# Patient Record
Sex: Female | Born: 1980
Health system: Southern US, Community
[De-identification: ages and names within clinical notes are randomized; demographics above are authoritative.]

## PROBLEM LIST (undated history)

## (undated) ENCOUNTER — Inpatient Hospital Stay (HOSPITAL_COMMUNITY): Payer: Self-pay

## (undated) DIAGNOSIS — K219 Gastro-esophageal reflux disease without esophagitis: Secondary | ICD-10-CM

## (undated) DIAGNOSIS — K140 Glossitis: Secondary | ICD-10-CM

## (undated) DIAGNOSIS — L271 Localized skin eruption due to drugs and medicaments taken internally: Secondary | ICD-10-CM

## (undated) DIAGNOSIS — Z9889 Other specified postprocedural states: Secondary | ICD-10-CM

## (undated) DIAGNOSIS — K760 Fatty (change of) liver, not elsewhere classified: Secondary | ICD-10-CM

## (undated) DIAGNOSIS — E039 Hypothyroidism, unspecified: Secondary | ICD-10-CM

## (undated) DIAGNOSIS — K5790 Diverticulosis of intestine, part unspecified, without perforation or abscess without bleeding: Secondary | ICD-10-CM

## (undated) DIAGNOSIS — K635 Polyp of colon: Secondary | ICD-10-CM

## (undated) DIAGNOSIS — R112 Nausea with vomiting, unspecified: Secondary | ICD-10-CM

## (undated) DIAGNOSIS — K222 Esophageal obstruction: Secondary | ICD-10-CM

## (undated) DIAGNOSIS — R590 Localized enlarged lymph nodes: Secondary | ICD-10-CM

## (undated) DIAGNOSIS — K449 Diaphragmatic hernia without obstruction or gangrene: Secondary | ICD-10-CM

## (undated) DIAGNOSIS — R000 Tachycardia, unspecified: Secondary | ICD-10-CM

## (undated) HISTORY — DX: Polyp of colon: K63.5

## (undated) HISTORY — DX: Diaphragmatic hernia without obstruction or gangrene: K44.9

## (undated) HISTORY — PX: TONGUE BIOPSY: SHX1075

## (undated) HISTORY — PX: CHOLECYSTECTOMY: SHX55

## (undated) HISTORY — PX: FOOT SURGERY: SHX648

## (undated) HISTORY — DX: Esophageal obstruction: K22.2

## (undated) HISTORY — PX: TONSILLECTOMY: SUR1361

## (undated) HISTORY — DX: Diverticulosis of intestine, part unspecified, without perforation or abscess without bleeding: K57.90

## (undated) HISTORY — DX: Hypothyroidism, unspecified: E03.9

## (undated) HISTORY — DX: Tachycardia, unspecified: R00.0

## (undated) HISTORY — DX: Localized enlarged lymph nodes: R59.0

---

## 2004-06-01 ENCOUNTER — Ambulatory Visit: Payer: Self-pay | Admitting: Family Medicine

## 2004-06-17 ENCOUNTER — Ambulatory Visit: Payer: Self-pay | Admitting: Cardiology

## 2004-09-30 ENCOUNTER — Ambulatory Visit: Payer: Self-pay | Admitting: Family Medicine

## 2004-10-04 ENCOUNTER — Ambulatory Visit: Payer: Self-pay | Admitting: Endocrinology

## 2004-10-07 ENCOUNTER — Ambulatory Visit (HOSPITAL_COMMUNITY): Admission: RE | Admit: 2004-10-07 | Discharge: 2004-10-07 | Payer: Self-pay | Admitting: Endocrinology

## 2004-10-11 ENCOUNTER — Encounter: Admission: RE | Admit: 2004-10-11 | Discharge: 2004-10-11 | Payer: Self-pay | Admitting: Family Medicine

## 2005-01-04 ENCOUNTER — Ambulatory Visit: Payer: Self-pay | Admitting: Family Medicine

## 2005-04-06 ENCOUNTER — Other Ambulatory Visit: Admission: RE | Admit: 2005-04-06 | Discharge: 2005-04-06 | Payer: Self-pay | Admitting: Gynecology

## 2005-05-09 ENCOUNTER — Encounter (INDEPENDENT_AMBULATORY_CARE_PROVIDER_SITE_OTHER): Payer: Self-pay | Admitting: *Deleted

## 2005-05-09 ENCOUNTER — Emergency Department (HOSPITAL_COMMUNITY): Admission: EM | Admit: 2005-05-09 | Discharge: 2005-05-09 | Payer: Self-pay | Admitting: Emergency Medicine

## 2005-05-16 ENCOUNTER — Ambulatory Visit: Payer: Self-pay | Admitting: Internal Medicine

## 2005-05-18 ENCOUNTER — Ambulatory Visit: Payer: Self-pay | Admitting: Internal Medicine

## 2005-05-23 ENCOUNTER — Ambulatory Visit (HOSPITAL_COMMUNITY): Admission: RE | Admit: 2005-05-23 | Discharge: 2005-05-23 | Payer: Self-pay | Admitting: Internal Medicine

## 2005-05-23 ENCOUNTER — Encounter (INDEPENDENT_AMBULATORY_CARE_PROVIDER_SITE_OTHER): Payer: Self-pay | Admitting: *Deleted

## 2005-05-30 ENCOUNTER — Ambulatory Visit: Payer: Self-pay | Admitting: Cardiology

## 2005-05-30 ENCOUNTER — Encounter (INDEPENDENT_AMBULATORY_CARE_PROVIDER_SITE_OTHER): Payer: Self-pay | Admitting: *Deleted

## 2005-07-27 ENCOUNTER — Ambulatory Visit: Payer: Self-pay | Admitting: Internal Medicine

## 2005-09-01 ENCOUNTER — Ambulatory Visit: Payer: Self-pay | Admitting: Cardiology

## 2005-09-05 ENCOUNTER — Encounter (INDEPENDENT_AMBULATORY_CARE_PROVIDER_SITE_OTHER): Payer: Self-pay | Admitting: *Deleted

## 2005-09-05 ENCOUNTER — Ambulatory Visit (HOSPITAL_COMMUNITY): Admission: RE | Admit: 2005-09-05 | Discharge: 2005-09-05 | Payer: Self-pay | Admitting: General Surgery

## 2006-03-23 ENCOUNTER — Ambulatory Visit: Payer: Self-pay | Admitting: Family Medicine

## 2006-04-26 ENCOUNTER — Ambulatory Visit: Payer: Self-pay | Admitting: Family Medicine

## 2006-05-12 ENCOUNTER — Emergency Department (HOSPITAL_COMMUNITY): Admission: AD | Admit: 2006-05-12 | Discharge: 2006-05-12 | Payer: Self-pay | Admitting: Family Medicine

## 2006-07-11 ENCOUNTER — Ambulatory Visit: Payer: Self-pay | Admitting: Family Medicine

## 2006-07-27 ENCOUNTER — Ambulatory Visit: Payer: Self-pay | Admitting: Endocrinology

## 2006-10-02 ENCOUNTER — Ambulatory Visit: Payer: Self-pay | Admitting: Family Medicine

## 2006-11-02 ENCOUNTER — Ambulatory Visit: Payer: Self-pay | Admitting: Family Medicine

## 2006-11-30 ENCOUNTER — Encounter (INDEPENDENT_AMBULATORY_CARE_PROVIDER_SITE_OTHER): Payer: Self-pay | Admitting: *Deleted

## 2006-11-30 ENCOUNTER — Emergency Department (HOSPITAL_COMMUNITY): Admission: EM | Admit: 2006-11-30 | Discharge: 2006-11-30 | Payer: Self-pay | Admitting: Family Medicine

## 2007-02-28 DIAGNOSIS — E039 Hypothyroidism, unspecified: Secondary | ICD-10-CM

## 2007-03-28 ENCOUNTER — Encounter: Payer: Self-pay | Admitting: *Deleted

## 2007-03-28 DIAGNOSIS — K219 Gastro-esophageal reflux disease without esophagitis: Secondary | ICD-10-CM | POA: Insufficient documentation

## 2007-03-28 DIAGNOSIS — R000 Tachycardia, unspecified: Secondary | ICD-10-CM | POA: Insufficient documentation

## 2007-04-15 ENCOUNTER — Ambulatory Visit: Payer: Self-pay | Admitting: Family Medicine

## 2007-08-25 ENCOUNTER — Emergency Department (HOSPITAL_COMMUNITY): Admission: EM | Admit: 2007-08-25 | Discharge: 2007-08-25 | Payer: Self-pay | Admitting: Emergency Medicine

## 2007-10-31 ENCOUNTER — Emergency Department (HOSPITAL_COMMUNITY): Admission: EM | Admit: 2007-10-31 | Discharge: 2007-10-31 | Payer: Self-pay | Admitting: Emergency Medicine

## 2008-04-13 ENCOUNTER — Encounter (INDEPENDENT_AMBULATORY_CARE_PROVIDER_SITE_OTHER): Payer: Self-pay | Admitting: *Deleted

## 2008-04-13 ENCOUNTER — Encounter: Admission: RE | Admit: 2008-04-13 | Discharge: 2008-04-13 | Payer: Self-pay | Admitting: Family Medicine

## 2009-02-09 ENCOUNTER — Ambulatory Visit: Payer: Self-pay | Admitting: Family Medicine

## 2009-02-12 LAB — CONVERTED CEMR LAB
Alkaline Phosphatase: 69 units/L (ref 39–117)
BUN: 10 mg/dL (ref 6–23)
Basophils Relative: 0.2 % (ref 0.0–3.0)
Bilirubin, Direct: 0.2 mg/dL (ref 0.0–0.3)
CO2: 26 meq/L (ref 19–32)
Calcium: 9.2 mg/dL (ref 8.4–10.5)
Chloride: 109 meq/L (ref 96–112)
Creatinine, Ser: 0.8 mg/dL (ref 0.4–1.2)
Eosinophils Relative: 2.6 % (ref 0.0–5.0)
GFR calc non Af Amer: 91.73 mL/min (ref >60)
Lymphs Abs: 1.6 10*3/uL (ref 0.7–4.0)
MCV: 91 fL (ref 78.0–100.0)
Monocytes Relative: 6.6 % (ref 3.0–12.0)
Neutro Abs: 5.2 10*3/uL (ref 1.4–7.7)
Sodium: 142 meq/L (ref 135–145)
TSH: 3.81 microintl units/mL (ref 0.35–5.50)
Total Bilirubin: 0.5 mg/dL (ref 0.3–1.2)

## 2009-03-19 ENCOUNTER — Telehealth: Payer: Self-pay | Admitting: Family Medicine

## 2009-04-19 ENCOUNTER — Telehealth: Payer: Self-pay | Admitting: Family Medicine

## 2009-05-12 ENCOUNTER — Emergency Department (HOSPITAL_COMMUNITY): Admission: EM | Admit: 2009-05-12 | Discharge: 2009-05-12 | Payer: Self-pay | Admitting: Family Medicine

## 2009-05-18 ENCOUNTER — Telehealth: Payer: Self-pay | Admitting: Family Medicine

## 2009-05-21 ENCOUNTER — Ambulatory Visit: Payer: Self-pay | Admitting: Internal Medicine

## 2009-05-21 DIAGNOSIS — R197 Diarrhea, unspecified: Secondary | ICD-10-CM | POA: Insufficient documentation

## 2009-05-21 DIAGNOSIS — R1084 Generalized abdominal pain: Secondary | ICD-10-CM

## 2009-05-21 DIAGNOSIS — K625 Hemorrhage of anus and rectum: Secondary | ICD-10-CM

## 2009-05-21 DIAGNOSIS — R1013 Epigastric pain: Secondary | ICD-10-CM | POA: Insufficient documentation

## 2009-05-21 LAB — CONVERTED CEMR LAB
IgA: 131 mg/dL (ref 68–378)
Tissue Transglutaminase Ab, IgA: 0.3 units (ref <7)

## 2009-06-08 ENCOUNTER — Ambulatory Visit: Payer: Self-pay | Admitting: Internal Medicine

## 2009-06-08 HISTORY — PX: COLONOSCOPY W/ POLYPECTOMY: SHX1380

## 2009-06-08 HISTORY — PX: UPPER GI ENDOSCOPY: SHX6162

## 2009-06-11 ENCOUNTER — Encounter: Payer: Self-pay | Admitting: Internal Medicine

## 2009-06-23 ENCOUNTER — Emergency Department (HOSPITAL_COMMUNITY): Admission: EM | Admit: 2009-06-23 | Discharge: 2009-06-23 | Payer: Self-pay | Admitting: Family Medicine

## 2009-07-08 ENCOUNTER — Ambulatory Visit: Payer: Self-pay | Admitting: Family Medicine

## 2009-07-08 DIAGNOSIS — R21 Rash and other nonspecific skin eruption: Secondary | ICD-10-CM | POA: Insufficient documentation

## 2009-07-08 DIAGNOSIS — R609 Edema, unspecified: Secondary | ICD-10-CM

## 2009-07-13 ENCOUNTER — Emergency Department (HOSPITAL_COMMUNITY): Admission: EM | Admit: 2009-07-13 | Discharge: 2009-07-13 | Payer: Self-pay | Admitting: Emergency Medicine

## 2009-07-14 LAB — CONVERTED CEMR LAB
Anti Nuclear Antibody(ANA): NEGATIVE
BUN: 8 mg/dL (ref 6–23)
Bilirubin, Direct: 0 mg/dL (ref 0.0–0.3)
CO2: 23 meq/L (ref 19–32)
Chloride: 110 meq/L (ref 96–112)
Eosinophils Absolute: 0.2 10*3/uL (ref 0.0–0.7)
HCT: 37.9 % (ref 36.0–46.0)
Lymphs Abs: 2 10*3/uL (ref 0.7–4.0)
MCV: 88.8 fL (ref 78.0–100.0)
Monocytes Absolute: 0.7 10*3/uL (ref 0.1–1.0)
Neutrophils Relative %: 68.1 % (ref 43.0–77.0)
Potassium: 3.4 meq/L — ABNORMAL LOW (ref 3.5–5.1)
RDW: 13 % (ref 11.5–14.6)
Sed Rate: 15 mm/hr (ref 0–22)
Sodium: 141 meq/L (ref 135–145)
TSH: 2.71 microintl units/mL (ref 0.35–5.50)
Total Bilirubin: 0.6 mg/dL (ref 0.3–1.2)
Total Protein: 7.1 g/dL (ref 6.0–8.3)
ds DNA Ab: <1 (ref <5)

## 2009-07-20 ENCOUNTER — Ambulatory Visit: Payer: Self-pay | Admitting: Family Medicine

## 2009-07-20 DIAGNOSIS — E538 Deficiency of other specified B group vitamins: Secondary | ICD-10-CM | POA: Insufficient documentation

## 2009-07-21 ENCOUNTER — Ambulatory Visit: Payer: Self-pay | Admitting: Internal Medicine

## 2009-07-21 DIAGNOSIS — K5730 Diverticulosis of large intestine without perforation or abscess without bleeding: Secondary | ICD-10-CM | POA: Insufficient documentation

## 2009-07-21 DIAGNOSIS — K222 Esophageal obstruction: Secondary | ICD-10-CM

## 2009-07-21 DIAGNOSIS — K589 Irritable bowel syndrome without diarrhea: Secondary | ICD-10-CM

## 2009-07-30 ENCOUNTER — Emergency Department (HOSPITAL_COMMUNITY): Admission: EM | Admit: 2009-07-30 | Discharge: 2009-07-30 | Payer: Self-pay | Admitting: Emergency Medicine

## 2009-08-16 ENCOUNTER — Emergency Department (HOSPITAL_COMMUNITY): Admission: EM | Admit: 2009-08-16 | Discharge: 2009-08-16 | Payer: Self-pay | Admitting: Family Medicine

## 2009-09-02 DIAGNOSIS — R002 Palpitations: Secondary | ICD-10-CM

## 2009-09-03 ENCOUNTER — Ambulatory Visit: Payer: Self-pay

## 2009-09-03 ENCOUNTER — Ambulatory Visit: Payer: Self-pay | Admitting: Cardiology

## 2009-09-03 DIAGNOSIS — R0602 Shortness of breath: Secondary | ICD-10-CM | POA: Insufficient documentation

## 2009-09-20 ENCOUNTER — Telehealth: Payer: Self-pay | Admitting: Cardiology

## 2009-10-04 ENCOUNTER — Encounter: Payer: Self-pay | Admitting: Cardiology

## 2009-10-04 ENCOUNTER — Ambulatory Visit: Payer: Self-pay | Admitting: Internal Medicine

## 2009-10-04 ENCOUNTER — Ambulatory Visit: Payer: Self-pay

## 2009-10-04 ENCOUNTER — Ambulatory Visit (HOSPITAL_COMMUNITY): Admission: RE | Admit: 2009-10-04 | Discharge: 2009-10-04 | Payer: Self-pay | Admitting: Cardiology

## 2009-10-11 ENCOUNTER — Ambulatory Visit: Payer: Self-pay | Admitting: Cardiology

## 2009-10-11 DIAGNOSIS — R0989 Other specified symptoms and signs involving the circulatory and respiratory systems: Secondary | ICD-10-CM

## 2009-10-11 DIAGNOSIS — R0609 Other forms of dyspnea: Secondary | ICD-10-CM

## 2009-10-18 ENCOUNTER — Telehealth: Payer: Self-pay | Admitting: Cardiology

## 2009-10-19 ENCOUNTER — Encounter (INDEPENDENT_AMBULATORY_CARE_PROVIDER_SITE_OTHER): Payer: Self-pay | Admitting: *Deleted

## 2009-11-05 ENCOUNTER — Encounter (INDEPENDENT_AMBULATORY_CARE_PROVIDER_SITE_OTHER): Payer: Self-pay | Admitting: *Deleted

## 2009-11-30 ENCOUNTER — Ambulatory Visit: Payer: Self-pay | Admitting: Cardiology

## 2009-11-30 ENCOUNTER — Ambulatory Visit (HOSPITAL_COMMUNITY): Admission: RE | Admit: 2009-11-30 | Discharge: 2009-11-30 | Payer: Self-pay | Admitting: Cardiology

## 2009-12-03 ENCOUNTER — Telehealth: Payer: Self-pay | Admitting: Cardiology

## 2009-12-08 ENCOUNTER — Telehealth: Payer: Self-pay | Admitting: Internal Medicine

## 2010-01-24 ENCOUNTER — Ambulatory Visit: Payer: Self-pay | Admitting: Family Medicine

## 2010-01-25 LAB — CONVERTED CEMR LAB
ALT: 50 units/L — ABNORMAL HIGH (ref 0–35)
Albumin: 4 g/dL (ref 3.5–5.2)
Basophils Absolute: 0.1 10*3/uL (ref 0.0–0.1)
CO2: 26 meq/L (ref 19–32)
Calcium: 8.9 mg/dL (ref 8.4–10.5)
Chloride: 105 meq/L (ref 96–112)
Cholesterol: 270 mg/dL — ABNORMAL HIGH (ref 0–200)
Creatinine, Ser: 0.9 mg/dL (ref 0.4–1.2)
Eosinophils Absolute: 0.2 10*3/uL (ref 0.0–0.7)
GFR calc non Af Amer: 81.59 mL/min (ref >60)
Glucose, Bld: 98 mg/dL (ref 70–99)
HCT: 44.1 % (ref 36.0–46.0)
Hemoglobin: 15.4 g/dL — ABNORMAL HIGH (ref 12.0–15.0)
Lymphocytes Relative: 32 % (ref 12.0–46.0)
Lymphs Abs: 2.5 10*3/uL (ref 0.7–4.0)
Neutro Abs: 4.3 10*3/uL (ref 1.4–7.7)
Neutrophils Relative %: 55.7 % (ref 43.0–77.0)
Potassium: 4 meq/L (ref 3.5–5.1)
RBC: 5.02 M/uL (ref 3.87–5.11)
Sodium: 138 meq/L (ref 135–145)
Total Bilirubin: 0.4 mg/dL (ref 0.3–1.2)
Total CHOL/HDL Ratio: 5
Vitamin B-12: 519 pg/mL (ref 211–911)

## 2010-05-10 ENCOUNTER — Telehealth: Payer: Self-pay | Admitting: Family Medicine

## 2010-07-24 ENCOUNTER — Encounter: Payer: Self-pay | Admitting: Cardiology

## 2010-07-24 ENCOUNTER — Encounter: Payer: Self-pay | Admitting: Endocrinology

## 2010-07-24 ENCOUNTER — Encounter: Payer: Self-pay | Admitting: Family Medicine

## 2010-07-31 LAB — CONVERTED CEMR LAB
BUN: 13 mg/dL (ref 6–23)
Chloride: 105 meq/L (ref 96–112)
Creatinine, Ser: 0.82 mg/dL (ref 0.40–1.20)
Potassium: 3.9 meq/L (ref 3.5–5.3)
Sodium: 140 meq/L (ref 135–145)

## 2010-08-02 NOTE — Assessment & Plan Note (Signed)
Summary: leg pain with rash/ccm   Vital Signs:  Patient profile:   30 year old female Temp:     97.8 degrees F oral Pulse rate:   84 / minute BP sitting:   122 / 80  (left arm) Cuff size:   large  Vitals Entered By: Alfred Levins, CMA (July 08, 2009 9:27 AM)  Contraindications/Deferment of Procedures/Staging:    Test/Procedure: Weight Refused    Reason for deferment: patient declined-cannot calculate BMI  CC: rash on lower legs that is swollen and painful   Current Medications (verified): 1)  Levothyroxine Sodium 100 Mcg Tabs (Levothyroxine Sodium) .... Once Daily 2)  Bcp .Marland Kitchen.. 1 By Mouth Once Daily 3)  Fexofenadine Hcl 180 Mg Tabs (Fexofenadine Hcl) .Marland Kitchen.. 1 Once Daily As Needed Allergies 4)  Cardizem Cd 240 Mg Xr24h-Cap (Diltiazem Hcl Coated Beads) .... Once Daily 5)  Metoprolol Succinate 25 Mg Xr24h-Tab (Metoprolol Succinate) .... Once Daily 6)  Omeprazole 40 Mg Cpdr (Omeprazole) .... Once Daily 7)  Temazepam 30 Mg Caps (Temazepam) .Marland Kitchen.. 1 At Bedtime Prn  Allergies (verified): 1)  ! Nexium (Esomeprazole Magnesium)   Impression & Recommendations:  Problem # 1:  EDEMA (ICD-782.3)  Her updated medication list for this problem includes:    Furosemide 40 Mg Tabs (Furosemide) ..... Once daily  Orders: Venipuncture ([address]) TLB-BMP (Basic Metabolic Panel-BMET) (80048-METABOL) TLB-CBC Platelet - w/Differential (85025-CBCD) TLB-Hepatic/Liver Function Pnl (80076-HEPATIC) TLB-TSH (Thyroid Stimulating Hormone) (84443-TSH) TLB-BNP (B-Natriuretic Peptide) (83880-BNPR) TLB-B12, Serum-Total ONLY (60454-U98) T-Antinuclear Antib (ANA) [phone]) T- * Misc. Laboratory test [phone]) Sedimentation Rate, non-automated (86578) TLB-Sedimentation Rate (ESR) (85652-ESR) Dermatology Referral (Derma)  Problem # 2:  TACHYCARDIA (ICD-785.0)  Problem # 3:  HYPOTHYROIDISM (ICD-244.9)  Her updated medication list for this problem includes:    Levothyroxine Sodium 100 Mcg Tabs  (Levothyroxine sodium) ..... Once daily  Problem # 4:  SKIN RASH (ICD-782.1)  Complete Medication List: 1)  Levothyroxine Sodium 100 Mcg Tabs (Levothyroxine sodium) .... Once daily 2)  Bcp  .Marland Kitchen.. 1 by mouth once daily 3)  Fexofenadine Hcl 180 Mg Tabs (Fexofenadine hcl) .Marland Kitchen.. 1 once daily as needed allergies 4)  Omeprazole 40 Mg Cpdr (Omeprazole) .... Once daily 5)  Temazepam 30 Mg Caps (Temazepam) .Marland Kitchen.. 1 at bedtime prn 6)  Toprol Xl 100 Mg Xr24h-tab (Metoprolol succinate) .... Once daily 7)  Furosemide 40 Mg Tabs (Furosemide) .... Once daily  Patient Instructions: 1)  I think the rash on her legs is simply petechial from the swelling, but she is very concerned about the possibility of lupus. We will check labs today, including a dsDNA, and refer her to Dermatology. Try to reduce the edema by stopping her calcium channel blocker and adding Lasix. Increase the Metroprolol so her heart rate will not shoot up. Elevate the legs when possible. Prescriptions: FUROSEMIDE 40 MG TABS (FUROSEMIDE) once daily  #30 x 5   Entered and Authorized by:   Nelwyn Salisbury MD   Signed by:   Nelwyn Salisbury MD on 07/08/2009   Method used:   [facility] [facility]       [address]       [place], [place]  [address]       Ph: [phone] or [phone]       Fax: [phone]   RxID:   [id number] TOPROL XL 100 MG XR24H-TAB (METOPROLOL SUCCINATE) once daily  #30 x 5   Entered and Authorized by:   Jeannett Senior  Marguerita Beards MD   Signed by:   Nelwyn Salisbury MD on 07/08/2009   Method used:   [facility]        [facility]  [address] (retail)       [address]       [place], [place]  [address]       Ph: [phone] or [phone]       Fax: [phone]   RxID:   [id number]

## 2010-08-02 NOTE — Letter (Signed)
Summary: Appointment - Cardiac MRI  [facility]     [place], [place]    Phone:   Fax:       October 19, 2009 MRN: [id number]   [facility] [address] [place], [place]  [address]   Dear Ms. Furnari,   We have scheduled the above patient for an appointment for a Cardiac MRI on April 27,2011 at 1:00 p.m.  Please refer to the below information for the location and instructions for this test:  Location:     [facility]       [address]       [place], [place]  [address] Instructions:    Wilmon Arms at [facility] Outpatient Registration 45 minutes prior to your appointment time.  This will ensure you are in the Radiology Department 30 minutes prior to your appointment.    There are no restrictions for this test you may eat and take medications as usual.  If you need to reschedule this appointment please call at the number listed above.   Sincerely,      Lorne Skeens  [facility] Scheduling Team

## 2010-08-02 NOTE — Assessment & Plan Note (Signed)
Summary: MED CK (REFILL) // RS   Vital Signs:  Patient profile:   30 year old female Pulse rate:   88 / minute Pulse rhythm:   regular BP sitting:   108 / 90  (left arm) Cuff size:   large  Vitals Entered By: Raechel Ache, RN (January 24, 2010 9:57 AM) CC: Med check.   History of Present Illness: Here to follow up low b12 and hypothyroidism. She feels good in general. She has lost 12 lbs. No complaints.   Allergies: 1)  ! Nexium (Esomeprazole Magnesium)  Past History:  Past Medical History: 1. PALPITATIONS (ICD-785.1):sees Dr. Marca Ancona.  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  She says that she had an EP study in [place] that was normal.  Holter monitor (3/11) on Toprol XL showed no significant arrhythmic events.  HR ranged 65-108 with mean 79.  2. DIVERTICULOSIS-COLON (ICD-562.10) 3. IRRITABLE BOWEL SYNDROME (ICD-564.1), sees Dr. Marina Goodell 4. VITAMIN B12 DEFICIENCY (ICD-266.2) 5. EDEMA (ICD-782.3) 6. GERD (ICD-530.81) 7. HYPOTHYROIDISM (ICD-244.9) 8. Dyspnea: Echo (4/11) was a difficult study due to body habitus but showed preserved LV systolic function, EF 55%, grade I diastolic dysfunction, possible inferior, inferoseptal, and apical hypokinesis. 9. Obesity    Review of Systems  The patient denies anorexia, fever, weight gain, vision loss, decreased hearing, hoarseness, chest pain, syncope, dyspnea on exertion, peripheral edema, prolonged cough, headaches, hemoptysis, abdominal pain, melena, hematochezia, severe indigestion/heartburn, hematuria, incontinence, genital sores, muscle weakness, suspicious skin lesions, transient blindness, difficulty walking, depression, unusual weight change, abnormal bleeding, enlarged lymph nodes, angioedema, breast masses, and testicular masses.    Physical Exam  General:  overweight-appearing.   Neck:  No deformities, masses, or tenderness noted. Lungs:  Normal  respiratory effort, chest expands symmetrically. Lungs are clear to auscultation, no crackles or wheezes. Heart:  Normal rate and regular rhythm. S1 and S2 normal without gallop, murmur, click, rub or other extra sounds.   Impression & Recommendations:  Problem # 1:  PALPITATIONS (ICD-785.1)  Her updated medication list for this problem includes:    Toprol Xl 100 Mg Xr24h-tab (Metoprolol succinate) ..... Once daily  Problem # 2:  VITAMIN B12 DEFICIENCY (ICD-266.2)  Orders: TLB-B12, Serum-Total ONLY ([address]-U04)  Problem # 3:  EDEMA (ICD-782.3)  The following medications were removed from the medication list:    Furosemide 40 Mg Tabs (Furosemide) ..... Once daily  Orders: Venipuncture ([address]) TLB-Lipid Panel (80061-LIPID) TLB-CBC Platelet - w/Differential (85025-CBCD) TLB-Hepatic/Liver Function Pnl (80076-HEPATIC) TLB-TSH (Thyroid Stimulating Hormone) (84443-TSH) TLB-BMP (Basic Metabolic Panel-BMET) (80048-METABOL)  Problem # 4:  HYPOTHYROIDISM (ICD-244.9)  Her updated medication list for this problem includes:    Levothyroxine Sodium 100 Mcg Tabs (Levothyroxine sodium) ..... Once daily  Complete Medication List: 1)  Levothyroxine Sodium 100 Mcg Tabs (Levothyroxine sodium) .... Once daily 2)  Fexofenadine Hcl 180 Mg Tabs (Fexofenadine hcl) .Marland Kitchen.. 1 once daily as needed allergies 3)  Omeprazole 40 Mg Cpdr (Omeprazole) .... Once daily 4)  Temazepam 30 Mg Caps (Temazepam) .Marland Kitchen.. 1 at bedtime prn 5)  Toprol Xl 100 Mg Xr24h-tab (Metoprolol succinate) .... Once daily 6)  Cyanocobalamin 1000 Mcg/ml Soln (Cyanocobalamin) .Marland Kitchen.. 1 ml intramuscular weekly for 3 mths then ov 7)  Librax 2.5-5 Mg Caps (Clidinium-chlordiazepoxide) .Marland Kitchen.. 1 by mouth ac and at bedtime as needed  Patient Instructions: 1)  Get fasting labs today Prescriptions: TOPROL XL 100 MG XR24H-TAB (METOPROLOL SUCCINATE) once daily  #90 x 3   Entered and Authorized  by:   Nelwyn Salisbury MD   Signed by:   Nelwyn Salisbury MD on  01/24/2010   Method used:   Print then Give to Patient   RxID:   [id number] TEMAZEPAM 30 MG CAPS (TEMAZEPAM) 1 at bedtime prn  #90 x 1   Entered and Authorized by:   Nelwyn Salisbury MD   Signed by:   Nelwyn Salisbury MD on 01/24/2010   Method used:   Print then Give to Patient   RxID:   [id number] OMEPRAZOLE 40 MG CPDR (OMEPRAZOLE) once daily  #90 x 3   Entered and Authorized by:   Nelwyn Salisbury MD   Signed by:   Nelwyn Salisbury MD on 01/24/2010   Method used:   Print then Give to Patient   RxID:   [id number] FEXOFENADINE HCL 180 MG TABS (FEXOFENADINE HCL) 1 once daily as needed allergies  #90 x 3   Entered and Authorized by:   Nelwyn Salisbury MD   Signed by:   Nelwyn Salisbury MD on 01/24/2010   Method used:   Print then Give to Patient   RxID:   [id number] LEVOTHYROXINE SODIUM 100 MCG TABS (LEVOTHYROXINE SODIUM) once daily  #90 x 3   Entered and Authorized by:   Nelwyn Salisbury MD   Signed by:   Nelwyn Salisbury MD on 01/24/2010   Method used:   Print then Give to Patient   RxID:   [id number]

## 2010-08-02 NOTE — Progress Notes (Signed)
Summary: PT RETURNING CALL   Phone Note Call from Patient Call back at [phone]   Caller: Patient Summary of Call: PT RETURNING CALL Initial call taken by: Judie Grieve,  September 20, 2009 8:05 AM  Follow-up for Phone Call        gave pt monitor results from 09-03-09

## 2010-08-02 NOTE — Procedures (Signed)
Summary: SUMMARY REPORT  SUMMARY REPORT   Imported By: Mirna Mires 09/20/2009 10:07:01  _____________________________________________________________________  External Attachment:    Type:   Image     Comment:   External Document

## 2010-08-02 NOTE — Assessment & Plan Note (Signed)
Summary: FOLLOWUP post ECL    History of Present Illness Visit Type: follow up Primary GI MD: Yancey Flemings MD Primary Provider: Gershon Crane, MD  Requesting Provider: n/a Chief Complaint: F/u endo/colon History of Present Illness:   30 year old female with hypothyroidism, unspecified arrhythmia for which she is on medical therapy, chronic allergies, tension headaches, GERD, and obesity. She was evaluated May 21, 2009 for nausea vomiting, bloating, cramping, and diarrhea. See that dictation. Serologic testing for celiac sprue was negative. Complete colonoscopy and upper endoscopy were performed December 7. Colonoscopy revealed diverticulosis. The colonic mucosa was normal. The ileum was normal. Random biopsies of the colon were normal. A diminutive colon polyp was hyperplastic. Upper endoscopy revealed a benign distal esophageal stricture and a small hiatal hernia. No other abnormalities. She presents today for followup here at review of outside laboratories from January 2011 revealed a normal CBC. Her hemoglobin was normal. Thyroid testing normal. Liver tests normal. However, vitamin B12 level was low. She is now on B12 replacement. Sedimentation rate normal. Overall she feels about the same. She does report some vague nausea and decreased appetite which are new over the past few weeks. No new problems additionally.   GI Review of Systems    Reports abdominal pain, belching, loss of appetite, nausea, and  vomiting.     Location of  Abdominal pain: cramping.    Denies acid reflux, bloating, chest pain, dysphagia with liquids, dysphagia with solids, heartburn, vomiting blood, weight loss, and  weight gain.      Reports diarrhea and  diverticulosis.     Denies anal fissure, black tarry stools, change in bowel habit, constipation, fecal incontinence, heme positive stool, hemorrhoids, irritable bowel syndrome, jaundice, light color stool, liver problems, rectal bleeding, and  rectal pain.     Current Medications (verified): 1)  Levothyroxine Sodium 100 Mcg Tabs (Levothyroxine Sodium) .... Once Daily 2)  Bcp .Marland Kitchen.. 1 By Mouth Once Daily 3)  Fexofenadine Hcl 180 Mg Tabs (Fexofenadine Hcl) .Marland Kitchen.. 1 Once Daily As Needed Allergies 4)  Omeprazole 40 Mg Cpdr (Omeprazole) .... Once Daily 5)  Temazepam 30 Mg Caps (Temazepam) .Marland Kitchen.. 1 At Bedtime Prn 6)  Toprol Xl 100 Mg Xr24h-Tab (Metoprolol Succinate) .... Once Daily 7)  Furosemide 40 Mg Tabs (Furosemide) .... Once Daily 8)  Cyanocobalamin 1000 Mcg/ml Soln (Cyanocobalamin) .Marland Kitchen.. 1 Ml Intramuscular Weekly For 3 Mths Then Ov  Allergies (verified): 1)  ! Nexium (Esomeprazole Magnesium)  Past History:  Past Medical History: Reviewed history from 02/09/2009 and no changes required. Hypothyroidism GERD chronic sinus tachycardia, sees Dr. Irena Cords for cardiology exams. Had Holter monitor 2009 (sinus tach only)         and ECHO 2009 (normal except trace mitral regurgitation)  tension HAs sees [facility] for exams  Past Surgical History: Reviewed history from 03/28/2007 and no changes required. Cholecystectomy  Family History: Reviewed history from 05/21/2009 and no changes required. Family History of CAD Female 1st degree relative <50 Family History High cholesterol Family History Hypertension Family History Thyroid disease Family History of Colon Cancer:MGGF Family History of Colon Polyps:Mother, MGM, MGGF Family History of Heart Disease: Mother, MGF  Social History: Reviewed history from 05/21/2009 and no changes required. Single No childern  Never Smoked Alcohol use-no Daily Caffeine Use: One daily  Occupation: RN at [employer] Illicit Drug Use - no Patient does not get regular exercise.   Review of Systems       unchanged from previous office visit.  Vital Signs:  Patient  profile:   30 year old female Height:      71 inches Pulse rate:   82 / minute Pulse rhythm:   regular BP sitting:   124 / 80  (left  arm) Cuff size:   large  Vitals Entered By: Ok Anis CMA (July 21, 2009 11:14 AM)  Physical Exam  General:  Well developed, well nourished, no acute distress. Head:  Normocephalic and atraumatic. Eyes:  PERRLA, no icterus. Ears:  Normal auditory acuity. Nose:  No deformity, discharge,  or lesions. Mouth:  No deformity or lesions, dentition normal. Lungs:  Clear throughout to auscultation. Heart:  Regular rate and rhythm; no murmurs, rubs,  or bruits. Abdomen:  Soft, obese, nontender and nondistended. No masses, hepatosplenomegaly or hernias noted. Normal bowel sounds. Msk:  Symmetrical with no gross deformities. Normal posture. Pulses:  Normal pulses noted. Extremities:  no edema Neurologic:  Alert and  oriented x4;  Skin:  Intact without significant lesions or rashes. Psych:  Alert and cooperative. Normal mood and affect.   Impression & Recommendations:  Problem # 1:  IRRITABLE BOWEL SYNDROME (ICD-564.1) The patient's chronic GI complaints are most likely a form of irritable bowel syndrome. Her B12 deficiency, without anemia, is interesting. No evidence of small bowel pathology grossly on recent endoscopy.  Plan: #1. Prescribed Librax one p.o. a.c. and h.s. p.r.n. #2.. Office followup in 6 weeks #3. Could consider more extensive small bowel evaluation or empiric trial of broad-spectrum antibiotics or pancreatic enzymes if symptoms persist (this given explain B12 deficiency).  Problem # 2:  DIVERTICULOSIS-COLON (ICD-562.10) newly diagnosed diverticulosis. She may have some lower intestinal spasm.  Plan: #1. High-fiber diet  Problem # 3:  GERD (ICD-530.81) ongoing. Classic GERD symptoms controlled with omeprazole.  Plan: #1. Continue omeprazole #2. Reflux precautions with attention to weight loss  Problem # 4:  ESOPHAGEAL STRICTURE (ICD-530.3) newly diagnosed esophageal stricture on recent endoscopy. Currently asymptomatic.  Plan #1. Continue omeprazole #2.  Esophageal dilation p.rn. If dysphagia occurs  Problem # 5:  VITAMIN B12 DEFICIENCY (ICD-266.2) Assessment: Comment Only  Weight was not documented today as patient declined; BMI could not be calculated.  Patient Instructions: 1)  Generic Librax sent to pharmacy #100 1 by mouth AC and at bedtime as needed  3 RFs. 2)  Please schedule a follow-up appointment in  6 weeks.  3)  The medication list was reviewed and reconciled.  All changed / newly prescribed medications were explained.  A complete medication list was provided to the patient / caregiver. 4)  Copy: Dr. Shellia Carwin Prescriptions: LIBRAX 2.5-5 MG CAPS (CLIDINIUM-CHLORDIAZEPOXIDE) 1 by mouth AC and at bedtime as needed  #100 x 3   Entered by:   Milford Cage NCMA   Authorized by:   Hilarie Fredrickson MD   Signed by:   Milford Cage NCMA on 07/21/2009   Method used:   [facility]  [address] [facility]       [address]       [place], [place]  [address]       Ph: [phone] or [phone]       Fax: [phone]   RxID:   [id number]

## 2010-08-02 NOTE — Progress Notes (Signed)
Summary: thyroid questions  Phone Note Call from Patient Call back at Home Phone [phone]   Caller: Patient Call For: Nelwyn Salisbury MD Summary of Call: TSH 1.1 at [facility] Needs instructions on meds. [facility] Initial call taken by: [facility] CMA AAMA,  May 10, 2010 3:29 PM  Follow-up for Phone Call        her Synthroid level is now exactly right. Continue current dose and recheck in 6 months  Follow-up by: Nelwyn Salisbury MD,  May 10, 2010 4:36 PM  Additional Follow-up for Phone Call Additional follow up Details #1::        left mess to return call  Additional Follow-up by: Pura Spice, RN,  May 10, 2010 4:39 PM     Appended Document: thyroid questions pt called above info given

## 2010-08-02 NOTE — Progress Notes (Signed)
Summary: pt not snoring at night 10-18-09   Phone Note Call from Patient Call back at [facility] Phone [phone]   Caller: Patient Reason for Call: Talk to Nurse Summary of Call: PT NOT SNORING AT NIGHT.  Initial call taken by: Lorne Skeens,  October 18, 2009 12:05 PM  Follow-up for Phone Call        LM for pt to call Katina Dung, RN, BSN  October 18, 2009 12:08 PM  talked with patient--pt's mother did not observe any snoring or breathing problems at night  or other indication for sleep apnea--will forward to Dr Shirlee Latch

## 2010-08-02 NOTE — Assessment & Plan Note (Signed)
Summary: B12 INJ // RS  Medications Added CYANOCOBALAMIN 1000 MCG/ML SOLN (CYANOCOBALAMIN) 1 mL intramuscular weekly for 3 mths then ov       Nurse Visit   Allergies: 1)  ! Nexium (Esomeprazole Magnesium)  Medication Administration  Injection # 1:    Medication: Vit B12 1000 mcg    Diagnosis: VITAMIN B12 DEFICIENCY (ICD-266.2)    Route: IM    Site: L deltoid    Exp Date: 4/12    Lot #: [id number]    Mfr: [employer]    Patient tolerated injection without complications    Given by: Alfred Levins, CMA (July 20, 2009 9:18 AM)  Orders Added: 1)  Vit B12 1000 mcg [J3420] 2)  Admin of Therapeutic Inj  intramuscular or subcutaneous [96372]  Prescriptions: CYANOCOBALAMIN 1000 MCG/ML SOLN (CYANOCOBALAMIN) 1 mL intramuscular weekly for 3 mths then ov  #1 x 3   Entered by:   Alfred Levins, CMA   Authorized by:   Nelwyn Salisbury MD   Signed by:   Alfred Levins, CMA on 07/20/2009   Method used:   [facility]        [facility]  [address]       [address]       [place], [place]  [address]       Ph: [phone] or [phone]       Fax: [phone]   RxID:   [id number]

## 2010-08-02 NOTE — Assessment & Plan Note (Signed)
Summary: f3w/post echo/lg  Medications Added TOPROL XL 100 MG XR24H-TAB (METOPROLOL SUCCINATE) one-half tablet twice a day      Allergies Added:   Referring Provider:  n/a Primary Provider:  Gershon Crane, MD   CC:  follow up on echo.  Pt states she is feeling pretty good but still having the SOB.  Pt. refuses weighing today.  Home weight this AM 260lbs.  Pt denies fluid on ankles or legs.  History of Present Illness: 30 yo with history of sinus tachycardia presents for evaluation of palpitations, shortness of breath, and leg edema.  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  She says that she had an EP study in [place] in 2003 that apparently was normal.  She was talking diltiazem CD in the past but developed significant leg swelling so this was stopped and she was begun on Toprol XL instead.  Swelling improved off diltiazem but was still significant so she has been taking Lasix.  If she stops Lasix, the swelling returns.  Lately, she has been having more fluttering and extra beats.  This occurs daily.  When the palpitations occur, she becomes short of breath.  She has had a lot of trouble with dyspnea.  Sometimes she is short of breath at rest.  It does not take much exertion to wear her out. A flight of steps gets her significantly out of breath.  No orthopnea or PND, though sometimes she will wake up with fluttering in her chest.  No lightheadedness or syncope.  She has generalized fatigue.   Holter monitor in 3/11 showed no significant arrhythmic events and average heart rate of 79. Echo was a difficult study likely due to body habitus. EF appeared grossly preserved but there was a question of possible inferior and inferoseptal wall motion abnormalities. Unable to estimate PA pressure. She also notes that her BP has been running lower since increasing Toprol XL.  It was once as low as 80/40. Finally, patient's mother says that she snores. She has not noted  her stop breathing or gasp in her sleep but she has not observed closely.   Labs (1/11): ANA negative, K 3.4, creatinine 1.0, BNP 25, TSH normal, ESR 15 (normal) Labs (3/11): K 3.9, creatinine 0.82  ECG: NSR, borderline left atrial enlargement.   Current Medications (verified): 1)  Levothyroxine Sodium 100 Mcg Tabs (Levothyroxine Sodium) .... Once Daily 2)  Bcp .Marland Kitchen.. 1 By Mouth Once Daily 3)  Fexofenadine Hcl 180 Mg Tabs (Fexofenadine Hcl) .Marland Kitchen.. 1 Once Daily As Needed Allergies 4)  Omeprazole 40 Mg Cpdr (Omeprazole) .... Once Daily 5)  Temazepam 30 Mg Caps (Temazepam) .Marland Kitchen.. 1 At Bedtime Prn 6)  Toprol Xl 100 Mg Xr24h-Tab (Metoprolol Succinate) .... One  Daily in The Morning and One-Half in The Evening 7)  Furosemide 40 Mg Tabs (Furosemide) .... Once Daily 8)  Cyanocobalamin 1000 Mcg/ml Soln (Cyanocobalamin) .Marland Kitchen.. 1 Ml Intramuscular Weekly For 3 Mths Then Ov 9)  Librax 2.5-5 Mg Caps (Clidinium-Chlordiazepoxide) .Marland Kitchen.. 1 By Mouth Ac and At Bedtime As Needed 10)  Potassium Chloride Crys Cr 20 Meq Cr-Tabs (Potassium Chloride Crys Cr) .... Take One Tablet By Mouth Daily  Allergies (verified): 1)  ! Nexium (Esomeprazole Magnesium)  Past History:  Past Medical History: 1. 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  She says that she had an EP study in [place] that was normal.  Holter monitor (3/11) on  Toprol XL showed no significant arrhythmic events.  HR ranged 65-108 with mean 79.  2. DIVERTICULOSIS-COLON (ICD-562.10) 3. IRRITABLE BOWEL SYNDROME (ICD-564.1) 4. VITAMIN B12 DEFICIENCY (ICD-266.2) 5. EDEMA (ICD-782.3) 6. GERD (ICD-530.81) 7. HYPOTHYROIDISM (ICD-244.9) 8. Dyspnea: Echo (4/11) was a difficult study due to body habitus but showed preserved LV systolic function, EF 55%, grade I diastolic dysfunction, possible inferior, inferoseptal, and apical hypokinesis. 9. Obesity    Family  History: Family History of CAD Female 1st degree relative <50, also grandmother had an MI in her mid-40s.  Family History High cholesterol Family History Hypertension Family History Thyroid disease Family History of Colon Cancer:MGGF Family History of Colon Polyps:Mother, MGM, MGGF Family History of Heart Disease: Mother, MGF  Social History: Reviewed history from 09/03/2009 and no changes required. 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   Review of Systems       All systems reviewed and negative except as per HPI.   Vital Signs:  Patient profile:   30 year old female Height:      71 inches Weight:      260 pounds BMI:     36.39 Pulse rate:   76 / minute Pulse rhythm:   regular BP sitting:   104 / 66  (left arm) Cuff size:   large  Vitals Entered By: Judithe Modest CMA (October 11, 2009 10:50 AM)  Physical Exam  General:  Well developed, well nourished, in no acute distress. Neck:  Neck thick, no JVD. No masses, thyromegaly or abnormal cervical nodes. Lungs:  Clear bilaterally to auscultation and percussion. Heart:  Non-displaced PMI, chest non-tender; regular rate and rhythm, S1, S2 without murmurs, rubs or gallops. Carotid upstroke normal, no bruit. Pedals normal pulses. No edema, no varicosities. Abdomen:  Bowel sounds positive; abdomen soft and non-tender without masses, organomegaly, or hernias noted. No hepatosplenomegaly. Extremities:  No clubbing or cyanosis. Neurologic:  Alert and oriented x 3. Psych:  Normal affect.   Impression & Recommendations:  Problem # 1:  PALPITATIONS (ICD-785.1) No significant arrhythmic events on holter, minimal sinus tachycardia.  Given occasional hypotension, I will have her decrease Toprol XL to 50 mg two times a day.   Problem # 2:  DYSPNEA ON EXERTION (ICD-786.09) Patient  has significant exertional dyspnea and generalized fatigue.  Some of this could certainly be due to obesity and deconditioning.  I also have some concern for OSA.  Echo was a difficult study due to body habitus which showed overall preserved EF but there was some question of inferior and apical wall motion abnormalities.  Unable to estimate PA systolic pressure.   - Mother is an RT, will watch her during her sleep one night to see if she gasps/stops breathing.  If she seems to have signs of significant obstruction, I will get a sleep study.  - I will get a cardiac MRI to better visualize the heart.  I will assess for LV wall motion abnormalities and will also assess RV size and function.  I will do the study of contrast so that we will be able to look for myocardial delayed enhancement in the event that she has true wall motion abnormalities.   - I will assess results of cardiac MRI.  I will probably get a right heart catheterization to confirm left and right heart filling pressures and to  look for pulmonary hypertension as a cause of her dyspnea.    Other Orders: Cardiac MRI (Cardiac MRI)  Patient Instructions: 1)  Your physician has recommended you make the following change in your medication:  2)  Decrease Toprol XL to 50mg  twice a day 3)  Your physician has requested that you have a cardiac MRI.  Cardiac MRI uses a computer to create images of your heart as it's beating, producing both still and moving pictures of your heart and major blood vessels. For further information please visit  [email or web address]  Please follow the instruction sheet given to you today for more information. 4)  Call Katina Dung in about 10days and let her know about any breathing problems/gasping at night. [phone] 5)  Your physician recommends that you schedule a follow-up appointment in: 2 months with Dr Shirlee Latch. Prescriptions: TOPROL XL 100 MG XR24H-TAB (METOPROLOL SUCCINATE) one-half tablet twice a day  #30 x  6   Entered by:   Katina Dung, RN, BSN   Authorized by:   Marca Ancona, MD   Signed by:   Katina Dung, RN, BSN on 10/11/2009   Method used:   [facility]        [facility]  [address] (retail[facility]       [address]       [place], [place]  [address]       Ph: [phone] or [phone]       Fax: [phone]   RxID:   [id number]

## 2010-08-02 NOTE — Letter (Signed)
Summary: Appointment - Cardiac MRI  [facility], Main Office  [address]   [place], [place] [address]   Phone: [phone]  Fax: [phone]      Nov 05, 2009 MRN: [id number]   Jill Baker [address] [place], [place]  [address]   Dear [name],   We have scheduled the above patient for an appointment for a Cardiac MRI on 11/30/2009 at 9:00am.  Please refer to the below information for the location and instructions for this test:  Location:     [facility]       [address]       [place], [place]  [address] Instructions:    Arrive at [facility] 45 minutes prior to your appointment time.  This will ensure you are in the Radiology Department 30 minutes prior to your appointment.    There are no restrictions for this test you may eat and take medications as usual.  If you need to reschedule this appointment please call at the number listed above.  Sincerely,  Migdalia Dk [facility] Scheduling Team

## 2010-08-02 NOTE — Progress Notes (Signed)
Summary: refill Temazepam  Phone Note Refill Request Message from:  Fax from Pharmacy on December 08, 2009 9:05 AM  Refills Requested: Medication #1:  TEMAZEPAM 30 MG CAPS 1 at bedtime prn   Dosage confirmed as above?Dosage Confirmed   Supply Requested: 1 month   Last Refilled: 09/12/2009  Method Requested: Fax to Local Pharmacy Initial call taken by: Raechel Ache, RN,  December 08, 2009 9:05 AM [facility]  [address] [id number]  Follow-up for Phone Call        can ok x 1  further refills per Dr Clent Ridges Follow-up by: Madelin Headings MD,  December 08, 2009 5:35 PM    Prescriptions: TEMAZEPAM 30 MG CAPS (TEMAZEPAM) 1 at bedtime prn  #30 x 0   Entered by:   Raechel Ache, RN   Authorized by:   Madelin Headings MD   Signed by:   Raechel Ache, RN on 12/09/2009   Method used:   Historical   RxID:   [id number]

## 2010-08-02 NOTE — Assessment & Plan Note (Signed)
Summary: ec6/ pt has umr/ gd  Medications Added TOPROL XL 100 MG XR24H-TAB (METOPROLOL SUCCINATE) one  daily in the morning and one-half in the evening POTASSIUM CHLORIDE CRYS CR 20 MEQ CR-TABS (POTASSIUM CHLORIDE CRYS CR) Take one tablet by mouth daily      Allergies Added:   Referring Provider:  n/a Primary Provider:  Gershon Crane, MD   CC:  sob/palpitations and fatigue.  Lightheadedness and chest pain.  reports this for the last few years on and off but progressively worse in the last 6 months.  History of Present Illness: 30 yo with history of sinus tachycardia presents for evaluation of palpitations, shortness of breath, and leg edema.  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  She says that she had an EP study in [place] in 2003 that apparently was normal.  She was talking diltiazem CD in the past but developed significant leg swelling so this was stopped and she was begun on Toprol XL instead.  Swelling improved off diltiazem but was still significant so she has been taking Lasix.  If she stops Lasix, the swelling returns.  Lately, she has been having more fluttering and extra beats.  This occurs daily.  When the palpitations occur, she becomes short of breath.  She has had a lot of trouble with dyspnea.  Sometimes she is short of breath at rest.  It does not take much exertion to wear her out.  No orthopnea or PND, though sometimes she will wake up with fluttering in her chest.  No lightheadedness or syncope.    Labs (1/11): ANA negative, K 3.4, creatinine 1.0, BNP 25, TSH normal, ESR 15 (normal)  ECG: NSR, borderline left atrial enlargement.   Current Medications (verified): 1)  Levothyroxine Sodium 100 Mcg Tabs (Levothyroxine Sodium) .... Once Daily 2)  Bcp .Marland Kitchen.. 1 By Mouth Once Daily 3)  Fexofenadine Hcl 180 Mg Tabs (Fexofenadine Hcl) .Marland Kitchen.. 1 Once Daily As Needed Allergies 4)  Omeprazole 40 Mg Cpdr (Omeprazole) .... Once Daily 5)  Temazepam 30  Mg Caps (Temazepam) .Marland Kitchen.. 1 At Bedtime Prn 6)  Toprol Xl 100 Mg Xr24h-Tab (Metoprolol Succinate) .... Once Daily 7)  Furosemide 40 Mg Tabs (Furosemide) .... Once Daily 8)  Cyanocobalamin 1000 Mcg/ml Soln (Cyanocobalamin) .Marland Kitchen.. 1 Ml Intramuscular Weekly For 3 Mths Then Ov 9)  Librax 2.5-5 Mg Caps (Clidinium-Chlordiazepoxide) .Marland Kitchen.. 1 By Mouth Ac and At Bedtime As Needed  Allergies (verified): 1)  ! Nexium (Esomeprazole Magnesium)  Past History:  Past Medical History: 1. 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  She says that she had an EP study in [place] that was normal.  2. DIVERTICULOSIS-COLON (ICD-562.10) 3. IRRITABLE BOWEL SYNDROME (ICD-564.1) 4. VITAMIN B12 DEFICIENCY (ICD-266.2) 5. EDEMA (ICD-782.3) 6. GERD (ICD-530.81) 7. HYPOTHYROIDISM (ICD-244.9)    Family History: Reviewed history from 05/21/2009 and no changes required. 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   Review of Systems       All systems reviewed and negative except as per  HPI.   Vital Signs:  Patient profile:   30 year old female Height:      71 inches Weight:      264 pounds BMI:     36.95 Pulse rate:   82 / minute Pulse rhythm:   regular BP sitting:   116 / 84  (left arm) Cuff size:   large  Vitals Entered By: Judithe Modest CMA (September 03, 2009 3:04 PM)  Physical Exam  General:  Well developed, well nourished, in no acute distress. Head:  normocephalic  and atraumatic Nose:  no deformity, discharge, inflammation, or lesions Mouth:  Teeth, gums and palate normal. Oral mucosa normal. Neck:  Neck supple, no JVD. No masses, thyromegaly or abnormal cervical nodes. Lungs:  Clear bilaterally to auscultation and percussion. Heart:  Non-displaced PMI, chest non-tender; regular rate and rhythm, S1, S2 without murmurs, rubs or gallops. Carotid upstroke normal, no bruit. Pedals normal pulses. No edema, no varicosities. Abdomen:  Bowel sounds positive; abdomen soft and non-tender without masses, organomegaly, or hernias noted. No hepatosplenomegaly. Msk:  Back normal, normal gait. Muscle strength and tone normal. Extremities:  No clubbing or cyanosis. Neurologic:  Alert and oriented x 3. Skin:  Intact without lesions or rashes. Psych:  Normal affect.   Impression & Recommendations:  Problem # 1:  PALPITATIONS (ICD-785.1) Patient seems to have had an increase in palpitations since stopping diltiazem and starting Toprol XL.  I would like to avoid diltiazem if possible, given her significant edema on this medication.  Patient has a history of what seems to be inappropriate sinus tachycardia, with history of normal EP study in [place] in 2003.  I will increase her Toprol XL to 100 mg qam, 50 mg qpm.  I will have her do a 48 hour holter monitor (? sinus node reentry tachycardia that might be mis-identified as sinus tachycardia).  Hypokalemic when labs last checked, will repeat BMET and start low dose of KCl as hypokalemia may drive premature beats.    Problem # 2:  SHORTNESS OF BREATH (ICD-786.05) Appears euvolemic on exam and BNP was normal when recently checked.  Her weight and deconditioning may be playing a role in the dyspnea.  Will get an echo to assess for any structural cardiac problems.    Other Orders: EKG w/ Interpretation (93000) T-Basic Metabolic Panel [id number]) Echocardiogram (Echo) Holter Monitor (Holter Monitor)  Patient  Instructions: 1)  Your physician has recommended you make the following change in your medication:  2)  Start KCL (potassium) daily 3)  AFTER  you have completed the 48 hour monitor increase Toprol XL to 100mg  in the morning and 50mg  in the evening 4)  Lab today---BMP 785.1 5)  Your physician has requested that you have an echocardiogram.  Echocardiography is a painless test that uses sound waves to create images of your heart. It provides your doctor with information about the size and shape of your heart and how well your heart's chambers and valves are working.  This procedure takes approximately one hour. There are no restrictions for this procedure. 6)  Your physician has recommended that you wear a holter monitor.  Holter monitors are medical devices that record the heart's electrical activity. Doctors most often use these monitors to diagnose arrhythmias. Arrhythmias are problems with the speed or rhythm of the heartbeat. The monitor is a small, portable device. You can wear one while you do your normal daily activities. This is usually used to diagnose what is causing palpitations/syncope (passing  out). 48 hour monitor 7)  Your physician recommends that you schedule a follow-up appointment with Dr Shirlee Latch in 2-3 weeks after testing is completed. Prescriptions: TOPROL XL 100 MG XR24H-TAB (METOPROLOL SUCCINATE) one  daily in the morning and one-half in the evening  #50 x 6   Entered by:   Katina Dung, RN, BSN   Authorized by:   Marca Ancona, MD   Signed by:   Katina Dung, RN, BSN on 09/03/2009   Method used:   [facility]        [facility]  [address] [id number]       [address]       [place], [place]  [address]       Ph: [phone] or [phone]       Fax: [phone]   RxID:   [id number] POTASSIUM CHLORIDE CRYS CR 20 MEQ CR-TABS (POTASSIUM CHLORIDE CRYS CR) Take one tablet by mouth daily  #30 x 6   Entered by:   Katina Dung, RN, BSN   Authorized by:    Marca Ancona, MD   Signed by:   Katina Dung, RN, BSN on 09/03/2009   Method used:   [facility]        [facility]  [address] [facility]       [address]       [place], [place]  [address]       Ph: [phone] or [phone]       Fax: [phone]   RxID:   [id number]

## 2010-08-02 NOTE — Progress Notes (Signed)
Summary: returning call   Phone Note Call from Patient Call back at Home Phone [phone]   Caller: Patient Reason for Call: Talk to Nurse Summary of Call: returning call Initial call taken by: Migdalia Dk,  December 03, 2009 4:09 PM  Follow-up for Phone Call        Called patient with results of MRI. Follow-up by: Suzan Garibaldi RN

## 2010-09-21 LAB — POCT I-STAT, CHEM 8
Chloride: 107 mEq/L (ref 96–112)
Creatinine, Ser: 0.9 mg/dL (ref 0.4–1.2)
Glucose, Bld: 90 mg/dL (ref 70–99)
Hemoglobin: 16.7 g/dL — ABNORMAL HIGH (ref 12.0–15.0)
Potassium: 4 mEq/L (ref 3.5–5.1)
Sodium: 138 mEq/L (ref 135–145)

## 2010-09-21 LAB — BRAIN NATRIURETIC PEPTIDE: Pro B Natriuretic peptide (BNP): <30 pg/mL (ref 0.0–100.0)

## 2010-09-27 ENCOUNTER — Ambulatory Visit (INDEPENDENT_AMBULATORY_CARE_PROVIDER_SITE_OTHER): Payer: 59 | Admitting: Family Medicine

## 2010-09-27 ENCOUNTER — Encounter: Payer: Self-pay | Admitting: Family Medicine

## 2010-09-27 VITALS — BP 120/80 | HR 80 | Temp 98.5°F

## 2010-09-27 DIAGNOSIS — R221 Localized swelling, mass and lump, neck: Secondary | ICD-10-CM

## 2010-09-27 DIAGNOSIS — E039 Hypothyroidism, unspecified: Secondary | ICD-10-CM

## 2010-09-27 LAB — CBC WITH DIFFERENTIAL/PLATELET
Basophils Relative: 0.4 % (ref 0.0–3.0)
Eosinophils Absolute: 0.2 10*3/uL (ref 0.0–0.7)
Eosinophils Relative: 2.5 % (ref 0.0–5.0)
HCT: 45.4 % (ref 36.0–46.0)
Lymphs Abs: 1.8 10*3/uL (ref 0.7–4.0)
MCHC: 34.3 g/dL (ref 30.0–36.0)
MCV: 89.3 fl (ref 78.0–100.0)
Monocytes Absolute: 0.7 10*3/uL (ref 0.1–1.0)
Neutro Abs: 6.9 10*3/uL (ref 1.4–7.7)
Neutrophils Relative %: 71.4 % (ref 43.0–77.0)
RBC: 5.09 Mil/uL (ref 3.87–5.11)

## 2010-09-27 LAB — T3, FREE: T3, Free: 2.6 pg/mL (ref 2.3–4.2)

## 2010-09-27 MED ORDER — AMOXICILLIN 875 MG PO TABS: 875.0000 mg | ORAL_TABLET | Freq: Two times a day (BID) | ORAL | Status: AC

## 2010-09-27 NOTE — Progress Notes (Signed)
  Subjective:    Patient ID: Jill Baker, female    DOB: 1981-05-29, 30 y.o.   MRN: [id number]  HPI Here for 3 weeks of swelling in the neck and now several days of some mild tenderness in this area. No trouble speaking or swallowing. No fever or ST or any URI symptoms. She had a full dental exam a few days ago, and this came out fine.    Review of Systems  Constitutional: Negative.   HENT: Positive for neck pain. Negative for ear pain, congestion, sore throat, facial swelling, trouble swallowing, neck stiffness, dental problem, voice change and postnasal drip.   Eyes: Negative.   Respiratory: Negative.   Hematological: Positive for adenopathy.       Objective:   Physical Exam  Constitutional: She appears well-developed and well-nourished.  HENT:  Head: Normocephalic and atraumatic.  Right Ear: External ear normal.  Left Ear: External ear normal.  Nose: Nose normal.  Mouth/Throat: Oropharynx is clear and moist. No oropharyngeal exudate.  Eyes: Conjunctivae are normal. Pupils are equal, round, and reactive to light.  Neck: No tracheal deviation present. Thyromegaly present.       The entire anterior neck is slightly swollen and slightly tender. The thyroid is mildly enlarged but smooth and not nodular. No specific lymph nodes are felt.   Pulmonary/Chest: Effort normal and breath sounds normal.  Lymphadenopathy:    She has no cervical adenopathy.          Assessment & Plan:  It is unclear if this is from lymphadenopathy or from a thyroid source. Start on empiric antibiotics. Check a thyroid panel and a CBC.

## 2010-09-29 ENCOUNTER — Telehealth: Payer: Self-pay

## 2010-09-29 NOTE — Telephone Encounter (Signed)
Pt aware of lab results 

## 2010-09-29 NOTE — Telephone Encounter (Signed)
Message copied by Madison Hickman on Thu Sep 29, 2010  1:17 PM ------      Message from: Dwaine Deter      Created: Thu Sep 29, 2010  8:44 AM       Normal. Her thyroid levels are perfect now

## 2010-10-03 LAB — URINE CULTURE
Colony Count: NO GROWTH
Culture: NO GROWTH

## 2010-10-03 LAB — POCT URINALYSIS DIP (DEVICE)
Bilirubin Urine: NEGATIVE
Hgb urine dipstick: NEGATIVE
Ketones, ur: NEGATIVE mg/dL
Protein, ur: NEGATIVE mg/dL
Specific Gravity, Urine: 1.025 (ref 1.005–1.030)
pH: 5 (ref 5.0–8.0)

## 2010-11-18 NOTE — Op Note (Signed)
Jill Baker, Jill Baker              ACCOUNT NO.:  [id number]   MEDICAL RECORD NO.:  [id number]          PATIENT TYPE:  AMB   LOCATION:  SDS                          FACILITY:  [facility]   PHYSICIAN:  [name]. Jill Baker, M.D.DATE OF BIRTH:  1980/12/20   DATE OF PROCEDURE:  09/05/2005  DATE OF DISCHARGE:  09/05/2005                                 OPERATIVE REPORT   PREOPERATIVE DIAGNOSIS:  Biliary dyskinesia.   POSTOPERATIVE DIAGNOSIS:  Biliary dyskinesia.   PROCEDURE:  Laparoscopic cholecystectomy.   SURGEON:  [name]. Jill Baker, M.D.   ASSISTANT:  Wilmon Arms. Tsuei, M.D.   ANESTHESIA:  General.   HISTORY OF PRESENT ILLNESS:  The patient is a 30 year old white female with  a history of episodic epigastric and right upper quadrant pain, and she also  had one episode of transient elevation of her liver function tests.  Workup  did not reveal gallstones but otherwise seemed consistent with biliary  dyskinesia, and she presents for a laparoscopic cholecystectomy today.   PROCEDURE IN DETAIL:  Informed consent was obtained.  The patient received  intravenous antibiotics.  She was identified in the preop holding area  She  was brought to the operating room and general anesthesia was administered.  Her abdomen was prepped and draped in a sterile fashion.  Marcaine 0.25% was  injected in the supraumbilical area.  A supraumbilical midline incision was  made.  Subcutaneous tissues were dissected down, revealing the anterior  fascia.  This was divided sharply.  The peritoneal cavity was then entered  under direct vision without difficulty.  A 0 Vicryl pursestring suture was  placed around the fascial opening and the Hasson trocar was inserted into  the abdomen, and the abdomen was insufflated with carbon dioxide in standard  fashion.  Under direct vision an 11 mm epigastric and two 5-mm lateral ports  were placed.  Marcaine 0.25% was used at all port sites.  The dome of  gallbladder was  retracted superomedially and the infundibulum was retracted  inferolaterally.  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  We  stayed away from that.  Dissection began laterally and progressed medially,  identifying the cystic duct, which was quite small in diameter.  Dissection  continued until a large window was created between the cystic duct, the  infundibulum of the gallbladder and the liver.  Once this was accomplished,  a clip was placed on the infundibulo-cystic duct junction.  A hole was cut  in the cystic duct and then we attempted to place a Reddick cholangiogram  catheter.  The diameter of the cystic duct was significantly smaller than  the diameter of the catheter, and were not able to insert it.  The  cholangiogram was abandoned after multiple attempts.  Two clips were placed  proximally on the cystic duct and it was divided.  Further dissection  revealed an anterior branch of the cystic artery.  This was clipped twice  proximally, once distally, and divided.  We then began removing the  gallbladder from the  liver bed with the Bovie cautery.  We encountered a  larger posterior branch of the cystic artery.  This was clipped three times  proximally and once distally and divided.  The gallbladder was removed from  the liver bed with the Bovie cautery.  We used cautery to get excellent  hemostasis along the way.  Next, the gallbladder was placed in an EndoCatch  bag and taken out of the abdomen via the infraumbilical port site.  The  liver bed was then copiously irrigated and reinspected.  There was excellent  hemostasis.  The remainder of the irrigation fluid was evacuated and  remained clear.  The liver bed was reinspected, clips remained in good  position, and there was no bleeding.  The remaining a irrigation fluid was  suctioned out and the ports were then removed under direct vision.  Prior  to  release of the pneumoperitoneum and removal of the 11 mm epigastric port,  the Hasson trocar was removed and the fascia was closed by tying the 0  Vicryl pursestring suture while observing under direct vision from inside.  Once that was accomplished, the 11 mm trocar was removed.  The remainder of  the pneumoperitoneum was released.  All four wounds were copiously  irrigated.  Some additional local anesthetic was injected and the skin of  each was closed with running 4-0 Vicryl subcuticular stitch.  Sponge, needle  and instrument counts were correct.  Benzoin, Steri-Strips and sterile  dressings were applied.  The patient tolerated the procedure well without  apparent complication and was taken to the recovery room in stable  condition.      [name] Jill Baker, M.D.  Electronically Signed     BET/MEDQ  D:  09/05/2005  T:  09/06/2005  Job:  [id number]   cc:   Wilhemina Bonito. Marina Goodell, M.D. [facility]  [address]  [place]  [place] [address]   Tera Mater. Clent Ridges, M.D. [facility]  [address] [place]  [place] [address]   Rollene Rotunda, M.D.  [address]  [address]  [place]  [place] [address]

## 2010-11-21 ENCOUNTER — Other Ambulatory Visit (HOSPITAL_COMMUNITY): Payer: Self-pay | Admitting: Otolaryngology

## 2010-11-21 DIAGNOSIS — R591 Generalized enlarged lymph nodes: Secondary | ICD-10-CM

## 2010-11-22 ENCOUNTER — Other Ambulatory Visit: Payer: Self-pay | Admitting: *Deleted

## 2010-11-22 MED ORDER — LEVOTHYROXINE SODIUM 150 MCG PO TABS: 150.0000 ug | ORAL_TABLET | Freq: Every day | ORAL | Status: DC

## 2010-11-25 ENCOUNTER — Ambulatory Visit (HOSPITAL_COMMUNITY)
Admission: RE | Admit: 2010-11-25 | Discharge: 2010-11-25 | Disposition: A | Discharge status: Home / Self Care | Payer: 59 | Source: Ambulatory Visit | Attending: Otolaryngology | Admitting: Otolaryngology

## 2010-11-25 DIAGNOSIS — M542 Cervicalgia: Secondary | ICD-10-CM | POA: Insufficient documentation

## 2010-11-25 DIAGNOSIS — R599 Enlarged lymph nodes, unspecified: Secondary | ICD-10-CM | POA: Insufficient documentation

## 2010-11-25 DIAGNOSIS — R591 Generalized enlarged lymph nodes: Secondary | ICD-10-CM

## 2010-11-25 DIAGNOSIS — R220 Localized swelling, mass and lump, head: Secondary | ICD-10-CM | POA: Insufficient documentation

## 2010-11-25 MED ORDER — IOHEXOL 300 MG/ML  SOLN
75.0000 mL | Freq: Once | INTRAMUSCULAR | Status: AC | PRN
  Administered 2010-11-25: 75 mL via INTRAVENOUS

## 2010-12-30 ENCOUNTER — Other Ambulatory Visit (HOSPITAL_COMMUNITY): Payer: Self-pay | Admitting: Otolaryngology

## 2010-12-30 DIAGNOSIS — R599 Enlarged lymph nodes, unspecified: Secondary | ICD-10-CM

## 2011-01-10 ENCOUNTER — Ambulatory Visit (HOSPITAL_COMMUNITY): Payer: 59

## 2011-01-11 ENCOUNTER — Ambulatory Visit (HOSPITAL_COMMUNITY): Payer: 59

## 2011-01-11 ENCOUNTER — Other Ambulatory Visit (HOSPITAL_COMMUNITY): Payer: 59

## 2011-01-20 ENCOUNTER — Other Ambulatory Visit (HOSPITAL_COMMUNITY): Payer: Self-pay | Admitting: Otolaryngology

## 2011-01-20 ENCOUNTER — Ambulatory Visit (HOSPITAL_COMMUNITY)
Admission: RE | Admit: 2011-01-20 | Discharge: 2011-01-20 | Disposition: A | Discharge status: Home / Self Care | Payer: 59 | Source: Ambulatory Visit | Attending: Otolaryngology | Admitting: Otolaryngology

## 2011-01-20 ENCOUNTER — Ambulatory Visit (HOSPITAL_COMMUNITY): Payer: 59

## 2011-01-20 ENCOUNTER — Other Ambulatory Visit: Payer: Self-pay | Admitting: Interventional Radiology

## 2011-01-20 DIAGNOSIS — M542 Cervicalgia: Secondary | ICD-10-CM | POA: Insufficient documentation

## 2011-01-20 DIAGNOSIS — R599 Enlarged lymph nodes, unspecified: Secondary | ICD-10-CM | POA: Insufficient documentation

## 2011-02-01 ENCOUNTER — Telehealth (INDEPENDENT_AMBULATORY_CARE_PROVIDER_SITE_OTHER): Payer: Self-pay | Admitting: General Surgery

## 2011-02-01 NOTE — Telephone Encounter (Signed)
I called pt and notified her that BT is out of the office until late August.  I told her I would leave him a voicemail and see if he can work her in sooner than later and I can call her back.

## 2011-02-07 ENCOUNTER — Ambulatory Visit (INDEPENDENT_AMBULATORY_CARE_PROVIDER_SITE_OTHER): Payer: 59 | Admitting: General Surgery

## 2011-02-07 ENCOUNTER — Encounter (INDEPENDENT_AMBULATORY_CARE_PROVIDER_SITE_OTHER): Payer: Self-pay | Admitting: General Surgery

## 2011-02-07 VITALS — BP 110/70 | HR 76 | Ht 71.0 in | Wt 230.0 lb

## 2011-02-07 DIAGNOSIS — R591 Generalized enlarged lymph nodes: Secondary | ICD-10-CM

## 2011-02-07 DIAGNOSIS — R599 Enlarged lymph nodes, unspecified: Secondary | ICD-10-CM

## 2011-02-07 NOTE — Progress Notes (Signed)
Subjective:     Patient ID: Jill Baker, female   DOB: Apr 08, 1981, 30 y.o.   MRN: [id number]  HPI Patient has has right cervical lymphadenopathy since early this year.  She was treated with PCN for two weeks by her primary MD without change.  CT neck shows R cervical LN > 1cm.  Also retroclavicular and retrosternal lymphadenopathy.  FNA nonspecific reactive cells without B cell or T cell predominant.  Patient saw Dr. Jearld Fenton at [facility].  He Rx Valtrex for 2 weeks for presumed Herpes.  Then cephalosporin for two weeks without change.  He offered Bx but patient wanted to see me for opinion.  Review of Systems  Constitutional: Positive for fever.  HENT: Positive for neck pain. Negative for ear pain and facial swelling.   Eyes: Negative for pain.  Respiratory: Negative for cough and shortness of breath.   Cardiovascular: Negative for chest pain.  Gastrointestinal: Negative for abdominal distention.  Genitourinary: Negative for dysuria, genital sores and pelvic pain.  Musculoskeletal: Negative for arthralgias.  Neurological: Positive for headaches. Negative for dizziness.  Hematological: Positive for adenopathy. Does not bruise/bleed easily.       Objective:   Physical Exam  Constitutional: She is oriented to person, place, and time. She appears well-developed and well-nourished. No distress.  HENT:  Head: Normocephalic.  Mouth/Throat: Oropharynx is clear and moist.  Eyes: Conjunctivae are normal. Pupils are equal, round, and reactive to light. Right eye exhibits no discharge. No scleral icterus.  Neck: No JVD present. No tracheal deviation present. No thyromegaly present.  Cardiovascular: Normal rate, regular rhythm and normal heart sounds.   No murmur heard. Pulmonary/Chest: Effort normal and breath sounds normal. No respiratory distress. She has no wheezes.  Abdominal: Soft. Bowel sounds are normal. She exhibits no distension. There is no tenderness. There is no rebound.    Musculoskeletal: Normal range of motion. She exhibits no edema.  Lymphadenopathy:    She has cervical adenopathy.  Neurological: She is alert and oriented to person, place, and time.  Skin: Skin is warm and dry.  Right neck palpable LN 1.5CM mid neck with tenderness, rubbery     Assessment:     Right cervical adenopathy    Plan:     I have offered right cervical lymph node biopsy.  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  Patient agrees.  Patients mother was present.  Questions answered

## 2011-02-20 ENCOUNTER — Encounter (HOSPITAL_BASED_OUTPATIENT_CLINIC_OR_DEPARTMENT_OTHER)
Admission: RE | Admit: 2011-02-20 | Discharge: 2011-02-20 | Disposition: A | Discharge status: Home / Self Care | Payer: 59 | Source: Ambulatory Visit | Attending: General Surgery | Admitting: General Surgery

## 2011-02-20 ENCOUNTER — Other Ambulatory Visit: Payer: Self-pay | Admitting: Family Medicine

## 2011-02-20 ENCOUNTER — Telehealth: Payer: Self-pay | Admitting: Family Medicine

## 2011-02-20 LAB — BASIC METABOLIC PANEL
BUN: 9 mg/dL (ref 6–23)
Calcium: 8.9 mg/dL (ref 8.4–10.5)
Chloride: 109 mEq/L (ref 96–112)
Creatinine, Ser: 0.67 mg/dL (ref 0.50–1.10)
GFR calc Af Amer: >60 mL/min (ref >60)

## 2011-02-20 NOTE — Telephone Encounter (Signed)
Refill request for Temazepam 30 mg capsule, take 1 po qhs prn. Pt last here on 09/27/10 and script last filled on 05/23/10.

## 2011-02-21 MED ORDER — TEMAZEPAM 30 MG PO CAPS: 30.0000 mg | ORAL_CAPSULE | Freq: Every evening | ORAL | Status: AC | PRN

## 2011-02-21 NOTE — Telephone Encounter (Signed)
done

## 2011-02-21 NOTE — Telephone Encounter (Signed)
Call in #[phone] with 5 rf 

## 2011-02-24 ENCOUNTER — Ambulatory Visit (HOSPITAL_BASED_OUTPATIENT_CLINIC_OR_DEPARTMENT_OTHER)
Admission: RE | Admit: 2011-02-24 | Discharge: 2011-02-24 | Disposition: A | Discharge status: Home / Self Care | Payer: 59 | Source: Ambulatory Visit | Attending: General Surgery | Admitting: General Surgery

## 2011-02-24 ENCOUNTER — Other Ambulatory Visit (INDEPENDENT_AMBULATORY_CARE_PROVIDER_SITE_OTHER): Payer: Self-pay | Admitting: General Surgery

## 2011-02-24 DIAGNOSIS — Z01812 Encounter for preprocedural laboratory examination: Secondary | ICD-10-CM | POA: Insufficient documentation

## 2011-02-24 DIAGNOSIS — R599 Enlarged lymph nodes, unspecified: Secondary | ICD-10-CM | POA: Insufficient documentation

## 2011-02-24 DIAGNOSIS — Z01810 Encounter for preprocedural cardiovascular examination: Secondary | ICD-10-CM | POA: Insufficient documentation

## 2011-02-24 HISTORY — PX: OTHER SURGICAL HISTORY: SHX169

## 2011-02-28 ENCOUNTER — Telehealth (INDEPENDENT_AMBULATORY_CARE_PROVIDER_SITE_OTHER): Payer: Self-pay | Admitting: General Surgery

## 2011-02-28 NOTE — Telephone Encounter (Signed)
Spoke with patient in person at [facility].  Patient notified of pathology results.  Wound looks good.  Patient having some localized numbness but otherwise doing well.

## 2011-02-28 NOTE — Op Note (Signed)
Jill Baker              ACCOUNT NO.:  [id number]  MEDICAL RECORD NO.:  [id number]  LOCATION:                                 FACILITY:  PHYSICIAN:  [name]. [name], M.D.DATE OF BIRTH:  Nov 27, 1980  DATE OF PROCEDURE:  02/24/2011 DATE OF DISCHARGE:                              OPERATIVE REPORT   PREOPERATIVE DIAGNOSIS:  Right cervical lymphadenopathy.  POSTOPERATIVE DIAGNOSIS:  Right cervical lymphadenopathy.  PROCEDURE:  Right cervical lymph node biopsy.  SURGEON:  [name]. [name], MD  ANESTHESIA:  General with laryngeal mask airway.  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  She was treated both with antivirals and antibiotics by ENT specialist.  She also had a fine-needle aspiration biopsy of one of these nodes in her right neck.  This demonstrated nonspecific reactive cells without B-cell or T-cell predominance.  There were no features of lymphoma on flow cytometry.  She continues to be bothered by a palpable lymph node in her right lateral neck and she asked me to evaluate for biopsy.  Note, that was biopsied with ultrasound guidance by Interventional Radiology, it is actually deeper and right on top of her jugular vein and not very palpable but she does seem to have another palpable rubbery node in her lateral mid neck.  She presents for biopsy of this today.  PROCEDURE IN DETAIL:  Informed consent was obtained, the site was marked.  She received intravenous antibiotics.  She was brought to the operating room.  General anesthesia with laryngeal mask airway was administered by anesthesia staff.  Her right neck was prepped and draped in sterile fashion.  We did a time-out procedure.  An incision was made transversely along tissue plane lines over this rubbery palpable mass. Subcutaneous  tissues were dissected down, and there were actually two smaller rubbery masses near each other in the subcutaneous tissues.  The first was dissected free off of underlying subcutaneous tissues and off of the underlying musculature leaving musculature intact.  This was sent as specimen 1.  Hemostasis was obtained with Bovie cautery.  There was another harder nodular area though not a frankly abnormal-appearing lymph node that did correspond with the palpable area as well.  This was circumferentially dissected from subcutaneous fat and again staying above musculature and other structures, this was removed as well. Excellent hemostasis was obtained with Bovie cautery.  There were no other palpable abnormalities in this area or the area surrounding it. Both of the specimens were sent fresh to pathology and 0.5% Marcaine with epinephrine was injected in the dissection bed as well as subcutaneously for postoperative pain relief.  The wound was irrigated. Hemostasis was ensured.  Subcutaneous tissues were approximated with interrupted 3-0 Vicryl suture, and the skin was closed with running 4- 0 Monocryl subcuticular stitch followed by Dermabond.  Sponge, needle, and instrument counts were correct.  The patient tolerated the procedure well without apparent complication and was taken to recovery room in stable condition.     [name] [name], M.D.  BET/MEDQ  D:  02/24/2011  T:  02/24/2011  Job:  [id number]  cc:   Jeannett Senior A. Clent Ridges, MD  Electronically Signed by Violeta Gelinas M.D. on 02/28/2011 05:17:29 PM

## 2011-03-07 ENCOUNTER — Encounter (INDEPENDENT_AMBULATORY_CARE_PROVIDER_SITE_OTHER): Payer: Self-pay

## 2011-03-08 ENCOUNTER — Encounter (INDEPENDENT_AMBULATORY_CARE_PROVIDER_SITE_OTHER): Payer: Self-pay | Admitting: General Surgery

## 2011-03-08 ENCOUNTER — Ambulatory Visit (INDEPENDENT_AMBULATORY_CARE_PROVIDER_SITE_OTHER): Payer: 59 | Admitting: General Surgery

## 2011-03-08 VITALS — BP 104/78 | HR 68

## 2011-03-08 DIAGNOSIS — R590 Localized enlarged lymph nodes: Secondary | ICD-10-CM

## 2011-03-08 DIAGNOSIS — R599 Enlarged lymph nodes, unspecified: Secondary | ICD-10-CM

## 2011-03-08 NOTE — Progress Notes (Signed)
Subjective:     Patient ID: Jill Baker, female   DOB: 12-14-1980, 30 y.o.   MRN: [id number]  HPI Patient is status post lymph node biopsy right neck. 2 masses were removed. 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 There were some peripheral nerve cells as well. The lymph node tissue was obtained. As per previous notes this has all been discussed in detail with the patient in person. She is doing well postoperatively otherwise. She still having some mild localized numbness. She has no other complaints. Her mother again accompanies her.  Review of Systems     Objective:   Physical Exam    Right neck incision is clean dry and intact. There is minimal swelling. There are no signs of infection. There is mild localized numbness of the right lateral neck extending up portion of the earlobe. There is no hyperesthesia. Muscular function is normal. Left exam otherwise reveals no significant discernible left cervical lymphadenopathy. There is minimal shotty lymph nodes palpable in the left axilla. No significant lymphadenopathy is palpable in the right axilla. There is no periumbilical or bilateral inguinal enlarged lymph nodes however some shotty nodes are palpable in the left eye Assessment:    Status post right cervical soft tissue biopsy      Plan:        It is frustrating to further lymph node tissue was not obtained. The patient continues to have long-standing complaints of malaise. I feel her mild localized numbness will improve over time. This is due to  small associated skin nerves taken  in surgery. There is no other significant palpable lymphadenopathy. She does have one deep node along her right jugular vein which would require a large incision to obtain and increased associated risks. We discussed this in some detail which does not pursue that at this time. In addition we discussed referral to hematology and oncology. She is familiar with Dr. Truett Perna. I would see  the patient back in 4-6 weeks at that time we will determine if further evaluation by hematology is warranted. After referral if this time but she wants to wait and see how she does. I feel it is safe to do.

## 2011-03-13 ENCOUNTER — Other Ambulatory Visit (INDEPENDENT_AMBULATORY_CARE_PROVIDER_SITE_OTHER): Payer: Self-pay | Admitting: General Surgery

## 2011-03-13 DIAGNOSIS — R590 Localized enlarged lymph nodes: Secondary | ICD-10-CM

## 2011-03-21 ENCOUNTER — Encounter (HOSPITAL_BASED_OUTPATIENT_CLINIC_OR_DEPARTMENT_OTHER): Payer: 59 | Admitting: Oncology

## 2011-03-21 ENCOUNTER — Other Ambulatory Visit: Payer: Self-pay | Admitting: Oncology

## 2011-03-21 ENCOUNTER — Encounter: Payer: 59 | Admitting: Oncology

## 2011-03-21 DIAGNOSIS — R599 Enlarged lymph nodes, unspecified: Secondary | ICD-10-CM

## 2011-03-21 DIAGNOSIS — C770 Secondary and unspecified malignant neoplasm of lymph nodes of head, face and neck: Secondary | ICD-10-CM

## 2011-03-21 LAB — COMPREHENSIVE METABOLIC PANEL
AST: 19 U/L (ref 0–37)
Alkaline Phosphatase: 55 U/L (ref 39–117)
BUN: 10 mg/dL (ref 6–23)
Creatinine, Ser: 0.79 mg/dL (ref 0.50–1.10)
Potassium: 4.1 mEq/L (ref 3.5–5.3)

## 2011-03-21 LAB — CBC WITH DIFFERENTIAL/PLATELET
Basophils Absolute: 0 10*3/uL (ref 0.0–0.1)
EOS%: 2.8 % (ref 0.0–7.0)
HGB: 15.8 g/dL (ref 11.6–15.9)
MCH: 30.5 pg (ref 25.1–34.0)
MCV: 88.5 fL (ref 79.5–101.0)
MONO%: 9.5 % (ref 0.0–14.0)
NEUT%: 67.8 % (ref 38.4–76.8)
RDW: 12.6 % (ref 11.2–14.5)

## 2011-03-23 ENCOUNTER — Other Ambulatory Visit: Payer: Self-pay | Admitting: Oncology

## 2011-03-23 DIAGNOSIS — C770 Secondary and unspecified malignant neoplasm of lymph nodes of head, face and neck: Secondary | ICD-10-CM

## 2011-03-28 LAB — BASIC METABOLIC PANEL
CO2: 23
Glucose, Bld: 102 — ABNORMAL HIGH
Potassium: 4
Sodium: 139

## 2011-03-28 LAB — POCT CARDIAC MARKERS
CKMB, poc: <1 — ABNORMAL LOW
Troponin i, poc: <0.05

## 2011-04-26 ENCOUNTER — Encounter (INDEPENDENT_AMBULATORY_CARE_PROVIDER_SITE_OTHER): Payer: 59 | Admitting: General Surgery

## 2011-05-08 ENCOUNTER — Telehealth: Payer: Self-pay | Admitting: Oncology

## 2011-05-08 NOTE — Telephone Encounter (Signed)
Pt clled to r/s her md appt from nov to dec due to her work schedule.

## 2011-05-16 ENCOUNTER — Telehealth: Payer: Self-pay | Admitting: Family Medicine

## 2011-05-16 MED ORDER — LEVOTHYROXINE SODIUM 150 MCG PO TABS: 150.0000 ug | ORAL_TABLET | Freq: Every day | ORAL | Status: DC

## 2011-05-16 NOTE — Telephone Encounter (Signed)
Script sent e-scribe 

## 2011-05-31 ENCOUNTER — Encounter: Payer: Self-pay | Admitting: Oncology

## 2011-05-31 ENCOUNTER — Encounter: Payer: Self-pay | Admitting: *Deleted

## 2011-05-31 DIAGNOSIS — R590 Localized enlarged lymph nodes: Secondary | ICD-10-CM | POA: Insufficient documentation

## 2011-06-01 ENCOUNTER — Ambulatory Visit (HOSPITAL_COMMUNITY)
Admission: RE | Admit: 2011-06-01 | Discharge: 2011-06-01 | Disposition: A | Discharge status: Home / Self Care | Payer: 59 | Source: Ambulatory Visit | Attending: Oncology | Admitting: Oncology

## 2011-06-01 DIAGNOSIS — C770 Secondary and unspecified malignant neoplasm of lymph nodes of head, face and neck: Secondary | ICD-10-CM

## 2011-06-01 DIAGNOSIS — Z9089 Acquired absence of other organs: Secondary | ICD-10-CM | POA: Insufficient documentation

## 2011-06-01 DIAGNOSIS — R599 Enlarged lymph nodes, unspecified: Secondary | ICD-10-CM | POA: Insufficient documentation

## 2011-06-01 DIAGNOSIS — K449 Diaphragmatic hernia without obstruction or gangrene: Secondary | ICD-10-CM | POA: Insufficient documentation

## 2011-06-01 DIAGNOSIS — K7689 Other specified diseases of liver: Secondary | ICD-10-CM | POA: Insufficient documentation

## 2011-06-01 MED ORDER — IOHEXOL 300 MG/ML  SOLN
100.0000 mL | Freq: Once | INTRAMUSCULAR | Status: AC | PRN
  Administered 2011-06-01: 100 mL via INTRAVENOUS

## 2011-06-02 ENCOUNTER — Ambulatory Visit: Payer: 59 | Admitting: Oncology

## 2011-06-05 ENCOUNTER — Ambulatory Visit (HOSPITAL_BASED_OUTPATIENT_CLINIC_OR_DEPARTMENT_OTHER): Payer: 59 | Admitting: Oncology

## 2011-06-05 ENCOUNTER — Telehealth: Payer: Self-pay | Admitting: Oncology

## 2011-06-05 VITALS — BP 107/76 | HR 70 | Temp 97.9°F | Ht 71.0 in | Wt 242.1 lb

## 2011-06-05 DIAGNOSIS — R599 Enlarged lymph nodes, unspecified: Secondary | ICD-10-CM

## 2011-06-05 DIAGNOSIS — R590 Localized enlarged lymph nodes: Secondary | ICD-10-CM

## 2011-06-05 NOTE — Progress Notes (Signed)
[facility] OFFICE PROGRESS NOTE  FRY,STEPHEN A, MD  DIAGNOSIS:  History of reactive right cervical node adenopathy  CURRENT THERAPY:  Watchful observation.   INTERVAL HISTORY: Jill Baker 30 y.o. female returns for regular follow up with her mother.  She reports that she no longer can feel any node swelling.  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  She denies anorexia, weight loss, skin rash, bone pain.  Her dentist saw her recently and saw intermittent whitish lesions on bilateral edges of the tongue.  She was recommended to follow up with a oral surgeon for evaluation.   Patient denies fatigue, headache, visual changes, confusion, drenching night sweats, palpable lymph node swelling, mucositis, odynophagia, dysphagia, nausea vomiting, jaundice, chest pain, palpitation, shortness of breath, dyspnea on exertion, productive cough, gum bleeding, epistaxis, hematemesis, hemoptysis, abdominal pain, abdominal swelling, early satiety, melena, hematochezia, hematuria, skin rash, spontaneous bleeding, joint swelling, joint pain, heat or cold intolerance, bowel bladder incontinence, back pain, focal motor weakness, paresthesia, depression, suicidal or homocidal ideation, feeling hopelessness.   MEDICAL HISTORY: Past Medical History  Diagnosis Date  . Thyroid disease   . Hypothyroid   . Tachycardia   . Cervical adenopathy     SURGICAL HISTORY:  Past Surgical History  Procedure Date  . Cholecystectomy   . Foot surgery   . Tonsillectomy   . Cervical lymph node biopsy 02/24/2011    MEDICATIONS: Current Outpatient Prescriptions  Medication Sig Dispense Refill  . fish oil-omega-3 fatty acids 1000 MG capsule Take 1 g by mouth 2 (two) times daily.       Marland Kitchen levothyroxine (SYNTHROID, LEVOTHROID) 150 MCG tablet Take 1 tablet (150 mcg total) by mouth daily.  90 tablet  0  . metoprolol (TOPROL-XL) 100 MG 24 hr tablet TAKE 1 TABLET BY MOUTH DAILY  90 tablet  1  .  omeprazole (PRILOSEC) 40 MG capsule TAKE 1 CAPSULE BY MOUTH DAILY  90 capsule  1  . Prenatal Vit-Fe Fumarate-FA (M-VIT PO) Take by mouth.          ALLERGIES:  is allergic to esomeprazole magnesium.  REVIEW OF SYSTEMS:  The rest of the 14-point review of system was negative.   Filed Vitals:   06/05/11 1443  BP: 107/76  Pulse: 70  Temp: 97.9 F (36.6 C)   Wt Readings from Last 3 Encounters:  06/05/11 242 lb 1.6 oz (109.816 kg)  02/07/11 230 lb (104.327 kg)  10/11/09 260 lb (117.935 kg)   ECOG Performance status: 0  PHYSICAL EXAMINATION:   General:  well-nourished in no acute distress.  Eyes:  no scleral icterus.  ENT:  There were no oropharyngeal lesions.  Neck was without thyromegaly.  Lymphatics:  Negative cervical, supraclavicular or axillary adenopathy.  Respiratory: lungs were clear bilaterally without wheezing or crackles.  Cardiovascular:  Regular rate and rhythm, S1/S2, without murmur, rub or gallop.  There was no pedal edema.  GI:  abdomen was soft, flat, nontender, nondistended, without organomegaly.  Muscoloskeletal:  no spinal tenderness of palpation of vertebral spine.  Skin exam was without echymosis, petichae.  Neuro exam was nonfocal.  Patient was able to get on and off exam table without assistance.  Gait was normal.  Patient was alerted and oriented.  Attention was good.   Language was appropriate.  Mood was normal without depression.  Speech was not pressured.  Thought content was not tangential.     LABORATORY/RADIOLOGY DATA:  Lab Results  Component Value Date  WBC 8.0 03/21/2011   HGB 15.8 03/21/2011   HCT 45.8 03/21/2011   PLT 242 03/21/2011   GLUCOSE 86 03/21/2011   CHOL 270* 01/24/2010   TRIG 198.0* 01/24/2010   HDL 53.50 01/24/2010   LDLDIRECT 188.3 01/24/2010   ALT 25 03/21/2011   AST 19 03/21/2011   NA 141 03/21/2011   K 4.1 03/21/2011   CL 106 03/21/2011   CREATININE 0.79 03/21/2011   BUN 10 03/21/2011   CO2 24 03/21/2011   TSH 0.83 09/27/2010   IMAGINGS:  I  personally reviewed the following CT and showed the patient and her mother the images.  In brief, there was no adenopathy.   Ct Soft Tissue Neck W Contrast  06/01/2011  *RADIOLOGY REPORT*  Clinical Data: Enlarged lymph node removed from the neck.  Evaluate nodes.  CT NECK WITH CONTRAST  Technique:  Multidetector CT imaging of the neck was performed with intravenous contrast.  Contrast: OMNIPAQUE IOHEXOL 300 MG/ML IV SOLN  Comparison: 11/25/2010.  Findings: Limited intracranial imaging is within normal limits. Imaged orbits and globes unremarkable.  Nasopharynx, oropharynx, hypopharynx, and larynx within normal limits.  The thyroid gland, submandibular glands, and parotid glands all enhance symmetrically.  There are small nodes within the right parotid gland which are unchanged.  The right-sided jugular digastric level II node measures 7 mm on image 31 versus 8 mm on the prior.  Left-sided jugulodigastric node is also similar.  6 mm today.  No adenopathy.  All vascular structures enhance normally.  Small retro sternal nodes are unchanged and likely reactive.  Image sinuses unremarkable.  IMPRESSION: Stable small cervical nodes.  No adenopathy or acute process.  Original Report Authenticated By: Consuello Bossier, M.D.   Ct Chest W Contrast  06/01/2011  *RADIOLOGY REPORT*  Clinical Data: Enlarged lymph node removed from neck.  Adenopathy of neck/chest.  CT CHEST WITH CONTRAST  Technique:  Multidetector CT imaging of the chest was performed following the standard protocol during bolus administration of intravenous contrast.  Contrast: OMNIPAQUE IOHEXOL 300 MG/ML IV SOLN  Comparison: Today's neck CT, dictated separately. Chest CT of 10/30/2008.  Findings: Lung windows demonstrate no nodules airspace opacities.  Soft tissue windows demonstrate no axillary or supraclavicular adenopathy.  Borderline cardiomegaly, without pericardial or pleural effusion. No mediastinal or hilar adenopathy.  Tiny hiatal  hernia.  Limited abdominal imaging demonstrates mild degradation secondary patient arm position.  Too small to characterize hepatic dome lesion is unchanged and likely a cyst.  Cholecystectomy. No acute osseous abnormality.  IMPRESSION:  1.  No evidence of adenopathy. 2. No acute process in the chest.  Please see neck CT, dictated separately.  Original Report Authenticated By: Consuello Bossier, M.D.   ASSESSMENT AND PLAN:   1.  Reactive right cervical adenopathy:  Resolved.  Will continue to observe for the next 6 months with clinical exam only.  I advised her to contact us sooner if she has any concerning symptom especially palpable adenopathy.  2.  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  I advised her to keep appointment with oral surgeon or ENT for confirmation.

## 2011-06-05 NOTE — Telephone Encounter (Signed)
lmonvm advising the pt of her f/u appts in june

## 2011-08-11 ENCOUNTER — Encounter: Payer: Self-pay | Admitting: Family Medicine

## 2011-08-11 ENCOUNTER — Ambulatory Visit (INDEPENDENT_AMBULATORY_CARE_PROVIDER_SITE_OTHER): Payer: 59 | Admitting: Family Medicine

## 2011-08-11 VITALS — BP 116/70 | HR 74 | Temp 98.2°F | Wt 242.0 lb

## 2011-08-11 DIAGNOSIS — R590 Localized enlarged lymph nodes: Secondary | ICD-10-CM

## 2011-08-11 DIAGNOSIS — R000 Tachycardia, unspecified: Secondary | ICD-10-CM

## 2011-08-11 DIAGNOSIS — E039 Hypothyroidism, unspecified: Secondary | ICD-10-CM

## 2011-08-11 DIAGNOSIS — R599 Enlarged lymph nodes, unspecified: Secondary | ICD-10-CM

## 2011-08-11 LAB — TSH: TSH: 0.55 u[IU]/mL (ref 0.35–5.50)

## 2011-08-11 MED ORDER — METOPROLOL SUCCINATE ER 100 MG PO TB24: 100.0000 mg | ORAL_TABLET | Freq: Every day | ORAL | Status: DC

## 2011-08-11 MED ORDER — OMEPRAZOLE 40 MG PO CPDR: 40.0000 mg | DELAYED_RELEASE_CAPSULE | Freq: Every day | ORAL | Status: DC

## 2011-08-11 MED ORDER — LEVOTHYROXINE SODIUM 150 MCG PO TABS: 150.0000 ug | ORAL_TABLET | Freq: Every day | ORAL | Status: DC

## 2011-08-11 NOTE — Progress Notes (Signed)
  Subjective:    Patient ID: Jill Baker, female    DOB: 1980/10/30, 31 y.o.   MRN: [id number]  HPI Here for follow up. She feels fine and has no concerns. Her cervical lymphadenopathy has resolved. Her heart rate is controlled.    Review of Systems  HENT: Negative.   Respiratory: Negative.   Cardiovascular: Negative.        Objective:   Physical Exam  Constitutional: She appears well-developed and well-nourished.  Neck: No thyromegaly present.  Cardiovascular: Normal rate, regular rhythm, normal heart sounds and intact distal pulses.   Pulmonary/Chest: Effort normal and breath sounds normal.  Lymphadenopathy:    She has no cervical adenopathy.          Assessment & Plan:  Get a TSH

## 2011-08-16 NOTE — Progress Notes (Signed)
Quick Note:  Left voice message ______ 

## 2011-09-17 ENCOUNTER — Encounter (HOSPITAL_COMMUNITY): Payer: Self-pay | Admitting: *Deleted

## 2011-09-17 ENCOUNTER — Ambulatory Visit (HOSPITAL_COMMUNITY): Payer: 59

## 2011-09-17 ENCOUNTER — Inpatient Hospital Stay (HOSPITAL_COMMUNITY)
Admission: AD | Admit: 2011-09-17 | Discharge: 2011-09-17 | Disposition: A | Discharge status: Home / Self Care | Payer: 59 | Source: Ambulatory Visit | Attending: Obstetrics & Gynecology | Admitting: Obstetrics & Gynecology

## 2011-09-17 DIAGNOSIS — K222 Esophageal obstruction: Secondary | ICD-10-CM

## 2011-09-17 DIAGNOSIS — E039 Hypothyroidism, unspecified: Secondary | ICD-10-CM

## 2011-09-17 DIAGNOSIS — R197 Diarrhea, unspecified: Secondary | ICD-10-CM

## 2011-09-17 DIAGNOSIS — E538 Deficiency of other specified B group vitamins: Secondary | ICD-10-CM

## 2011-09-17 DIAGNOSIS — R609 Edema, unspecified: Secondary | ICD-10-CM

## 2011-09-17 DIAGNOSIS — R1084 Generalized abdominal pain: Secondary | ICD-10-CM

## 2011-09-17 DIAGNOSIS — R1013 Epigastric pain: Secondary | ICD-10-CM

## 2011-09-17 DIAGNOSIS — R590 Localized enlarged lymph nodes: Secondary | ICD-10-CM

## 2011-09-17 DIAGNOSIS — R002 Palpitations: Secondary | ICD-10-CM

## 2011-09-17 DIAGNOSIS — R0609 Other forms of dyspnea: Secondary | ICD-10-CM

## 2011-09-17 DIAGNOSIS — R0989 Other specified symptoms and signs involving the circulatory and respiratory systems: Secondary | ICD-10-CM

## 2011-09-17 DIAGNOSIS — K219 Gastro-esophageal reflux disease without esophagitis: Secondary | ICD-10-CM

## 2011-09-17 DIAGNOSIS — K589 Irritable bowel syndrome without diarrhea: Secondary | ICD-10-CM

## 2011-09-17 DIAGNOSIS — R0602 Shortness of breath: Secondary | ICD-10-CM

## 2011-09-17 DIAGNOSIS — R21 Rash and other nonspecific skin eruption: Secondary | ICD-10-CM

## 2011-09-17 DIAGNOSIS — R000 Tachycardia, unspecified: Secondary | ICD-10-CM

## 2011-09-17 DIAGNOSIS — K5730 Diverticulosis of large intestine without perforation or abscess without bleeding: Secondary | ICD-10-CM

## 2011-09-17 DIAGNOSIS — K625 Hemorrhage of anus and rectum: Secondary | ICD-10-CM

## 2011-09-17 NOTE — MAU Note (Signed)
Pt here for IUI by Dr. Juliene Pina

## 2011-09-17 NOTE — MAU Provider Note (Signed)
  History   CSN: [id number], Arrival date and time: 09/17/11 [facility]  Chief Complaint  Patient presents with  . Procedure   HPI Here for IUI (AID, washed semen). Specimen brought from office, reviewed and checked with patient.  Procedure reviewed, aware and agrees.    Past Medical History  Diagnosis Date  . Thyroid disease   . Hypothyroid   . Tachycardia   . Cervical adenopathy     reactive, per Dr. Jethro Bolus     Past Surgical History  Procedure Date  . Cholecystectomy   . Foot surgery   . Tonsillectomy   . Cervical lymph node biopsy 02/24/2011    reactive only, per Dr. Violeta Gelinas    Family History  Problem Relation Age of Onset  . Heart disease Mother   . Hypertension Mother   . Breast cancer Maternal Grandmother     History  Substance Use Topics  . Smoking status: Never Smoker   . Smokeless tobacco: Never Used  . Alcohol Use: No    Allergies:  Allergies  Allergen Reactions  . Esomeprazole Magnesium     REACTION: rash, itching,tachycardia    No prescriptions prior to admission    ROS negative.  Physical Exam   Blood pressure 104/69, pulse 93, temperature 97.8 F (36.6 C), temperature source Oral, resp. rate 18, height 5\' 10"  (1.778 m), weight 108.591 kg (239 lb 6.4 oz).  Physical Exam:  A&O x 3, no acute distress. Pleasant Pelvic Cervix normal, no discharge. Uterus AV, normal size.  MAU Course  Procedures IUI of frozen -thawed semen done, no complications.   Assessment and Plan  S/p IUI. Left MAU after 30 min rest post procedure. F/up office.   Jessicia Napolitano R 09/17/2011, 4:55 PM

## 2011-09-17 NOTE — MAU Note (Signed)
Dr. Juliene Pina performed placed speculum and performed IUI, patient instructed to continue to lay flat for 30 minutes, pt verbalized an understanding.

## 2011-11-26 ENCOUNTER — Emergency Department (HOSPITAL_COMMUNITY)
Admission: EM | Admit: 2011-11-26 | Discharge: 2011-11-26 | Disposition: A | Discharge status: Home / Self Care | Payer: 59 | Source: Home / Self Care | Attending: Emergency Medicine | Admitting: Emergency Medicine

## 2011-11-26 ENCOUNTER — Encounter (HOSPITAL_COMMUNITY): Payer: Self-pay | Admitting: *Deleted

## 2011-11-26 DIAGNOSIS — R112 Nausea with vomiting, unspecified: Secondary | ICD-10-CM

## 2011-11-26 HISTORY — DX: Gastro-esophageal reflux disease without esophagitis: K21.9

## 2011-11-26 LAB — POCT PREGNANCY, URINE: Preg Test, Ur: NEGATIVE

## 2011-11-26 LAB — POCT I-STAT, CHEM 8
BUN: 9 mg/dL (ref 6–23)
Hemoglobin: 16.3 g/dL — ABNORMAL HIGH (ref 12.0–15.0)
Potassium: 3.9 mEq/L (ref 3.5–5.1)
Sodium: 141 mEq/L (ref 135–145)
TCO2: 23 mmol/L (ref 0–100)

## 2011-11-26 LAB — POCT URINALYSIS DIP (DEVICE)
Bilirubin Urine: NEGATIVE
Glucose, UA: NEGATIVE mg/dL
Specific Gravity, Urine: 1.025 (ref 1.005–1.030)
Urobilinogen, UA: 0.2 mg/dL (ref 0.0–1.0)

## 2011-11-26 MED ORDER — ONDANSETRON HCL 4 MG PO TABS: 4.0000 mg | ORAL_TABLET | Freq: Three times a day (TID) | ORAL | Status: AC | PRN

## 2011-11-26 MED ORDER — ORALYTE PO SOLN: 2.0000 L | Freq: Two times a day (BID) | ORAL | Status: DC

## 2011-11-26 MED ORDER — DIPHENOXYLATE-ATROPINE 2.5-0.025 MG PO TABS: 1.0000 | ORAL_TABLET | Freq: Four times a day (QID) | ORAL | Status: AC | PRN

## 2011-11-26 MED ORDER — ONDANSETRON HCL 4 MG PO TABS: 4.0000 mg | ORAL_TABLET | Freq: Three times a day (TID) | ORAL | Status: DC | PRN

## 2011-11-26 NOTE — ED Notes (Signed)
Pt with onset of n/v/d  abd cramping Thursday - vomiting has subsided - remains nauseated - today 10 episodes of diarrhea - abdominal cramping continuously worse with bm - drinking clear liquids - taking imodium x 3 days twice a day without relief - mother with same symptoms

## 2011-11-26 NOTE — Discharge Instructions (Signed)
If no improvement over the next 48 hours, or AS. discussed pain changes patterns and location should go to the emergency department for further evaluation. Your current symptoms and exam were consistent with possibly a viral gastroenteritis. Your symptoms should be much improved in the next 48 hours   Clear Liquid Diet The clear liquid dietconsists of foods that are liquid or will become liquid at room temperature.You should be able to see through the liquid and beverages. Examples of foods allowed on a clear liquid diet include fruit juice, broth or bouillon, gelatin, or frozen ice pops. The purpose of this diet is to provide necessary fluid, electrolytes such as sodium and potassium, and energy to keep the body functioning during times when you are not able to consume a regular diet.A clear liquid diet should not be continued for long periods of time as it is not nutritionally adequate.  REASONS FOR USING A CLEAR LIQUID DIET  In sudden onset (acute) conditions for a patient before or after surgery.   As the first step in oral feeding.   For fluid and electrolyte replacement in diarrheal diseases.   As a diet before certain medical tests are performed.  ADEQUACY The clear liquid diet is adequate only in ascorbic acid, according to the Recommended Dietary Allowances of the [employer]. CHOOSING FOODS Breads and Starches  Allowed:  None are allowed.   Avoid: All are avoided.  Vegetables  Allowed:  Strained tomato or vegetable juice.   Avoid: Any others.  Fruit  Allowed:  Strained fruit juices and fruit drinks. Include 1 serving of citrus or vitamin C-enriched fruit juice daily.   Avoid: Any others.  Meat and Meat Substitutes  Allowed:  None are allowed.   Avoid: All are avoided.  Milk  Allowed:  None are allowed.   Avoid: All are avoided.  Soups and Combination Foods  Allowed:  Clear bouillon, broth, or strained broth-based soups.   Avoid: Any others.    Desserts and Sweets  Allowed:  Sugar, honey. High protein gelatin. Flavored gelatin, ices, or frozen ice pops that do not contain milk.   Avoid: Any others.  Fats and Oils  Allowed:  None are allowed.   Avoid: All are avoided.  Beverages  Allowed: Cereal beverages, coffee (regular or decaffeinated), tea, or soda at the discretion of your caregiver.   Avoid: Any others.  Condiments  Allowed:  Iodized salt.   Avoid: Any others, including pepper.  Supplements  Allowed:  Liquid nutrition beverages.   Avoid: Any others that contain lactose or fiber.  SAMPLE MEAL PLAN Breakfast  4 oz (120 mL) strained orange juice.    to 1 cup (125 to 250 mL) gelatin (plain or fortified).   1 cup (250 mL) beverage (coffee or tea).   Sugar, if desired.  Midmorning Snack   cup (125 mL) gelatin (plain or fortified).  Lunch  1 cup (250 mL) broth or consomm.   4 oz (120 mL) strained grapefruit juice.    cup (125 mL) gelatin (plain or fortified).   1 cup (250 mL) beverage (coffee or tea).   Sugar, if desired.  Midafternoon Snack   cup (125 mL) fruit ice.    cup (125 mL) strained fruit juice.  Dinner  1 cup (250 mL) broth or consomm.    cup (125 mL) cranberry juice.    cup (125 mL) flavored gelatin (plain or fortified).   1 cup (250 mL) beverage (coffee or tea).   Sugar, if desired.  Evening Snack  4 oz (120 mL) strained apple juice (vitamin C-fortified).    cup (125 mL) flavored gelatin (plain or fortified).  Document Released: 06/19/2005 Document Revised: 06/08/2011 Document Reviewed: 09/16/2010 [facility] Patient Information 2012 [place], [place].

## 2011-11-26 NOTE — ED Provider Notes (Signed)
History     CSN: [id number]  Arrival date & time 11/26/11  1743   First MD Initiated Contact with Patient 11/26/11 1802      Chief Complaint  Patient presents with  . Nausea  . Emesis  . Diarrhea  . Abdominal Pain    (Consider location/radiation/quality/duration/timing/severity/associated sxs/prior treatment) HPI Comments: Both her mother and her have been having symptoms since Thursday her mainly diarrheas and vomiting she has had about 10 episodes of diarrhea intermittent abdominal cramping. She has been able to drink fluids and has been taking abortions 2 help with diarrheas. Patient denies any fevers, or urinary symptoms. They have not traveled financially in the last few months.  Patient is a 31 y.o. female presenting with vomiting, diarrhea, and abdominal pain. The history is provided by the patient and a relative.  Emesis  This is a new problem. The current episode started more than 2 days ago. The problem occurs 5 to 10 times per day. The problem has been gradually worsening. The emesis has an appearance of stomach contents. Associated symptoms include abdominal pain and diarrhea. Pertinent negatives include no arthralgias, no fever, no headaches and no URI. Risk factors include ill contacts.  Diarrhea The primary symptoms include abdominal pain, nausea, vomiting and diarrhea. Primary symptoms do not include fever or arthralgias.  The illness does not include constipation.  Abdominal Pain The primary symptoms of the illness include abdominal pain, nausea, vomiting and diarrhea. The primary symptoms of the illness do not include fever.  Symptoms associated with the illness do not include constipation.    Past Medical History  Diagnosis Date  . Thyroid disease   . Hypothyroid   . Tachycardia   . Cervical adenopathy     reactive, per Dr. Jethro Bolus   . Hypothyroid   . Acid reflux     Past Surgical History  Procedure Date  . Cholecystectomy   . Foot surgery   .  Tonsillectomy   . Cervical lymph node biopsy 02/24/2011    reactive only, per Dr. Violeta Gelinas    Family History  Problem Relation Age of Onset  . Heart disease Mother   . Hypertension Mother   . Breast cancer Maternal Grandmother     History  Substance Use Topics  . Smoking status: Never Smoker   . Smokeless tobacco: Never Used  . Alcohol Use: No    OB History    Grav Para Term Preterm Abortions TAB SAB Ect Mult Living   0               Review of Systems  Constitutional: Positive for activity change and appetite change. Negative for fever.  Gastrointestinal: Positive for nausea, vomiting, abdominal pain and diarrhea. Negative for constipation, blood in stool, abdominal distention and anal bleeding.  Musculoskeletal: Negative for arthralgias.  Neurological: Negative for dizziness and headaches.    Allergies  Esomeprazole magnesium  Home Medications   Current Outpatient Rx  Name Route Sig Dispense Refill  . OMEGA-3 FATTY ACIDS 1000 MG PO CAPS Oral Take 1 g by mouth 2 (two) times daily.     Marland Kitchen LEVOTHYROXINE SODIUM 150 MCG PO TABS Oral Take 1 tablet (150 mcg total) by mouth daily. 90 tablet 3  . METOPROLOL SUCCINATE ER 100 MG PO TB24 Oral Take 1 tablet (100 mg total) by mouth daily. Take with or immediately following a meal. 90 tablet 3  . OMEPRAZOLE 40 MG PO CPDR Oral Take 1 capsule (40 mg total) by mouth  daily. 90 capsule 3  . M-VIT PO Oral Take by mouth.      Marland Kitchen DIPHENOXYLATE-ATROPINE 2.5-0.025 MG PO TABS Oral Take 1 tablet by mouth 4 (four) times daily as needed for diarrhea or loose stools. 30 tablet 0  . ONDANSETRON HCL 4 MG PO TABS Oral Take 1 tablet (4 mg total) by mouth every 8 (eight) hours as needed for nausea. 15 tablet 0  . ORALYTE PO SOLN Oral Take 2 L by mouth 2 (two) times daily. 4 Bottle 0    BP 108/76  Pulse 86  Temp(Src) 98.2 F (36.8 C) (Oral)  Resp 16  SpO2 99%  LMP 10/27/2011  Physical Exam  Nursing note and vitals  reviewed. Constitutional: No distress.  HENT:  Head: Normocephalic.  Eyes: Conjunctivae are normal. Right eye exhibits no discharge. No scleral icterus.  Neck: Neck supple.  Abdominal: Soft. She exhibits no distension and no mass. There is no hepatosplenomegaly or hepatomegaly. There is tenderness in the epigastric area and periumbilical area. There is no rigidity, no rebound, no guarding, no CVA tenderness, no tenderness at McBurney's point and negative Murphy's sign.  Skin: She is not diaphoretic.    ED Course  Procedures (including critical care time)  Labs Reviewed  POCT I-STAT, CHEM 8 - Abnormal; Notable for the following:    Hemoglobin 16.3 (*)    HCT 48.0 (*)    All other components within normal limits  POCT URINALYSIS DIP (DEVICE)  POCT PREGNANCY, URINE   No results found.   1. Nausea and vomiting       MDM  Gastroenteritis for 3 days. Patient does not have significant dehydration. Abdominal exam noted a soft abdomen with generalized tenderness no local or focal. Patient is tolerating oral fluids. Have encouraged to treat her symptoms within the next 48 hours and to modify her diet. Discuss symptoms and will warrant further evaluation members apartment.        Jimmie Molly, MD 11/26/11 [phone]

## 2011-11-29 ENCOUNTER — Telehealth: Payer: Self-pay | Admitting: Oncology

## 2011-11-29 NOTE — Telephone Encounter (Signed)
pt callled and cancelled appt for 0603. pt stated that she will rtn call to r/s appts

## 2011-12-04 ENCOUNTER — Ambulatory Visit: Payer: 59 | Admitting: Oncology

## 2011-12-04 ENCOUNTER — Other Ambulatory Visit: Payer: 59

## 2012-02-05 LAB — OB RESULTS CONSOLE ABO/RH: RH Type: NEGATIVE

## 2012-02-05 LAB — OB RESULTS CONSOLE ANTIBODY SCREEN: Antibody Screen: NEGATIVE

## 2012-02-05 LAB — OB RESULTS CONSOLE HEPATITIS B SURFACE ANTIGEN: Hepatitis B Surface Ag: POSITIVE

## 2012-02-05 LAB — OB RESULTS CONSOLE HIV ANTIBODY (ROUTINE TESTING): HIV: NONREACTIVE

## 2012-05-22 ENCOUNTER — Inpatient Hospital Stay (HOSPITAL_COMMUNITY)
Admission: AD | Admit: 2012-05-22 | Discharge: 2012-05-24 | DRG: 782 | Disposition: A | Discharge status: Home / Self Care | Payer: 59 | Source: Ambulatory Visit | Attending: Obstetrics & Gynecology | Admitting: Obstetrics & Gynecology

## 2012-05-22 ENCOUNTER — Encounter (HOSPITAL_COMMUNITY): Payer: Self-pay

## 2012-05-22 DIAGNOSIS — O30009 Twin pregnancy, unspecified number of placenta and unspecified number of amniotic sacs, unspecified trimester: Secondary | ICD-10-CM | POA: Diagnosis present

## 2012-05-22 DIAGNOSIS — O4410 Placenta previa with hemorrhage, unspecified trimester: Principal | ICD-10-CM

## 2012-05-22 DIAGNOSIS — K5730 Diverticulosis of large intestine without perforation or abscess without bleeding: Secondary | ICD-10-CM

## 2012-05-22 DIAGNOSIS — Z3689 Encounter for other specified antenatal screening: Secondary | ICD-10-CM

## 2012-05-22 DIAGNOSIS — R609 Edema, unspecified: Secondary | ICD-10-CM

## 2012-05-22 DIAGNOSIS — R21 Rash and other nonspecific skin eruption: Secondary | ICD-10-CM

## 2012-05-22 DIAGNOSIS — E039 Hypothyroidism, unspecified: Secondary | ICD-10-CM

## 2012-05-22 DIAGNOSIS — R1084 Generalized abdominal pain: Secondary | ICD-10-CM

## 2012-05-22 DIAGNOSIS — R000 Tachycardia, unspecified: Secondary | ICD-10-CM

## 2012-05-22 DIAGNOSIS — R0989 Other specified symptoms and signs involving the circulatory and respiratory systems: Secondary | ICD-10-CM

## 2012-05-22 DIAGNOSIS — O4400 Placenta previa specified as without hemorrhage, unspecified trimester: Secondary | ICD-10-CM

## 2012-05-22 DIAGNOSIS — K219 Gastro-esophageal reflux disease without esophagitis: Secondary | ICD-10-CM

## 2012-05-22 DIAGNOSIS — R0602 Shortness of breath: Secondary | ICD-10-CM

## 2012-05-22 DIAGNOSIS — K222 Esophageal obstruction: Secondary | ICD-10-CM

## 2012-05-22 DIAGNOSIS — R1013 Epigastric pain: Secondary | ICD-10-CM

## 2012-05-22 DIAGNOSIS — K589 Irritable bowel syndrome without diarrhea: Secondary | ICD-10-CM

## 2012-05-22 DIAGNOSIS — O30049 Twin pregnancy, dichorionic/diamniotic, unspecified trimester: Secondary | ICD-10-CM

## 2012-05-22 DIAGNOSIS — R590 Localized enlarged lymph nodes: Secondary | ICD-10-CM

## 2012-05-22 DIAGNOSIS — R197 Diarrhea, unspecified: Secondary | ICD-10-CM

## 2012-05-22 DIAGNOSIS — K625 Hemorrhage of anus and rectum: Secondary | ICD-10-CM

## 2012-05-22 DIAGNOSIS — R002 Palpitations: Secondary | ICD-10-CM

## 2012-05-22 DIAGNOSIS — E538 Deficiency of other specified B group vitamins: Secondary | ICD-10-CM

## 2012-05-22 HISTORY — DX: Gastro-esophageal reflux disease without esophagitis: K21.9

## 2012-05-22 LAB — CBC
HCT: 39.8 % (ref 36.0–46.0)
MCH: 30.6 pg (ref 26.0–34.0)
MCV: 88.2 fL (ref 78.0–100.0)
Platelets: 237 10*3/uL (ref 150–400)
RBC: 4.51 MIL/uL (ref 3.87–5.11)

## 2012-05-22 MED ORDER — BETAMETHASONE SOD PHOS & ACET 6 (3-3) MG/ML IJ SUSP
12.0000 mg | Freq: Once | INTRAMUSCULAR | Status: AC
  Administered 2012-05-22: 12 mg via INTRAMUSCULAR
  Filled 2012-05-22: qty 2

## 2012-05-22 MED ORDER — ZOLPIDEM TARTRATE 5 MG PO TABS
5.0000 mg | ORAL_TABLET | Freq: Every evening | ORAL | Status: DC | PRN
  Administered 2012-05-22: 5 mg via ORAL
  Filled 2012-05-22: qty 1

## 2012-05-22 MED ORDER — NIFEDIPINE 10 MG PO CAPS
20.0000 mg | ORAL_CAPSULE | Freq: Once | ORAL | Status: AC
  Administered 2012-05-22: 20 mg via ORAL
  Filled 2012-05-22: qty 2

## 2012-05-22 MED ORDER — RHO D IMMUNE GLOBULIN 1500 UNIT/2ML IJ SOLN
300.0000 ug | Freq: Once | INTRAMUSCULAR | Status: AC
  Administered 2012-05-23: 300 ug via INTRAMUSCULAR
  Filled 2012-05-22: qty 2

## 2012-05-22 MED ORDER — LEVOTHYROXINE SODIUM 150 MCG PO TABS
150.0000 ug | ORAL_TABLET | Freq: Every day | ORAL | Status: DC
  Administered 2012-05-22 – 2012-05-24 (×3): 150 ug via ORAL
  Filled 2012-05-22 (×3): qty 1

## 2012-05-22 MED ORDER — ACETAMINOPHEN 325 MG PO TABS
650.0000 mg | ORAL_TABLET | ORAL | Status: DC | PRN
  Administered 2012-05-23: 650 mg via ORAL
  Filled 2012-05-22: qty 2

## 2012-05-22 MED ORDER — NIFEDIPINE 10 MG PO CAPS
10.0000 mg | ORAL_CAPSULE | Freq: Four times a day (QID) | ORAL | Status: DC
  Administered 2012-05-22 – 2012-05-24 (×7): 10 mg via ORAL
  Filled 2012-05-22 (×7): qty 1

## 2012-05-22 MED ORDER — CALCIUM CARBONATE ANTACID 500 MG PO CHEW: 2.0000 | CHEWABLE_TABLET | ORAL | Status: DC | PRN

## 2012-05-22 MED ORDER — DOCUSATE SODIUM 100 MG PO CAPS
100.0000 mg | ORAL_CAPSULE | Freq: Every day | ORAL | Status: DC
  Administered 2012-05-23: 100 mg via ORAL
  Filled 2012-05-22: qty 1

## 2012-05-22 MED ORDER — NIFEDIPINE 10 MG PO CAPS: 10.0000 mg | ORAL_CAPSULE | Freq: Four times a day (QID) | ORAL | Status: DC

## 2012-05-22 MED ORDER — PRENATAL MULTIVITAMIN CH
1.0000 | ORAL_TABLET | Freq: Every day | ORAL | Status: DC
  Administered 2012-05-22 – 2012-05-23 (×2): 1 via ORAL
  Filled 2012-05-22 (×2): qty 1

## 2012-05-22 MED ORDER — METOPROLOL SUCCINATE ER 100 MG PO TB24
100.0000 mg | ORAL_TABLET | Freq: Every day | ORAL | Status: DC
  Administered 2012-05-22 – 2012-05-23 (×2): 100 mg via ORAL
  Filled 2012-05-22 (×3): qty 1

## 2012-05-22 MED ORDER — DEXTROSE IN LACTATED RINGERS 5 % IV SOLN
INTRAVENOUS | Status: DC
  Administered 2012-05-22 – 2012-05-24 (×4): via INTRAVENOUS

## 2012-05-22 MED ORDER — BETAMETHASONE SOD PHOS & ACET 6 (3-3) MG/ML IJ SUSP
12.0000 mg | INTRAMUSCULAR | Status: AC
  Administered 2012-05-23: 12 mg via INTRAMUSCULAR
  Filled 2012-05-22: qty 2

## 2012-05-22 NOTE — Plan of Care (Signed)
Problem: Consults Goal: Birthing Suites Patient Information Press F2 to bring up selections list   Pt < [redacted] weeks EGA     

## 2012-05-22 NOTE — Progress Notes (Signed)
Dr Juliene Pina states that efm can be discontinued. Plans to admit patient to antenatal unit. continous toco, cardio per shift.

## 2012-05-22 NOTE — Progress Notes (Signed)
Dr Juliene Pina notified of patient, tracing, ctx pattern and vital signs. She is aware that patient was coming to MAU. Order received for procardia 20mg  po once and betamethasone 12mg  im first dose. Dr mody will come to see patient in MAU as soon as possible.

## 2012-05-22 NOTE — H&P (Signed)
Jill Baker is a 31 y.o. female G1 presenting at 24 wks with bleeding from Placenta previa.   IVF preg (with donor sperms)- Di-Di twins with twin A complete previa, presenting with unprovoked large amount of fresh red bleeding and mild cramping this morning when woke up.  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 Labs nl, Rh neg. Maternal obesity, Hypothyroid, on Synthroid, Maternal tachycardia on Toprol, GERD on Prevacid.     Maternal Medical History:  Reason for admission: Reason for admission: vaginal bleeding.    OB History    Grav Para Term Preterm Abortions TAB SAB Ect Mult Living   1              Past Medical History  Diagnosis Date  . Thyroid disease   . Hypothyroid   . Tachycardia   . Cervical adenopathy     reactive, per Dr. Jethro Bolus   . Hypothyroid   . Acid reflux   . GERD (gastroesophageal reflux disease)   . PCOS (polycystic ovarian syndrome)    Past Surgical History  Procedure Date  . Cholecystectomy   . Foot surgery   . Tonsillectomy   . Cervical lymph node biopsy 02/24/2011    reactive only, per Dr. Violeta Gelinas   Family History: family history includes Breast cancer in her maternal grandmother; Heart disease in her mother; and Hypertension in her mother. Social History:  reports that she has never smoked. She has never used smokeless tobacco. She reports that she does not drink alcohol or use illicit drugs.   Prenatal Transfer Tool  Maternal Diabetes: No Genetic Screening: Normal Maternal Ultrasounds/Referrals: Normal Fetal Ultrasounds or other Referrals:  None Maternal Substance Abuse:  No Significant Maternal Medications:  Meds include: Protonix Syntroid Other:  Metoprolol Significant Maternal Lab Results:  Lab values include: Rh negative Other Comments:  None  ROS Denies Cp/HA/SOB/LE pain or  swelling/ vaginal fluid leaking    Blood pressure 132/76, pulse 92, temperature 98.3 F (36.8 C), temperature source Oral, resp. rate 18, last menstrual period 10/27/2011, SpO2 99.00%. Exam Physical Exam  A&O x 3, no acute distress. Pleasant HEENT neg Lungs CTA bilat CV RRR, S1S2 normal Abdo soft, non tender, non acute, gravid soft uterus, non tender Extr no edema/ tenderness Pelvic spec exam- no active bleeding but collected blood in vaginal canal is bright red, 5 cc. Cervix appears closed.  FHT  A 150s/ B 150s Toco Irritability noted  Prenatal labs: ABO, Rh: AB negative Antibody: neg Rubella: Immune RPR:  Neg HBsAg: Neg HIV:  Neg GBS:  N/A   Assessment/Plan: G1 Di-Di twins with Twin A complete previa and bleeding -1st episode, does not have clinical evidence of abruption.  Procardia loading 20mg  and 10mg  q 6 hrs BTMZ today and repeat in 24 hrs Strict bedrest, monitor bleeding, FHTs q 4 hrs since cannot trace twins well at 24 wks.  MFM consult and sono in am.  Cont Metoprolol, Synthroid. GERD treatment as needed.     Tirza Senteno R 05/22/2012, 2:56 PM

## 2012-05-22 NOTE — MAU Note (Signed)
Patient is in with c/o new onset vaginal bleeding (that started about an hour ago) and mild cramping that have stopped. She states that she have a known complete placenta previa. Not on bedrest.

## 2012-05-23 ENCOUNTER — Inpatient Hospital Stay (HOSPITAL_COMMUNITY)
Admit: 2012-05-23 | Discharge: 2012-05-23 | Disposition: A | Discharge status: Home / Self Care | Payer: 59 | Attending: Obstetrics & Gynecology | Admitting: Obstetrics & Gynecology

## 2012-05-23 DIAGNOSIS — O30049 Twin pregnancy, dichorionic/diamniotic, unspecified trimester: Secondary | ICD-10-CM

## 2012-05-23 DIAGNOSIS — O4410 Placenta previa with hemorrhage, unspecified trimester: Secondary | ICD-10-CM

## 2012-05-23 LAB — ABO/RH: ABO/RH(D): AB NEG

## 2012-05-23 NOTE — Consult Note (Signed)
MFM consult  31 yr old G1P0 at [redacted]w[redacted]d with dichorionic/diamniotic twin gestation referred by Dr. Juliene Pina for ultrasound and consult for vaginal bleeding. Previous finding of placenta previa.    This is an IVF (donor sperm) pregnancy. Patient reports had an episode of bright red bleeding yesterday which has since resolved. She now has had a small amount of brown discharge. Mild cramping that has been unchanged for several weeks. No leaking of fluid.  Ultrasound today shows: Dichorionic/diamniotic twin gestation; the dividing membrane is seen. Estimated fetal weight for twin A is in the 56th%; twin B is in the 41st%. The fetuses are concordant.  Twin A with an anterior placenta; twin B with a posterior placenta. Twin A placental edge is 1.6cm from the internal cervical os- marginal previa. No retroplacental bleed is seen. Both fetuses have a marginal placental cord insertion.  Normal amniotic fluid volume for both fetuses. Normal transvaginal cervical length of 3cm; no funneling or beaking of the membranes is seen. The views of the profile/nasal bone, nose/lips, cavum, and heart are limited in twin A. The views of the cavum, ductal arch, and palate are limited in twin B.  The remainder of the limited anatomy survey for both fetuses is normal.    I discussed the findings of the ultrasound with the patient and counseled her as follows:   1. Appropriate fetal growth.  2. Dichorionic/diamniotic twin gestation:  - discussed increased risk of preterm labor/PPROM/preterm cervical dilation and preterm delivery- average age of delivery is 81 weeks with dichorionic twins - preterm labor precautions - discussed increased risk of fetal growth restriction- recommend fetal growth every 4 weeks - discussed increased risk of maternal complications including gestational diabetes and preeclampsia as well as need for C section  3. Marginal placenta previa/vaginal bleeding:  - Discussed increased risk of bleeding which  could be severe bleeding; discussed this is the likely etiology of her bleeding although may be due to retroplacental bleed or small abruption - Recommend pelvic rest with no intercourse until resolution of the previa - Recommend no strenuous activity - Discussed bleeding precautions- patient is to call her primary Ob with any vaginal bleeding  - Discussed the majority of previas found at this gestational age resolve by term; however if the previa does not resolved we would recommend delivery via C section at 37 weeks as the risk of vaginal bleeding with labor is increased - Recommend reevaluate placental location on follow up ultrasounds.  Discussed given recent bleeding episode recommend inpatient management until 48 hours without bleeding. May be discharged home at that time with strict precautions as above. Patient is receiving a course of betamethasone- would recommend complete course of betamethasone and would recommend discontinuing procardia once patient is through her steroid window. No evidence of preterm labor at this time; recommend strict precautions. 4. Recommend fetal growth in 4 weeks and reevaluate placental location at that time  5. Limited anatomy survey:  - patient previously had anatomic surveys  - can reevaluate anatomy at follow up visit   I spent 30 minutes in face to face consultation with the patient in addition to time spent on the ultrasound  Results given to Dr. Noel Christmas, MD

## 2012-05-23 NOTE — Progress Notes (Signed)
[facility] ultrasound  Indication: 31 yr old G1P0 at [redacted]w[redacted]d with dichorionic/diamniotic twin gestation and vaginal bleeding. Previous finding of placenta previa.  Findings: 1. Dichorionic/diamniotic twin gestation; the dividing membrane is seen. 2. Estimated fetal weight for twin A is in the 56th%; twin B is in the 41st%. The fetuses are concordant. 3. Twin A with an anterior placenta; twin B with a posterior placenta. Twin A placental edge is 1.6cm from the internal cervical os- marginal previa. No retroplacental bleed is seen. 4. Both fetuses have a marginal placental cord insertion. 5. Normal amniotic fluid volume for both fetuses. 6. Normal transvaginal cervical length of 3cm; no funneling or beaking of the membranes is seen. 7. The views of the profile/nasal bone, nose/lips, cavum, and heart are limited in twin A. 8. The views of the cavum, ductal arch, and palate are limited in twin B. 9. The remainder of the limited anatomy survey for both fetuses is normal.  Recommendations: 1. Appropriate fetal growth. 2. Dichorionic/diamniotic twin gestation: - see consult note 3. Marginal placenta previa/vaginal bleeding: - see consult note 4. Recommend fetal growth in 4 weeks and reevaluate placental location at that time 5. Limited anatomy survey: - patient previously had anatomic surveys - can reevaluate anatomy at follow up visit  Eulis Foster, MD

## 2012-05-23 NOTE — Progress Notes (Signed)
UR done. 

## 2012-05-24 LAB — RH IG WORKUP (INCLUDES ABO/RH)
Fetal Screen: NEGATIVE
Gestational Age(Wks): 24

## 2012-05-24 NOTE — Progress Notes (Signed)
Patient ID: Jill Baker, female   DOB: September 30, 1980, 30 y.o.   MRN: [id number] Pt seen and examined VS stable, no contractions, on low dose procardia No blding Sono- MFM revi and plan discussed. Plan to stay overnightt and D/c home if no bleeding.

## 2012-05-24 NOTE — Progress Notes (Signed)
Patient ID: Jill Baker, female   DOB: Oct 02, 1980, 31 y.o.   MRN: [id number] S: No UCs/ leaking/ any more bleeding. Feels well.  O: NAD, Uterus relaxed, soft, non tender. LE no swelling/ calf tenderness BP 111/61  Pulse 88  Temp 97.7 F (36.5 C) (Oral)  Resp 20  Ht 5\' 11"  (1.803 m)  Wt 255 lb (115.667 kg)  BMI 35.57 kg/m2  SpO2 99%  LMP 12/05/2011 FHR 150s both Toco- none  A/P: 24.2 wks, Di-Di twins, placenta previa, sono reviewed with MFM, placenta previa but almost marginal and small clots noted behind. S/p steroids Dc home, modified bedrest D/c instructions and warning s/s reviewed.  PTL prec, FAC. F/up office 2 wks as scheduled.

## 2012-05-27 NOTE — Discharge Summary (Signed)
Physician Discharge Summary  Patient ID: Jill Baker MRN: [id number] DOB/AGE: 29-Sep-1980 29 y.o.  Admit date: 05/22/2012 Discharge date: 05/24/2012  Admission Diagnoses: 24 wks, Di-Di twins, Complete placenta previa with bleeding  Discharge Diagnoses: Same. Improved.   Discharged Condition: Bleeding resolved. S/p Betamethasone for fetal lung maturity.   Hospital Course: Patient admitted following sudden gush of bleeding at home after waking up in the morning. It was unprovoked bleeding, fresh red in color, She started to have mild cramping and was advised to come in for evaluation. Exam noted fresh blood in vagina without active bleeding from os. Babies were difficult to monitor on continuous monitoring but were check for fetal heart tones every 4 hrs. She received Rhogam since Rh negative and BTMZ on 11/20 and 05/23/12.   Consults: MFM with sono and consult.   Treatments: Betamethasone course and Nifedipine for 48 hrs.   Discharge Exam: Stable.   Disposition: 01-Home or Self Care  Discharge Orders    Future Orders Please Complete By Expires   Diet - low sodium heart healthy      Increase activity slowly      Discharge instructions      Comments:   Pelvic rest, modified bedrest, no lifting. F/up with Dr Juliene Pina as scheduled in Dec for sono and Ob visit       Medication List     As of 05/27/2012  9:24 PM    TAKE these medications         acetaminophen 325 MG tablet   Commonly known as: TYLENOL   Take 650 mg by mouth daily as needed. For pain      fish oil-omega-3 fatty acids 1000 MG capsule   Take 1 g by mouth 2 (two) times daily.      guaiFENesin 100 MG/5ML Soln   Commonly known as: ROBITUSSIN   Take 10 mLs by mouth 2 (two) times daily as needed. For cough      levothyroxine 150 MCG tablet   Commonly known as: SYNTHROID, LEVOTHROID   Take 1 tablet (150 mcg total) by mouth daily.      M-VIT PO   Take 2 tablets by mouth daily.      metoprolol succinate 100 MG 24  hr tablet   Commonly known as: TOPROL-XL   Take 1 tablet (100 mg total) by mouth daily. Take with or immediately following a meal.      omeprazole 40 MG capsule   Commonly known as: PRILOSEC   Take 1 capsule (40 mg total) by mouth daily.           Follow-up Information    Schedule an appointment as soon as possible for a visit with Tadhg Eskew R, MD. (pt has appointment for Ob and sono visit)    Contact information:   [address] [place] [address] [phone]          Signed: Robley Fries 05/27/2012, 9:24 PM

## 2012-07-07 ENCOUNTER — Inpatient Hospital Stay (HOSPITAL_COMMUNITY)
Admission: AD | Admit: 2012-07-07 | Discharge: 2012-07-08 | Disposition: A | Discharge status: Home / Self Care | Payer: 59 | Source: Ambulatory Visit | Attending: Obstetrics & Gynecology | Admitting: Obstetrics & Gynecology

## 2012-07-07 ENCOUNTER — Encounter (HOSPITAL_COMMUNITY): Payer: Self-pay | Admitting: *Deleted

## 2012-07-07 DIAGNOSIS — O30009 Twin pregnancy, unspecified number of placenta and unspecified number of amniotic sacs, unspecified trimester: Secondary | ICD-10-CM | POA: Insufficient documentation

## 2012-07-07 DIAGNOSIS — O99891 Other specified diseases and conditions complicating pregnancy: Secondary | ICD-10-CM | POA: Insufficient documentation

## 2012-07-07 DIAGNOSIS — O30049 Twin pregnancy, dichorionic/diamniotic, unspecified trimester: Secondary | ICD-10-CM | POA: Insufficient documentation

## 2012-07-07 DIAGNOSIS — N949 Unspecified condition associated with female genital organs and menstrual cycle: Secondary | ICD-10-CM | POA: Insufficient documentation

## 2012-07-07 LAB — URINALYSIS, ROUTINE W REFLEX MICROSCOPIC
Bilirubin Urine: NEGATIVE
Glucose, UA: NEGATIVE mg/dL
Hgb urine dipstick: NEGATIVE
Protein, ur: NEGATIVE mg/dL

## 2012-07-07 NOTE — MAU Note (Signed)
Pt G1 at 30.5wks had wet panties about one hour ago, passed mucous earlier today and felt crampy all day.  Twin pregnancy, previa earlier that has resolved.

## 2012-07-08 LAB — POCT FERN TEST: POCT Fern Test: NEGATIVE

## 2012-07-08 NOTE — MAU Provider Note (Signed)
  History     CSN: [id number]  Arrival date and time: 07/07/12 2306   First Provider Initiated Contact with Patient 07/08/12 0015      Chief Complaint  Patient presents with  . Rupture of Membranes  . Abdominal Cramping   HPI 32 yo G1, IVF Di-Di twins, 30.6 wks, noted wet underwear an hour back and nothing since then, Felt a bit more discharge yesterday as well and has been cramping off and on more than her usual today. Was admitted at 24 wks for bleeding from previa, that has moved away from cervix since but she has off and on slight bleeding and is on modified bedrest at home. Good FMs. No bleeding today. No further "leak" of fluid.   Past Medical History  Diagnosis Date  . Thyroid disease   . Hypothyroid   . Tachycardia   . Cervical adenopathy     reactive, per Dr. Jethro Bolus   . Hypothyroid   . Acid reflux   . GERD (gastroesophageal reflux disease)   . PCOS (polycystic ovarian syndrome)     Past Surgical History  Procedure Date  . Cholecystectomy   . Foot surgery   . Tonsillectomy   . Cervical lymph node biopsy 02/24/2011    reactive only, per Dr. Violeta Gelinas    Family History  Problem Relation Age of Onset  . Heart disease Mother   . Hypertension Mother   . Breast cancer Maternal Grandmother     History  Substance Use Topics  . Smoking status: Never Smoker   . Smokeless tobacco: Never Used  . Alcohol Use: No    Allergies:  Allergies  Allergen Reactions  . Esomeprazole Magnesium     REACTION: rash, itching,tachycardia    Prescriptions prior to admission  Medication Sig Dispense Refill  . acetaminophen (TYLENOL) 325 MG tablet Take 650 mg by mouth daily as needed. For pain      . fish oil-omega-3 fatty acids 1000 MG capsule Take 1 g by mouth 2 (two) times daily.       Marland Kitchen guaiFENesin (ROBITUSSIN) 100 MG/5ML SOLN Take 10 mLs by mouth 2 (two) times daily as needed. For cough      . levothyroxine (SYNTHROID, LEVOTHROID) 150 MCG tablet Take 1 tablet (150  mcg total) by mouth daily.  90 tablet  3  . metoprolol succinate (TOPROL-XL) 100 MG 24 hr tablet Take 1 tablet (100 mg total) by mouth daily. Take with or immediately following a meal.  90 tablet  3  . omeprazole (PRILOSEC) 40 MG capsule Take 1 capsule (40 mg total) by mouth daily.  90 capsule  3  . Prenatal Vit-Fe Fumarate-FA (M-VIT PO) Take 2 tablets by mouth daily.         ROS Physical Exam   Blood pressure 111/82, pulse 114, temperature 97.2 F (36.2 C), temperature source Oral, resp. rate 20, height 5\' 11"  (1.803 m), weight 268 lb 3.2 oz (121.655 kg), last menstrual period 12/05/2011.  Physical Exam A&O x 3, no acute distress. Pleasant Abdo soft, non tender, non acute Pelvic spec exam- normal long closed cervix, no pooling, ferning neg. Amniosure sent.  FHT  A and B NST reactive Toco none  MAU Course  Procedures Amnisure test -negative  Assessment and Plan  DiDi twins, 30.6 wks, vaginal discharge, r/o PROM. Twins both NST reactive.  Discharge home, PTL and PROM precautions/warning signs. F/up as scheduled next wk.   Lloyd Ayo R 07/08/2012, 12:38 AM

## 2012-07-23 ENCOUNTER — Inpatient Hospital Stay (HOSPITAL_COMMUNITY)
Admission: AD | Admit: 2012-07-23 | Discharge: 2012-07-23 | Disposition: A | Discharge status: Home / Self Care | Payer: 59 | Source: Ambulatory Visit | Attending: Obstetrics & Gynecology | Admitting: Obstetrics & Gynecology

## 2012-07-23 ENCOUNTER — Encounter (HOSPITAL_COMMUNITY): Payer: Self-pay | Admitting: *Deleted

## 2012-07-23 DIAGNOSIS — O4410 Placenta previa with hemorrhage, unspecified trimester: Secondary | ICD-10-CM

## 2012-07-23 DIAGNOSIS — O30049 Twin pregnancy, dichorionic/diamniotic, unspecified trimester: Secondary | ICD-10-CM

## 2012-07-23 DIAGNOSIS — O30009 Twin pregnancy, unspecified number of placenta and unspecified number of amniotic sacs, unspecified trimester: Secondary | ICD-10-CM | POA: Insufficient documentation

## 2012-07-23 DIAGNOSIS — O4700 False labor before 37 completed weeks of gestation, unspecified trimester: Secondary | ICD-10-CM | POA: Insufficient documentation

## 2012-07-23 LAB — URINALYSIS, ROUTINE W REFLEX MICROSCOPIC
Hgb urine dipstick: NEGATIVE
Nitrite: NEGATIVE
Specific Gravity, Urine: >1.03 — ABNORMAL HIGH (ref 1.005–1.030)
Urobilinogen, UA: 0.2 mg/dL (ref 0.0–1.0)
pH: 6 (ref 5.0–8.0)

## 2012-07-23 LAB — URINE MICROSCOPIC-ADD ON

## 2012-07-23 NOTE — MAU Note (Signed)
Patient states she is having twins. Has been having contractions every 1015 minutes. Is taking Procardia twice a day with the last does at 1600. Denies any bleeding or leaking and reports having felt fetal movement earlier, not as much now. Was 1-2 cm last week.

## 2012-07-23 NOTE — ED Provider Notes (Signed)
History  Jill Baker 32 y.o. G1P0000   33 wks Twin  RP:  Increased UCs this evening  HPI:  Increased UCs at home.  No vaginal bleeding, no fluid leak.  FMs + x 2.  No PEC Sx.  No UTI Sx.  HPP:  Threatened PTL on Procardia and relative rest currently.  BMethasone x 2 received.     Chief Complaint  Patient presents with  . Labor Eval    Past Medical History  Diagnosis Date  . Thyroid disease   . Hypothyroid   . Tachycardia   . Cervical adenopathy     reactive, per Dr. Jethro Bolus   . Hypothyroid   . Acid reflux   . GERD (gastroesophageal reflux disease)   . PCOS (polycystic ovarian syndrome)     Past Surgical History  Procedure Date  . Cholecystectomy   . Foot surgery   . Tonsillectomy   . Cervical lymph node biopsy 02/24/2011    reactive only, per Dr. Violeta Gelinas    Family History  Problem Relation Age of Onset  . Heart disease Mother   . Hypertension Mother   . Breast cancer Maternal Grandmother     History  Substance Use Topics  . Smoking status: Never Smoker   . Smokeless tobacco: Never Used  . Alcohol Use: No    Allergies:  Allergies  Allergen Reactions  . Esomeprazole Magnesium Itching and Rash    ,tachycardia    Prescriptions prior to admission  Medication Sig Dispense Refill  . acetaminophen (TYLENOL) 325 MG tablet Take 650 mg by mouth daily as needed. For pain      . fish oil-omega-3 fatty acids 1000 MG capsule Take 1 g by mouth 2 (two) times daily.       Marland Kitchen guaiFENesin (ROBITUSSIN) 100 MG/5ML SOLN Take 10 mLs by mouth 2 (two) times daily as needed. For cough      . levothyroxine (SYNTHROID, LEVOTHROID) 150 MCG tablet Take 1 tablet (150 mcg total) by mouth daily.  90 tablet  3  . metoprolol succinate (TOPROL-XL) 100 MG 24 hr tablet Take 1 tablet (100 mg total) by mouth daily. Take with or immediately following a meal.  90 tablet  3  . NIFEdipine (PROCARDIA) 10 MG capsule Take 10 mg by mouth every 6 (six) hours.      Marland Kitchen omeprazole (PRILOSEC) 40  MG capsule Take 1 capsule (40 mg total) by mouth daily.  90 capsule  3  . Prenatal Vit-Fe Fumarate-FA (M-VIT PO) Take 2 tablets by mouth daily.         Physical Exam   Blood pressure 112/72, pulse 99, temperature 97.9 F (36.6 C), temperature source Oral, resp. rate 18, height 5\' 10"  (1.778 m), weight 121.927 kg (268 lb 12.8 oz), last menstrual period 12/05/2011, SpO2 99.00%.  FHRs Both B. Lines in 140's, acceleration, no deceleration. No UC on monitoring. VE 1 cm/50%/Vtx/high.  Unchanged c/w VE on last exam at [facility] office by Dr Juliene Pina.  ED Course  No evidence of PTL.   A/P  33 wks Twin with stable cervix.  No evidence of PTL currently.  F-Well-being reassuring x 2.  [name] MD   07/23/2102 at 20:48

## 2012-08-07 ENCOUNTER — Other Ambulatory Visit: Payer: Self-pay | Admitting: Obstetrics & Gynecology

## 2012-08-13 ENCOUNTER — Encounter (HOSPITAL_COMMUNITY): Payer: Self-pay

## 2012-08-13 ENCOUNTER — Encounter (HOSPITAL_COMMUNITY)
Admission: RE | Admit: 2012-08-13 | Discharge: 2012-08-13 | Disposition: A | Discharge status: Home / Self Care | Payer: 59 | Source: Ambulatory Visit | Attending: Obstetrics & Gynecology | Admitting: Obstetrics & Gynecology

## 2012-08-13 LAB — CBC WITH DIFFERENTIAL/PLATELET
Basophils Relative: 0 % (ref 0–1)
Eosinophils Absolute: 0.2 10*3/uL (ref 0.0–0.7)
HCT: 40.1 % (ref 36.0–46.0)
Hemoglobin: 13.6 g/dL (ref 12.0–15.0)
MCH: 30.4 pg (ref 26.0–34.0)
MCHC: 33.9 g/dL (ref 30.0–36.0)
Monocytes Absolute: 1.5 10*3/uL — ABNORMAL HIGH (ref 0.1–1.0)
Monocytes Relative: 13 % — ABNORMAL HIGH (ref 3–12)

## 2012-08-13 NOTE — Pre-Procedure Instructions (Signed)
Pt denies blood dyscrasia, incorrect entry from previous interview., I am unable to remove.

## 2012-08-13 NOTE — Patient Instructions (Addendum)
20 Jill Baker  08/13/2012   Your procedure is scheduled on:  08/16/12  Enter through the Main Entrance of [facility] at 1200 PM  Pick up the phone at the desk and dial [phone].   Call this number if you have problems the morning of surgery: [phone]   Remember:   Do not eat food:After Midnight.  Do not drink clear liquids: 4 Hours before arrival.  Take these medicines the morning of surgery with A SIP OF WATER: Toprol XL Prilosec and Synthroid   Do not wear jewelry, make-up or nail polish.  Do not wear lotions, powders, or perfumes. You may wear deodorant.  Do not shave 48 hours prior to surgery.  Do not bring valuables to the hospital.  Contacts, dentures or bridgework may not be worn into surgery.  Leave suitcase in the car. After surgery it may be brought to your room.  For patients admitted to the hospital, checkout time is 11:00 AM the day of discharge.   Patients discharged the day of surgery will not be allowed to drive home.  Name and phone number of your driver: NA  Special Instructions: Shower using CHG 2 nights before surgery and the night before surgery.  If you shower the day of surgery use CHG.  Use special wash - you have one bottle of CHG for all showers.  You should use approximately 1/3 of the bottle for each shower.   Please read over the following fact sheets that you were given: Surgical Site Infection Prevention

## 2012-08-15 ENCOUNTER — Other Ambulatory Visit (HOSPITAL_COMMUNITY): Payer: 59

## 2012-08-16 ENCOUNTER — Encounter (HOSPITAL_COMMUNITY): Payer: Self-pay | Admitting: Anesthesiology

## 2012-08-16 ENCOUNTER — Encounter (HOSPITAL_COMMUNITY)
Admission: RE | Disposition: A | Discharge status: Home / Self Care | Payer: Self-pay | Source: Ambulatory Visit | Attending: Obstetrics & Gynecology

## 2012-08-16 ENCOUNTER — Inpatient Hospital Stay (HOSPITAL_COMMUNITY): Payer: 59 | Admitting: Anesthesiology

## 2012-08-16 ENCOUNTER — Inpatient Hospital Stay (HOSPITAL_COMMUNITY)
Admission: RE | Admit: 2012-08-16 | Discharge: 2012-08-19 | DRG: 765 | Disposition: A | Discharge status: Home / Self Care | Payer: 59 | Source: Ambulatory Visit | Attending: Obstetrics & Gynecology | Admitting: Obstetrics & Gynecology

## 2012-08-16 DIAGNOSIS — L259 Unspecified contact dermatitis, unspecified cause: Secondary | ICD-10-CM | POA: Diagnosis present

## 2012-08-16 DIAGNOSIS — O99892 Other specified diseases and conditions complicating childbirth: Secondary | ICD-10-CM | POA: Diagnosis present

## 2012-08-16 DIAGNOSIS — O30009 Twin pregnancy, unspecified number of placenta and unspecified number of amniotic sacs, unspecified trimester: Secondary | ICD-10-CM | POA: Diagnosis present

## 2012-08-16 DIAGNOSIS — E039 Hypothyroidism, unspecified: Secondary | ICD-10-CM | POA: Diagnosis present

## 2012-08-16 DIAGNOSIS — E079 Disorder of thyroid, unspecified: Secondary | ICD-10-CM | POA: Diagnosis present

## 2012-08-16 DIAGNOSIS — O468X9 Other antepartum hemorrhage, unspecified trimester: Principal | ICD-10-CM | POA: Diagnosis present

## 2012-08-16 DIAGNOSIS — O30049 Twin pregnancy, dichorionic/diamniotic, unspecified trimester: Secondary | ICD-10-CM

## 2012-08-16 DIAGNOSIS — Z22330 Carrier of Group B streptococcus: Secondary | ICD-10-CM

## 2012-08-16 DIAGNOSIS — O4410 Placenta previa with hemorrhage, unspecified trimester: Secondary | ICD-10-CM

## 2012-08-16 DIAGNOSIS — K219 Gastro-esophageal reflux disease without esophagitis: Secondary | ICD-10-CM | POA: Diagnosis present

## 2012-08-16 LAB — RPR: RPR Ser Ql: NONREACTIVE

## 2012-08-16 SURGERY — Surgical Case
Anesthesia: Spinal | Wound class: Clean Contaminated

## 2012-08-16 MED ORDER — LACTATED RINGERS IV SOLN
INTRAVENOUS | Status: DC | PRN
  Administered 2012-08-16 (×2): via INTRAVENOUS

## 2012-08-16 MED ORDER — METOCLOPRAMIDE HCL 5 MG/ML IJ SOLN: 10.0000 mg | Freq: Three times a day (TID) | INTRAMUSCULAR | Status: DC | PRN

## 2012-08-16 MED ORDER — TETANUS-DIPHTH-ACELL PERTUSSIS 5-2.5-18.5 LF-MCG/0.5 IM SUSP: 0.5000 mL | Freq: Once | INTRAMUSCULAR | Status: DC

## 2012-08-16 MED ORDER — CEFAZOLIN SODIUM-DEXTROSE 2-3 GM-% IV SOLR
INTRAVENOUS | Status: AC
  Filled 2012-08-16: qty 50

## 2012-08-16 MED ORDER — WITCH HAZEL-GLYCERIN EX PADS: 1.0000 "application " | MEDICATED_PAD | CUTANEOUS | Status: DC | PRN

## 2012-08-16 MED ORDER — FENTANYL CITRATE 0.05 MG/ML IJ SOLN
INTRAMUSCULAR | Status: AC
  Filled 2012-08-16: qty 2

## 2012-08-16 MED ORDER — SODIUM CHLORIDE 0.9 % IJ SOLN: 3.0000 mL | INTRAMUSCULAR | Status: DC | PRN

## 2012-08-16 MED ORDER — ZOLPIDEM TARTRATE 5 MG PO TABS: 5.0000 mg | ORAL_TABLET | Freq: Every evening | ORAL | Status: DC | PRN

## 2012-08-16 MED ORDER — SCOPOLAMINE 1 MG/3DAYS TD PT72
1.0000 | MEDICATED_PATCH | Freq: Once | TRANSDERMAL | Status: DC
  Administered 2012-08-16: 1.5 mg via TRANSDERMAL

## 2012-08-16 MED ORDER — METHYLERGONOVINE MALEATE 0.2 MG/ML IJ SOLN
INTRAMUSCULAR | Status: DC | PRN
  Administered 2012-08-16: 0.2 mg via INTRAMUSCULAR

## 2012-08-16 MED ORDER — PHENYLEPHRINE 40 MCG/ML (10ML) SYRINGE FOR IV PUSH (FOR BLOOD PRESSURE SUPPORT)
PREFILLED_SYRINGE | INTRAVENOUS | Status: AC
  Filled 2012-08-16: qty 10

## 2012-08-16 MED ORDER — ONDANSETRON HCL 4 MG/2ML IJ SOLN: 4.0000 mg | INTRAMUSCULAR | Status: DC | PRN

## 2012-08-16 MED ORDER — ONDANSETRON HCL 4 MG/2ML IJ SOLN
4.0000 mg | Freq: Once | INTRAMUSCULAR | Status: AC
  Administered 2012-08-16: 4 mg via INTRAVENOUS

## 2012-08-16 MED ORDER — DIPHENHYDRAMINE HCL 25 MG PO CAPS: 25.0000 mg | ORAL_CAPSULE | ORAL | Status: DC | PRN

## 2012-08-16 MED ORDER — IBUPROFEN 600 MG PO TABS
600.0000 mg | ORAL_TABLET | Freq: Four times a day (QID) | ORAL | Status: DC
  Administered 2012-08-16 – 2012-08-19 (×11): 600 mg via ORAL
  Filled 2012-08-16 (×2): qty 1

## 2012-08-16 MED ORDER — FENTANYL CITRATE 0.05 MG/ML IJ SOLN
INTRAMUSCULAR | Status: DC | PRN
  Administered 2012-08-16: 25 ug via INTRATHECAL

## 2012-08-16 MED ORDER — METOPROLOL SUCCINATE ER 100 MG PO TB24
100.0000 mg | ORAL_TABLET | Freq: Every day | ORAL | Status: DC
  Administered 2012-08-17 – 2012-08-19 (×3): 100 mg via ORAL
  Filled 2012-08-16 (×3): qty 1

## 2012-08-16 MED ORDER — MORPHINE SULFATE 0.5 MG/ML IJ SOLN
INTRAMUSCULAR | Status: AC
  Filled 2012-08-16: qty 10

## 2012-08-16 MED ORDER — SCOPOLAMINE 1 MG/3DAYS TD PT72
MEDICATED_PATCH | TRANSDERMAL | Status: AC
  Administered 2012-08-16: 1.5 mg via TRANSDERMAL
  Filled 2012-08-16: qty 1

## 2012-08-16 MED ORDER — NALBUPHINE HCL 10 MG/ML IJ SOLN
5.0000 mg | INTRAMUSCULAR | Status: DC | PRN
Start: 2012-08-16 — End: 2012-08-19
  Filled 2012-08-16: qty 1

## 2012-08-16 MED ORDER — NALOXONE HCL 0.4 MG/ML IJ SOLN: 0.4000 mg | INTRAMUSCULAR | Status: DC | PRN

## 2012-08-16 MED ORDER — SCOPOLAMINE 1 MG/3DAYS TD PT72
1.0000 | MEDICATED_PATCH | Freq: Once | TRANSDERMAL | Status: DC
  Filled 2012-08-16: qty 1

## 2012-08-16 MED ORDER — ONDANSETRON HCL 4 MG/2ML IJ SOLN
INTRAMUSCULAR | Status: AC
  Administered 2012-08-16: 4 mg via INTRAVENOUS
  Filled 2012-08-16: qty 2

## 2012-08-16 MED ORDER — LACTATED RINGERS IV SOLN
INTRAVENOUS | Status: DC | PRN
  Administered 2012-08-16: 14:00:00 via INTRAVENOUS

## 2012-08-16 MED ORDER — PHENYLEPHRINE HCL 10 MG/ML IJ SOLN
INTRAMUSCULAR | Status: DC | PRN
  Administered 2012-08-16: 120 ug via INTRAVENOUS
  Administered 2012-08-16 (×3): 80 ug via INTRAVENOUS
  Administered 2012-08-16: 160 ug via INTRAVENOUS
  Administered 2012-08-16: 120 ug via INTRAVENOUS
  Administered 2012-08-16: 40 ug via INTRAVENOUS
  Administered 2012-08-16: 200 ug via INTRAVENOUS
  Administered 2012-08-16 (×2): 80 ug via INTRAVENOUS
  Administered 2012-08-16: 120 ug via INTRAVENOUS
  Administered 2012-08-16 (×2): 40 ug via INTRAVENOUS
  Administered 2012-08-16 (×2): 80 ug via INTRAVENOUS

## 2012-08-16 MED ORDER — KETOROLAC TROMETHAMINE 30 MG/ML IJ SOLN: 30.0000 mg | Freq: Four times a day (QID) | INTRAMUSCULAR | Status: AC | PRN

## 2012-08-16 MED ORDER — NALBUPHINE HCL 10 MG/ML IJ SOLN
5.0000 mg | INTRAMUSCULAR | Status: DC | PRN
  Filled 2012-08-16: qty 1

## 2012-08-16 MED ORDER — MENTHOL 3 MG MT LOZG: 1.0000 | LOZENGE | OROMUCOSAL | Status: DC | PRN

## 2012-08-16 MED ORDER — CEFAZOLIN SODIUM-DEXTROSE 2-3 GM-% IV SOLR
2.0000 g | INTRAVENOUS | Status: AC
  Administered 2012-08-16: 2 g via INTRAVENOUS

## 2012-08-16 MED ORDER — ONDANSETRON HCL 4 MG PO TABS
4.0000 mg | ORAL_TABLET | ORAL | Status: DC | PRN
  Administered 2012-08-17 – 2012-08-18 (×2): 4 mg via ORAL
  Filled 2012-08-16 (×2): qty 1

## 2012-08-16 MED ORDER — DIPHENHYDRAMINE HCL 50 MG/ML IJ SOLN: 25.0000 mg | INTRAMUSCULAR | Status: DC | PRN

## 2012-08-16 MED ORDER — OXYCODONE-ACETAMINOPHEN 5-325 MG PO TABS
1.0000 | ORAL_TABLET | ORAL | Status: DC | PRN
  Administered 2012-08-17 (×2): 1 via ORAL
  Administered 2012-08-18: 2 via ORAL
  Administered 2012-08-18 – 2012-08-19 (×2): 1 via ORAL
  Filled 2012-08-16: qty 2
  Filled 2012-08-16: qty 1
  Filled 2012-08-16: qty 2
  Filled 2012-08-16: qty 1

## 2012-08-16 MED ORDER — DIBUCAINE 1 % RE OINT: 1.0000 "application " | TOPICAL_OINTMENT | RECTAL | Status: DC | PRN

## 2012-08-16 MED ORDER — ONDANSETRON HCL 4 MG/2ML IJ SOLN
INTRAMUSCULAR | Status: DC | PRN
  Administered 2012-08-16: 4 mg via INTRAVENOUS

## 2012-08-16 MED ORDER — SIMETHICONE 80 MG PO CHEW
80.0000 mg | CHEWABLE_TABLET | ORAL | Status: DC | PRN
  Administered 2012-08-17 – 2012-08-18 (×3): 80 mg via ORAL

## 2012-08-16 MED ORDER — LACTATED RINGERS IV SOLN
Freq: Once | INTRAVENOUS | Status: AC
  Administered 2012-08-16: 13:00:00 via INTRAVENOUS

## 2012-08-16 MED ORDER — PHENYLEPHRINE 40 MCG/ML (10ML) SYRINGE FOR IV PUSH (FOR BLOOD PRESSURE SUPPORT)
PREFILLED_SYRINGE | INTRAVENOUS | Status: AC
  Filled 2012-08-16: qty 5

## 2012-08-16 MED ORDER — MEPERIDINE HCL 25 MG/ML IJ SOLN: 6.2500 mg | INTRAMUSCULAR | Status: DC | PRN

## 2012-08-16 MED ORDER — PANTOPRAZOLE SODIUM 40 MG PO TBEC
80.0000 mg | DELAYED_RELEASE_TABLET | Freq: Every day | ORAL | Status: DC
  Administered 2012-08-17 – 2012-08-19 (×3): 80 mg via ORAL
  Filled 2012-08-16 (×3): qty 2

## 2012-08-16 MED ORDER — LEVOTHYROXINE SODIUM 150 MCG PO TABS
150.0000 ug | ORAL_TABLET | Freq: Every day | ORAL | Status: DC
  Administered 2012-08-17 – 2012-08-19 (×3): 150 ug via ORAL
  Filled 2012-08-16 (×3): qty 1

## 2012-08-16 MED ORDER — SENNOSIDES-DOCUSATE SODIUM 8.6-50 MG PO TABS
2.0000 | ORAL_TABLET | Freq: Every day | ORAL | Status: DC
  Administered 2012-08-16 – 2012-08-18 (×3): 2 via ORAL

## 2012-08-16 MED ORDER — LANOLIN HYDROUS EX OINT: 1.0000 "application " | TOPICAL_OINTMENT | CUTANEOUS | Status: DC | PRN

## 2012-08-16 MED ORDER — OXYTOCIN 40 UNITS IN LACTATED RINGERS INFUSION - SIMPLE MED: 62.5000 mL/h | INTRAVENOUS | Status: AC

## 2012-08-16 MED ORDER — NALOXONE HCL 1 MG/ML IJ SOLN
1.0000 ug/kg/h | INTRAVENOUS | Status: DC | PRN
  Filled 2012-08-16: qty 2

## 2012-08-16 MED ORDER — DIPHENHYDRAMINE HCL 50 MG/ML IJ SOLN: 12.5000 mg | INTRAMUSCULAR | Status: DC | PRN

## 2012-08-16 MED ORDER — MORPHINE SULFATE (PF) 0.5 MG/ML IJ SOLN
INTRAMUSCULAR | Status: DC | PRN
  Administered 2012-08-16: .15 ug via INTRATHECAL

## 2012-08-16 MED ORDER — KETOROLAC TROMETHAMINE 60 MG/2ML IM SOLN
INTRAMUSCULAR | Status: AC
  Administered 2012-08-16: 60 mg via INTRAMUSCULAR
  Filled 2012-08-16: qty 2

## 2012-08-16 MED ORDER — IBUPROFEN 600 MG PO TABS
600.0000 mg | ORAL_TABLET | Freq: Four times a day (QID) | ORAL | Status: DC | PRN
  Filled 2012-08-16 (×9): qty 1

## 2012-08-16 MED ORDER — DIPHENHYDRAMINE HCL 25 MG PO CAPS: 25.0000 mg | ORAL_CAPSULE | Freq: Four times a day (QID) | ORAL | Status: DC | PRN

## 2012-08-16 MED ORDER — ONDANSETRON HCL 4 MG/2ML IJ SOLN: 4.0000 mg | Freq: Three times a day (TID) | INTRAMUSCULAR | Status: DC | PRN

## 2012-08-16 MED ORDER — KETOROLAC TROMETHAMINE 60 MG/2ML IM SOLN
60.0000 mg | Freq: Once | INTRAMUSCULAR | Status: AC | PRN
  Administered 2012-08-16: 60 mg via INTRAMUSCULAR

## 2012-08-16 MED ORDER — OXYTOCIN 10 UNIT/ML IJ SOLN
40.0000 [IU] | INTRAVENOUS | Status: DC | PRN
  Administered 2012-08-16: 40 [IU] via INTRAVENOUS

## 2012-08-16 MED ORDER — SIMETHICONE 80 MG PO CHEW
80.0000 mg | CHEWABLE_TABLET | Freq: Three times a day (TID) | ORAL | Status: DC
  Administered 2012-08-16 – 2012-08-18 (×4): 80 mg via ORAL

## 2012-08-16 MED ORDER — PRENATAL MULTIVITAMIN CH
1.0000 | ORAL_TABLET | Freq: Every day | ORAL | Status: DC
  Administered 2012-08-17 – 2012-08-19 (×3): 1 via ORAL
  Filled 2012-08-16 (×3): qty 1

## 2012-08-16 MED ORDER — LACTATED RINGERS IV SOLN
INTRAVENOUS | Status: DC
  Administered 2012-08-16: 125 mL/h via INTRAVENOUS
  Administered 2012-08-17: 01:00:00 via INTRAVENOUS

## 2012-08-16 SURGICAL SUPPLY — 44 items
APL SKNCLS STERI-STRIP NONHPOA (GAUZE/BANDAGES/DRESSINGS)
BENZOIN TINCTURE PRP APPL 2/3 (GAUZE/BANDAGES/DRESSINGS) IMPLANT
CLOTH BEACON ORANGE TIMEOUT ST (SAFETY) ×2 IMPLANT
CONTAINER PREFILL 10% NBF 15ML (MISCELLANEOUS) IMPLANT
DRAPE LG THREE QUARTER DISP (DRAPES) ×2 IMPLANT
DRESSING TELFA 8X3 (GAUZE/BANDAGES/DRESSINGS) ×2 IMPLANT
DRSG OPSITE POSTOP 4X10 (GAUZE/BANDAGES/DRESSINGS) ×2 IMPLANT
DURAPREP 26ML APPLICATOR (WOUND CARE) ×2 IMPLANT
ELECT REM PT RETURN 9FT ADLT (ELECTROSURGICAL) ×2
ELECTRODE REM PT RTRN 9FT ADLT (ELECTROSURGICAL) ×1 IMPLANT
EXTRACTOR VACUUM KIWI (MISCELLANEOUS) IMPLANT
EXTRACTOR VACUUM M CUP 4 TUBE (SUCTIONS) IMPLANT
GAUZE SPONGE 4X4 12PLY STRL LF (GAUZE/BANDAGES/DRESSINGS) ×4 IMPLANT
GLOVE BIO SURGEON STRL SZ7 (GLOVE) ×5 IMPLANT
GLOVE BIOGEL PI IND STRL 7.0 (GLOVE) ×2 IMPLANT
GLOVE BIOGEL PI INDICATOR 7.0 (GLOVE) ×2
GOWN PREVENTION PLUS LG XLONG (DISPOSABLE) ×7 IMPLANT
KIT ABG SYR 3ML LUER SLIP (SYRINGE) IMPLANT
NDL HYPO 25X5/8 SAFETYGLIDE (NEEDLE) IMPLANT
NEEDLE HYPO 25X5/8 SAFETYGLIDE (NEEDLE) IMPLANT
NS IRRIG 1000ML POUR BTL (IV SOLUTION) ×2 IMPLANT
PACK C SECTION WH (CUSTOM PROCEDURE TRAY) ×2 IMPLANT
PAD ABD 7.5X8 STRL (GAUZE/BANDAGES/DRESSINGS) ×2 IMPLANT
PAD OB MATERNITY 4.3X12.25 (PERSONAL CARE ITEMS) ×2 IMPLANT
RTRCTR C-SECT PINK 25CM LRG (MISCELLANEOUS) IMPLANT
SLEEVE SCD COMPRESS KNEE MED (MISCELLANEOUS) IMPLANT
SPONGE LAP 18X18 X RAY DECT (DISPOSABLE) ×1 IMPLANT
STAPLER VISISTAT 35W (STAPLE) IMPLANT
STRIP CLOSURE SKIN 1/4X4 (GAUZE/BANDAGES/DRESSINGS) IMPLANT
SUT MNCRL 0 VIOLET CTX 36 (SUTURE) ×3 IMPLANT
SUT MONOCRYL 0 CTX 36 (SUTURE) ×3
SUT PLAIN 0 NONE (SUTURE) IMPLANT
SUT PLAIN 2 0 (SUTURE)
SUT PLAIN 2 0 XLH (SUTURE) ×1 IMPLANT
SUT PLAIN ABS 2-0 CT1 27XMFL (SUTURE) IMPLANT
SUT VIC AB 0 CT1 27 (SUTURE) ×4
SUT VIC AB 0 CT1 27XBRD ANBCTR (SUTURE) ×2 IMPLANT
SUT VIC AB 2-0 CT1 27 (SUTURE) ×4
SUT VIC AB 2-0 CT1 TAPERPNT 27 (SUTURE) ×2 IMPLANT
SUT VIC AB 4-0 KS 27 (SUTURE) IMPLANT
SUT VICRYL 0 TIES 12 18 (SUTURE) IMPLANT
TOWEL OR 17X24 6PK STRL BLUE (TOWEL DISPOSABLE) ×6 IMPLANT
TRAY FOLEY CATH 14FR (SET/KITS/TRAYS/PACK) IMPLANT
WATER STERILE IRR 1000ML POUR (IV SOLUTION) ×2 IMPLANT

## 2012-08-16 NOTE — Anesthesia Procedure Notes (Signed)
Spinal  Patient location during procedure: OR Preanesthetic Checklist Completed: patient identified, site marked, surgical consent, pre-op evaluation, timeout performed, IV checked, risks and benefits discussed and monitors and equipment checked Spinal Block Patient position: sitting Prep: DuraPrep Patient monitoring: heart rate, cardiac monitor, continuous pulse ox and blood pressure Approach: midline Location: L3-4 Injection technique: single-shot Needle Needle type: Sprotte  Needle gauge: 24 G Needle length: 9 cm Assessment Sensory level: T4 Additional Notes Spinal Dosage in OR  Bupivicaine ml       1.9 PFMS04   mcg        150 Fentanyl mcg            25    

## 2012-08-16 NOTE — Transfer of Care (Signed)
Immediate Anesthesia Transfer of Care Note  Patient: Jill Baker  Procedure(s) Performed: Procedure(s): CESAREAN SECTION (N/A)  Patient Location: PACU  Anesthesia Type:Spinal  Level of Consciousness: awake, alert  and oriented  Airway & Oxygen Therapy: Patient Spontanous Breathing  Post-op Assessment: Report given to PACU RN and Post -op Vital signs reviewed and stable  Post vital signs: stable  Complications: No apparent anesthesia complications

## 2012-08-16 NOTE — H&P (Addendum)
Jill Baker is a 32 y.o. female presenting for cesarean delivery of twins at 36.5/7 wks due to preterm contractions, antepartum bleeding and cervical dilatation. She is single mom, donor sperm preg, her mother has been a great support.  [facility] at [facility], Dr Juliene Pina.  IVF preg with donor sperms, Di-Di twins. Complicated by antenatal bleeding since 24 wks. Largest bleed from placenta previa at 24 wks when she was admitted, s/p Betamethasone and Rhogam.  Since then she's had off and on bleeding, however placenta previa has resolved and no clinical or sono findings of abruption noted. She received 2nd course of BTMZ at 32 wks when she started to have UCs and cervical change. She's been on bed rest at home due to ongoing bleeding, UCs and cervical dilatation  Well controlled Hypothyroidism, ANtesting with wk'ly BPP.  Maternal tachycardia hx, well controlled on Metorolol  History OB History   Grav Para Term Preterm Abortions TAB SAB Ect Mult Living   1 0 0 0 0 0 0 0 0 0      Past Medical History  Diagnosis Date  . Thyroid disease   . Hypothyroid   . Tachycardia   . Hypothyroid   . Cervical adenopathy     Pt denies blood dyscrasia  . Acid reflux   . GERD (gastroesophageal reflux disease)    Past Surgical History  Procedure Laterality Date  . Cholecystectomy    . Foot surgery    . Tonsillectomy    . Cervical lymph node biopsy  02/24/2011    reactive only, per Dr. Violeta Gelinas   Family History: family history includes Breast cancer in her maternal grandmother; Heart disease in her mother; and Hypertension in her mother. Social History:  reports that she has never smoked. She has never used smokeless tobacco. She reports that she does not drink alcohol or use illicit drugs.   Prenatal Transfer Tool  Maternal Diabetes: None Genetic Screening: Ultrascreen negative, AFP 1 negative Maternal Ultrasounds/Referrals: Normal anatomy twins (boy, girl), saw MFM for sono once when in house.   Fetal Ultrasounds or other Referrals: none Maternal Substance Abuse:  None Significant Maternal Medications:  Metoprolol, Synthroid. For short period -Nifedipine. S/p Betamethasone 2 rounds, 24 wks, 32 wks.  Significant Maternal Lab Results:  GBS+  Review of Systems  Constitutional: Negative for fever.  Respiratory: Negative for shortness of breath.   Cardiovascular: Negative for chest pain.  Genitourinary: Negative for dysuria.  Neurological: Negative for headaches.     Last menstrual period 12/05/2011. Exam Physical Exam  A&O x 3, no acute distress. Pleasant HEENT neg Lungs CTA bilat CV RRR, S1S2 normal Abdo soft, non tender, non acute Extr no edema/ tenderness Pelvic deferred FHT  Present 140s x 2 Toco none  Prenatal labs: ABO, Rh: --/--/AB NEG, AB NEG (11/20 1725), s/p Rhogam Antibody: NEG (11/20 1725) Rubella: Immune (08/05 0000) RPR: Nonreactive (08/05 0000)  HBsAg: Positive, Negative (08/05 0000)  HIV: Non-reactive (08/05 0000)  GBS:   Positive Glucose screen normal Ultrascreen and AFP1 normal Anatomy sono - normal anatomy- Girl, Boy. Good interval growth.    Assessment/Plan: 32 yo, G1, at 36.3/7 wks, single mother with IVF-Di-Di twins with donor semen. She has been having preterm UCs and dilatation, is here for cesarean delivery.  Risks/complications of surgery reviewed incl infection, bleeding, damage to internal organs including bladder, bowels, ureters, blood vessels, other risks from anesthesia, VTE and delayed complications of any surgery, complications in future surgery reviewed. Also discussed neonatal complications incl difficult delivery, laceration,  vacuum assistance, TTN etc. Pt understands and agrees, all concerns addressed.    Jill Baker R 08/16/2012, 9:09 AM

## 2012-08-16 NOTE — Anesthesia Postprocedure Evaluation (Signed)
  Anesthesia Post-op Note  Patient: Jill Baker  Procedure(s) Performed: Procedure(s): CESAREAN SECTION (N/A)   Patient is awake, responsive, moving her legs, and has signs of resolution of her numbness. Pain and nausea are reasonably well controlled. Vital signs are stable and clinically acceptable. Oxygen saturation is clinically acceptable. There are no apparent anesthetic complications at this time. Patient is ready for discharge.

## 2012-08-16 NOTE — Anesthesia Preprocedure Evaluation (Signed)
Anesthesia Evaluation  Patient identified by MRN, date of birth, ID band Patient awake    Reviewed: Allergy & Precautions, H&P , Patient's Chart, lab work & pertinent test results  Airway Mallampati: II TM Distance: >3 FB Neck ROM: full    Dental no notable dental hx.    Pulmonary  breath sounds clear to auscultation  Pulmonary exam normal       Cardiovascular Exercise Tolerance: Good Rhythm:regular Rate:Normal     Neuro/Psych    GI/Hepatic GERD-  Medicated,  Endo/Other  Morbid obesity  Renal/GU      Musculoskeletal   Abdominal   Peds  Hematology   Anesthesia Other Findings   Reproductive/Obstetrics                           Anesthesia Physical Anesthesia Plan  ASA: III  Anesthesia Plan: Spinal   Post-op Pain Management:    Induction:   Airway Management Planned:   Additional Equipment:   Intra-op Plan:   Post-operative Plan:   Informed Consent: I have reviewed the patients History and Physical, chart, labs and discussed the procedure including the risks, benefits and alternatives for the proposed anesthesia with the patient or authorized representative who has indicated his/her understanding and acceptance.   Dental Advisory Given  Plan Discussed with: CRNA  Anesthesia Plan Comments: (Lab work confirmed with CRNA in room. Platelets okay. Discussed spinal anesthetic, and patient consents to the procedure:  included risk of possible headache,backache, failed block, allergic reaction, and nerve injury. This patient was asked if she had any questions or concerns before the procedure started. )        Anesthesia Quick Evaluation

## 2012-08-16 NOTE — OR Nursing (Signed)
Second Baby Baby Bands placed on Patient's Mother.

## 2012-08-16 NOTE — Op Note (Signed)
Procedure Note 08/16/2012 Jill Baker  Surgery: Primary Low Transverse Cesarean Section, 2 layer closure  Indications: 36.3 wks, Twins, third trimester bleeding, s/p Betamethasone x 2 (2nd rescue dose at 32 wks), preterm uterine contractions and cervical dilatation  Pre-operative Diagnosis: Dichorionic/ diamniotic Twins.   Post-operative Diagnosis: Same   Surgeon: Robley Fries, MD   Assistants: [name], MD    Anesthesia: Spinal  Procedure Details:  The patient was seen in the Holding Room. The risks, benefits, complications, treatment options, and expected outcomes were discussed with the patient. The patient concurred with the proposed plan, giving informed consent. identified as [name] and the procedure verified as C-Section Delivery. A Time Out was held and the above information confirmed. 2 gm Ancef given pre-op. After induction of Spinal anesthesia, the patient was draped and prepped in the usual sterile manner. A Pfannenstiel incision was made and carried down through the subcutaneous tissue to the fascia. Fascial incision was made and extended transversely. The fascia was separated from the underlying rectus tissue superiorly and inferiorly. The peritoneum was identified and entered. Peritoneal incision was extended longitudinally. The utero-vesical peritoneal reflection was incised transversely. A low transverse uterine incision was made. Placental edge was encountered at the incision's upper edge. Amniotomy was performed from under the placental edge. Twin A- BOY was Cephalic, delivered gently, born at 14.09 hrs. Cord clamped and cut, baby handed to NICU team, Apgars 8 and 9 at 1 and 5 minutes. Twin B- GIRL was in upper segment, transverse lie. With amniotic bag intact, one foot was grasped, brought down to the incision and amniotomy performed, second foot grasped and with back kept anterior baby was delivered by Breech Extraction, born at 14.10 hrs, with Apgars 8  and 8 at 1 and 5 minutes.  Placentas were delivered with traction on cords after cord blood collected from each.  The placentas was removed Intact and appeared normal. The uterine outline, tubes and ovaries appeared normal. The uterine incision was closed with running locked sutures of and second layer with same imbricating the first layer with excellent hemostasis. Parietal peritoneum sutured with 2-0 Vicryl. The fascia was then reapproximated with running sutures of 0Vicryl. The subcuticular closure was performed using 2-0plain gut. The skin was closed with 4-0Monocryl. Steristrips. Honeycomb dressing and pressure dressing applied.  Instrument, sponge, and needle counts were correct prior the abdominal closure and were correct at the conclusion of the case.   Findings: Twin A- BOY delivered Cephalic at 14.09 hrs.  Apgars 8 and 9 at 1 and 5 minutes. Twin B- GIRL delivered by Breech Extraction, born at 14.10 hrs, with Apgars 8 and 8 at 1 and 5 minutes.   Estimated Blood Loss: 800 cc  Total IV Fluids: 3800 cc LR  Urine Output: 150CC OF clear urine  Specimens: @ORSPECIMEN @   Complications: no complications  Disposition: PACU - hemodynamically stable.   Maternal Condition: stable   Baby condition / location:  with mother, stable  Attending Attestation: I performed the procedure.   Signed: Surgeon(s): Robley Fries, MD [name], MD

## 2012-08-17 ENCOUNTER — Other Ambulatory Visit: Payer: Self-pay

## 2012-08-17 DIAGNOSIS — O30009 Twin pregnancy, unspecified number of placenta and unspecified number of amniotic sacs, unspecified trimester: Secondary | ICD-10-CM | POA: Diagnosis not present

## 2012-08-17 LAB — CBC
MCV: 90.2 fL (ref 78.0–100.0)
Platelets: 174 10*3/uL (ref 150–400)
RDW: 14.9 % (ref 11.5–15.5)
WBC: 12.5 10*3/uL — ABNORMAL HIGH (ref 4.0–10.5)

## 2012-08-17 MED ORDER — RHO D IMMUNE GLOBULIN 1500 UNIT/2ML IJ SOLN
300.0000 ug | Freq: Once | INTRAMUSCULAR | Status: AC
  Administered 2012-08-17: 300 ug via INTRAVENOUS
  Filled 2012-08-17: qty 2

## 2012-08-17 NOTE — Progress Notes (Addendum)
Subjective: Postpartum Day 1: PLTCS for PTL  at [redacted]w[redacted]d   Di/Di Twins Single parent - sperm donor pregnancy, AB Neg - Rhogam and BTMZ in antenatal phase. Patient reports vomiting, tolerating PO, + flatus and no problems voiding and patient has been out to bathroom to void no complaints, up ad lib without syncope Pain well controlled with po meds Bottle feeding. Mood stable, bonding well.  Objective: Vital signs in last 24 hours: Temp:  [97.4 F (36.3 C)-98.8 F (37.1 C)] 97.7 F (36.5 C) (02/15 0930) Pulse Rate:  [75-105] 89 (02/15 0941) Resp:  [14-21] 18 (02/15 0930) BP: (86-125)/(41-87) 96/56 mmHg (02/15 0941) SpO2:  [95 %-100 %] 96 % (02/15 0930) Weight:  [269 lb (122.018 kg)] 269 lb (122.018 kg) (02/14 2300)  Physical Exam:  General: alert, cooperative and no distress Heart: RRR Lungs: CTAB Abdomen: BS x4, soft, N/T Uterine Fundus: firm, -2/u GU: Voiding with no problems Incision: healing well, no significant drainage, no dehiscence, no significant erythema . Occlusive dressing in place Lochia: appropriate, min DVT Evaluation: No evidence of DVT seen on physical exam. Negative Homan's sign. No cords or calf tenderness. No significant calf/ankle edema.   Recent Labs  08/17/12 0541  HGB 11.3*  HCT 34.0*    Assessment/Plan: Status post Cesarean section. Doing well postoperatively.  Continue current care. Baby Boy - had low blood sugars today and under surveillance.   Jill Baker 08/17/2012, 10:06 AM

## 2012-08-17 NOTE — Anesthesia Postprocedure Evaluation (Signed)
  Anesthesia Post-op Note  Patient: Jill Baker  Procedure(s) Performed: Procedure(s): CESAREAN SECTION (N/A)  Patient Location: Mother/Baby  Anesthesia Type:Spinal  Level of Consciousness: awake, alert  and oriented  Airway and Oxygen Therapy: Patient Spontanous Breathing  Post-op Pain: mild  Post-op Assessment: Patient's Cardiovascular Status Stable, Respiratory Function Stable, Patent Airway, No signs of Nausea or vomiting and Pain level controlled  Post-op Vital Signs: stable  Complications: No apparent anesthesia complications

## 2012-08-18 ENCOUNTER — Encounter (HOSPITAL_COMMUNITY): Payer: Self-pay | Admitting: *Deleted

## 2012-08-18 LAB — RH IG WORKUP (INCLUDES ABO/RH)
ABO/RH(D): AB NEG
Fetal Screen: NEGATIVE

## 2012-08-18 NOTE — Progress Notes (Signed)
Subjective: Postpartum Day 2: Cesarean Delivery for PTL at [redacted]w[redacted]d  Di/ Di Twins, Boy/Girl - single parent, Sperm Donor Pregnancy  Patient reports tolerating PO, + flatus and no problems voiding.  no complaints, up ad lib without syncope Pain well controlled with po meds Bottle feeding Mood stable, bonding well.  Objective: Vital signs in last 24 hours: Temp:  [98.1 F (36.7 C)] 98.1 F (36.7 C) (02/16 0625) Pulse Rate:  [87-94] 94 (02/16 0936) Resp:  [20] 20 (02/16 0625) BP: (87-104)/(50-64) 104/64 mmHg (02/16 0936) SpO2:  [96 %] 96 % (02/15 1330)  Physical Exam:  General: alert, cooperative and no distress Breasts: N/T Heart: RRR Lungs: CTAB Abdomen: BS x4, soft. Uterine Fundus: firm, -2/u Incision: healing well, no significant drainage, no dehiscence, no significant erythema  JP drain:  Lochia: appropriate DVT Evaluation: No evidence of DVT seen on physical exam. Negative Homan's sign. No cords or calf tenderness. No significant calf/ankle edema.   Recent Labs  08/17/12 0541  HGB 11.3*  HCT 34.0*    Assessment/Plan: Status post Cesarean section. Doing well postoperatively.  Patient ambulating in the hall. Baby surveillance of temperatures Plan for discharge tomorrow. Continue current care.   Jill Baker 08/18/2012, 11:14 AM

## 2012-08-19 ENCOUNTER — Encounter (HOSPITAL_COMMUNITY): Payer: Self-pay | Admitting: Obstetrics & Gynecology

## 2012-08-19 LAB — TYPE AND SCREEN
ABO/RH(D): AB NEG
Unit division: 0
Unit division: 0

## 2012-08-19 MED ORDER — OXYCODONE-ACETAMINOPHEN 5-325 MG PO TABS: 1.0000 | ORAL_TABLET | ORAL | Status: DC | PRN

## 2012-08-19 MED ORDER — HYDROCORTISONE 1 % EX CREA: TOPICAL_CREAM | CUTANEOUS | Status: AC

## 2012-08-19 MED ORDER — IBUPROFEN 600 MG PO TABS: 600.0000 mg | ORAL_TABLET | Freq: Four times a day (QID) | ORAL | Status: DC | PRN

## 2012-08-19 NOTE — Discharge Summary (Signed)
POSTOPERATIVE DISCHARGE SUMMARY:  Patient ID: Jill Baker MRN: [id number] DOB/AGE: May 29, 1981 32 y.o.  Admit date: 08/16/2012 Admission Diagnoses: twins (di-di) / bleeding in pregnancy / preterm labor / hypothyroidism / tachycardia - medication controlled   Discharge date:  08/19/2012 Discharge Diagnoses: POD 3 s/p cesarean section - twins / hypothyroidism / tachycardia / GERD / mild contact dermatitis (OR drape - prep)  Prenatal history: [id number]   EDC : 09/10/2012, Alternate EDD Entry  Prenatal care at [facility]  Primary provider : Mody Prenatal course complicated by obesity / hypothyroidism / tachycardia / placenta previa - resolved late gestation / bleeding in pregnancy after resolution of previa (Rhophylac given) / preterm labor  Prenatal Labs: ABO, Rh: AB (08/05 0000) negative Antibody: POS (02/14 1200) Rubella: Immune (08/05 0000)  RPR: NON REACTIVE (02/14 1200)  HBsAg: Positive, Negative (08/05 0000)  HIV: Non-reactive (08/05 0000)  GBS:    1 hr Glucola : NL  Medical / Surgical History :  Past medical history:  Past Medical History  Diagnosis Date  . Thyroid disease   . Hypothyroid   . Tachycardia   . Hypothyroid   . Cervical adenopathy     Pt denies blood dyscrasia  . Acid reflux   . GERD (gastroesophageal reflux disease)     Past surgical history:  Past Surgical History  Procedure Laterality Date  . Cholecystectomy    . Foot surgery    . Tonsillectomy    . Cervical lymph node biopsy  02/24/2011    reactive only, per Dr. Violeta Gelinas    Family History:  Family History  Problem Relation Age of Onset  . Heart disease Mother   . Hypertension Mother   . Breast cancer Maternal Grandmother     Social History:  reports that she has never smoked. She has never used smokeless tobacco. She reports that she does not drink alcohol or use illicit drugs.  Allergies: Esomeprazole magnesium   Current Medications at time of admission:   Synthroid 150 mcg Metoprolol XL 100 mg Prenatal vitamin Prilosec 40 mg  Procedures: Cesarean section delivery of twin newborn by Dr Juliene Pina  See operative report for further details APGAR (1 MIN):    Ronny, Ruddell [[id number]]  8   Lenah, Messenger Quitman [[id number]]  8   APGAR (5 MINS):    Rosabella, Edgin [[id number]]  [address] [[id number]]  8   Postoperative / postpartum course: uncomplicated - discharged POD 3  Physical Exam:   VSS: Temp:  [97.9 F (36.6 C)-98.1 F (36.7 C)] 97.9 F (36.6 C) (02/17 0650) Pulse Rate:  [78-94] 80 (02/17 0650) Resp:  [20] 20 (02/17 0650) BP: (92-110)/(61-64) 92/62 mmHg (02/17 0650) SpO2:  [96 %] 96 % (02/16 1758)  LABS:  Recent Labs  08/17/12 0541  WBC 12.5*  HGB 11.3*  PLT 174    General: feeling well - ambulatory  Heart:RR - no tachycardia Lungs:clear and unlabored Abdomen: pendulous - no edema in panus / erythema rash from Extremities:trace dependent edema Dressing: intact honeycomb Incision:  approximated with subcuticular / no erythema / no ecchymosis / no drainage  Discharge Instructions:  Discharged Condition: stable Activity: pelvic rest and postoperative restrictions x 2 weeks Diet: routine Medications: below   Medication List    TAKE these medications       acetaminophen 325 MG tablet  Commonly known as:  TYLENOL  Take 650 mg by mouth daily as needed. For pain  fish oil-omega-3 fatty acids 1000 MG capsule  Take 1 g by mouth 2 (two) times daily.     guaiFENesin 100 MG/5ML Soln  Commonly known as:  ROBITUSSIN  Take 10 mLs by mouth 2 (two) times daily as needed. For cough     hydrocortisone cream 1 %  Apply to affected area 3 times daily until rash resolved     ibuprofen 600 MG tablet  Commonly known as:  ADVIL,MOTRIN  Take 1 tablet (600 mg total) by mouth every 6 (six) hours as needed.     levothyroxine 150 MCG tablet  Commonly known as:  SYNTHROID, LEVOTHROID  Take  1 tablet (150 mcg total) by mouth daily.     M-VIT PO  Take 1 tablet by mouth 2 (two) times daily.     metoprolol succinate 100 MG 24 hr tablet  Commonly known as:  TOPROL-XL  Take 1 tablet (100 mg total) by mouth daily. Take with or immediately following a meal.     omeprazole 40 MG capsule  Commonly known as:  PRILOSEC  Take 1 capsule (40 mg total) by mouth daily.     oxyCODONE-acetaminophen 5-325 MG per tablet  Commonly known as:  PERCOCET/ROXICET  Take 1-2 tablets by mouth every 4 (four) hours as needed.       Wound Care: dressing removal Wednesday - may call for [facility] nurse apt if prefers Postpartum Instructions: [facility] discharge booklet - instructions reviewed Discharge to: Home  Follow up : [facility] in 6 weeks for routine postpartum visit                      PCP follow-up in 2-4 weeks as scheduled                      Signed: Marlinda Mike CNM, MSN 08/19/2012, 7:50 AM

## 2012-08-19 NOTE — Progress Notes (Signed)
POSTOPERATIVE DAY # 3 S/P cesarean section - twins   S:         Reports feeling well             Tolerating po intake / no nausea / no vomiting / + flatus / + BM             Bleeding is spotting             Pain controlled with motrin and percocet             Up ad lib / ambulatory/ voiding QS  Newborn twins - bottle (formula) feeding    O:  VS: BP 92/62  Pulse 80  Temp(Src) 97.9 F (36.6 C) (Oral)  Resp 20  Wt 122.018 kg (269 lb)  BMI 37.53 kg/m2  SpO2 96%  LMP 12/05/2011   LABS:  Recent Labs  08/17/12 0541  WBC 12.5*  HGB 11.3*  PLT 174               Physical Exam:             Alert and Oriented X3  Lungs: Clear and unlabored  Heart: regular rate and rhythm / no mumurs  Abdomen: soft, non-tender, non-distended, active BS , marked erythema in shape of OR drape              Fundus: firm, non-tender, Ueven             Dressing intact honeycomb              Incision:  approximated with subcuticular /  No erythema / no ecchymosis / no drainage  Perineum: no edema  Lochia: light   Extremities: trace edema, no calf pain or tenderness, negative Homans  A:        POD # 3 S/P cesarean section            Stable status  P:        Routine postoperative care              [facility] booklet             Discussed postpartum care of twins - scheduled feedings / adequate sleep / assistance @home                       Removal of honeycomb dressing Wednesday (call office if prefer nurse to remove)                      Engorgement - care for non-lactation plan     Marlinda Mike CNM, MSN 08/19/2012, 7:42 AM

## 2012-09-02 ENCOUNTER — Encounter (HOSPITAL_COMMUNITY): Payer: Self-pay | Admitting: *Deleted

## 2013-05-08 ENCOUNTER — Other Ambulatory Visit: Payer: Self-pay

## 2014-05-04 ENCOUNTER — Encounter (HOSPITAL_COMMUNITY): Payer: Self-pay | Admitting: *Deleted

## 2014-07-04 ENCOUNTER — Encounter (HOSPITAL_BASED_OUTPATIENT_CLINIC_OR_DEPARTMENT_OTHER): Payer: Self-pay | Admitting: Emergency Medicine

## 2014-07-04 ENCOUNTER — Emergency Department (HOSPITAL_BASED_OUTPATIENT_CLINIC_OR_DEPARTMENT_OTHER)
Admission: EM | Admit: 2014-07-04 | Discharge: 2014-07-04 | Disposition: A | Discharge status: Home / Self Care | Payer: 59 | Attending: Emergency Medicine | Admitting: Emergency Medicine

## 2014-07-04 DIAGNOSIS — K219 Gastro-esophageal reflux disease without esophagitis: Secondary | ICD-10-CM | POA: Diagnosis not present

## 2014-07-04 DIAGNOSIS — Z79899 Other long term (current) drug therapy: Secondary | ICD-10-CM | POA: Insufficient documentation

## 2014-07-04 DIAGNOSIS — E876 Hypokalemia: Secondary | ICD-10-CM | POA: Diagnosis not present

## 2014-07-04 DIAGNOSIS — Z3202 Encounter for pregnancy test, result negative: Secondary | ICD-10-CM | POA: Insufficient documentation

## 2014-07-04 DIAGNOSIS — R197 Diarrhea, unspecified: Secondary | ICD-10-CM | POA: Insufficient documentation

## 2014-07-04 DIAGNOSIS — E079 Disorder of thyroid, unspecified: Secondary | ICD-10-CM | POA: Diagnosis not present

## 2014-07-04 LAB — BASIC METABOLIC PANEL
ANION GAP: 8 (ref 5–15)
BUN: 13 mg/dL (ref 6–23)
CHLORIDE: 109 meq/L (ref 96–112)
CO2: 20 mmol/L (ref 19–32)
Calcium: 7.9 mg/dL — ABNORMAL LOW (ref 8.4–10.5)
Creatinine, Ser: 0.96 mg/dL (ref 0.50–1.10)
GFR calc Af Amer: >90 mL/min (ref >90)
GFR calc non Af Amer: 78 mL/min — ABNORMAL LOW (ref >90)
Glucose, Bld: 112 mg/dL — ABNORMAL HIGH (ref 70–99)
POTASSIUM: 3.3 mmol/L — AB (ref 3.5–5.1)
SODIUM: 137 mmol/L (ref 135–145)

## 2014-07-04 LAB — CBC WITH DIFFERENTIAL/PLATELET
Basophils Absolute: 0 10*3/uL (ref 0.0–0.1)
Basophils Relative: 0 % (ref 0–1)
Eosinophils Absolute: 0.3 10*3/uL (ref 0.0–0.7)
Eosinophils Relative: 3 % (ref 0–5)
HEMATOCRIT: 46.3 % — AB (ref 36.0–46.0)
HEMOGLOBIN: 15.8 g/dL — AB (ref 12.0–15.0)
LYMPHS PCT: 20 % (ref 12–46)
Lymphs Abs: 1.4 10*3/uL (ref 0.7–4.0)
MCH: 28.8 pg (ref 26.0–34.0)
MCHC: 34.1 g/dL (ref 30.0–36.0)
MCV: 84.5 fL (ref 78.0–100.0)
MONO ABS: 1 10*3/uL (ref 0.1–1.0)
MONOS PCT: 13 % — AB (ref 3–12)
NEUTROS ABS: 4.7 10*3/uL (ref 1.7–7.7)
NEUTROS PCT: 64 % (ref 43–77)
Platelets: 213 10*3/uL (ref 150–400)
RBC: 5.48 MIL/uL — AB (ref 3.87–5.11)
RDW: 14.5 % (ref 11.5–15.5)
WBC: 7.4 10*3/uL (ref 4.0–10.5)

## 2014-07-04 LAB — CLOSTRIDIUM DIFFICILE BY PCR: Toxigenic C. Difficile by PCR: NEGATIVE

## 2014-07-04 LAB — URINALYSIS, ROUTINE W REFLEX MICROSCOPIC
BILIRUBIN URINE: NEGATIVE
Glucose, UA: NEGATIVE mg/dL
HGB URINE DIPSTICK: NEGATIVE
Ketones, ur: NEGATIVE mg/dL
Leukocytes, UA: NEGATIVE
NITRITE: NEGATIVE
PROTEIN: NEGATIVE mg/dL
Specific Gravity, Urine: 1.027 (ref 1.005–1.030)
UROBILINOGEN UA: 1 mg/dL (ref 0.0–1.0)
pH: 6 (ref 5.0–8.0)

## 2014-07-04 LAB — PREGNANCY, URINE: PREG TEST UR: NEGATIVE

## 2014-07-04 MED ORDER — ONDANSETRON 4 MG PO TBDP
4.0000 mg | ORAL_TABLET | Freq: Once | ORAL | Status: AC
  Administered 2014-07-04: 4 mg via ORAL
  Filled 2014-07-04: qty 1

## 2014-07-04 MED ORDER — POTASSIUM CHLORIDE CRYS ER 20 MEQ PO TBCR
40.0000 meq | EXTENDED_RELEASE_TABLET | Freq: Once | ORAL | Status: AC
  Administered 2014-07-04: 40 meq via ORAL
  Filled 2014-07-04: qty 2

## 2014-07-04 MED ORDER — SODIUM CHLORIDE 0.9 % IV BOLUS (SEPSIS)
1000.0000 mL | Freq: Once | INTRAVENOUS | Status: AC
  Administered 2014-07-04: 1000 mL via INTRAVENOUS

## 2014-07-04 NOTE — ED Provider Notes (Signed)
CSN: [id number]     Arrival date & time 07/04/14  1127 History   First MD Initiated Contact with Patient 07/04/14 1148     Chief Complaint  Patient presents with  . Diarrhea     (Consider location/radiation/quality/duration/timing/severity/associated sxs/prior Treatment) HPI  34 year old female presents to the ER with 3 days of diarrhea and intermittent abdominal cramping. Cramping is in the middle of her abdomen. Patient originally had nausea and vomiting, but has not had vomiting a couple days. Nausea still present. Feels like every time she drinks water run straight through her. She's having diarrhea numerous times per day. Is watery without discharge, mucus, or blood. No fevers. Patient has multiple sick contacts at home that had similar symptoms but there is a resolved. No recent antibiotic use. Patient is a SICU nurse. Patient denies any recent travel or camping. No obvious food pathology that could've caused this. Has been trying Imodium at home with no relief.  Past Medical History  Diagnosis Date  . Thyroid disease   . Hypothyroid   . Tachycardia   . Hypothyroid   . Cervical adenopathy     Pt denies blood dyscrasia  . Acid reflux   . GERD (gastroesophageal reflux disease)    Past Surgical History  Procedure Laterality Date  . Cholecystectomy    . Foot surgery    . Tonsillectomy    . Cervical lymph node biopsy  02/24/2011    reactive only, per Dr. Georganna Skeans  . Cesarean section N/A 08/16/2012    Procedure: CESAREAN SECTION;  Surgeon: Elveria Royals, MD;  Location: [facility];  Service: Obstetrics;  Laterality: N/A;   Family History  Problem Relation Age of Onset  . Heart disease Mother   . Hypertension Mother   . Breast cancer Maternal Grandmother    History  Substance Use Topics  . Smoking status: Never Smoker   . Smokeless tobacco: Never Used  . Alcohol Use: No   OB History    Gravida Para Term Preterm AB TAB SAB Ectopic Multiple Living   1 1 0 1 0 0 0 0 1 2       Review of Systems  Constitutional: Negative for fever.  Gastrointestinal: Positive for nausea, abdominal pain and diarrhea. Negative for blood in stool.  Musculoskeletal: Negative for back pain.  All other systems reviewed and are negative.     Allergies  Esomeprazole magnesium  Home Medications   Prior to Admission medications   Medication Sig Start Date End Date Taking? Authorizing Provider  levothyroxine (SYNTHROID, LEVOTHROID) 150 MCG tablet Take 1 tablet (150 mcg total) by mouth daily. 08/11/11  Yes Laurey Morale, MD  metoprolol succinate (TOPROL-XL) 100 MG 24 hr tablet Take 1 tablet (100 mg total) by mouth daily. Take with or immediately following a meal. 08/11/11  Yes Laurey Morale, MD  omeprazole (PRILOSEC) 40 MG capsule Take 1 capsule (40 mg total) by mouth daily. 08/11/11  Yes Laurey Morale, MD  acetaminophen (TYLENOL) 325 MG tablet Take 650 mg by mouth daily as needed. For pain    Historical Provider, MD  fish oil-omega-3 fatty acids 1000 MG capsule Take 1 g by mouth 2 (two) times daily.     Historical Provider, MD  guaiFENesin (ROBITUSSIN) 100 MG/5ML SOLN Take 10 mLs by mouth 2 (two) times daily as needed. For cough    Historical Provider, MD  ibuprofen (ADVIL,MOTRIN) 600 MG tablet Take 1 tablet (600 mg total) by mouth every 6 (six) hours as needed.  08/19/12   Artelia Laroche, CNM  oxyCODONE-acetaminophen (PERCOCET/ROXICET) 5-325 MG per tablet Take 1-2 tablets by mouth every 4 (four) hours as needed. 08/19/12   Artelia Laroche, CNM  Prenatal Vit-Fe Fumarate-FA (M-VIT PO) Take 1 tablet by mouth 2 (two) times daily.     Historical Provider, MD   BP 117/77 mmHg  Pulse 96  Temp(Src) 97.9 F (36.6 C) (Oral)  Resp 20  Ht 5\' 11"  (1.803 m)  Wt 250 lb (113.399 kg)  BMI 34.88 kg/m2  SpO2 97%  LMP 12/03/2011 Physical Exam  Constitutional: She is oriented to person, place, and time. She appears well-developed and well-nourished.  HENT:  Head: Normocephalic and atraumatic.  Right Ear:  External ear normal.  Left Ear: External ear normal.  Nose: Nose normal.  Eyes: Right eye exhibits no discharge. Left eye exhibits no discharge.  Cardiovascular: Normal rate, regular rhythm and normal heart sounds.   Pulmonary/Chest: Effort normal and breath sounds normal.  Abdominal: Soft. She exhibits no distension. There is tenderness (mild) in the left lower quadrant.  Neurological: She is alert and oriented to person, place, and time.  Skin: Skin is warm and dry.  Nursing note and vitals reviewed.   ED Course  Procedures (including critical care time) Labs Review Labs Reviewed  URINALYSIS, ROUTINE W REFLEX MICROSCOPIC - Abnormal; Notable for the following:    Color, Urine AMBER (*)    APPearance CLOUDY (*)    All other components within normal limits  BASIC METABOLIC PANEL - Abnormal; Notable for the following:    Potassium 3.3 (*)    Glucose, Bld 112 (*)    Calcium 7.9 (*)    GFR calc non Af Amer 78 (*)    All other components within normal limits  CBC WITH DIFFERENTIAL - Abnormal; Notable for the following:    RBC 5.48 (*)    Hemoglobin 15.8 (*)    HCT 46.3 (*)    Monocytes Relative 13 (*)    All other components within normal limits  CLOSTRIDIUM DIFFICILE BY PCR  STOOL CULTURE  PREGNANCY, URINE    Imaging Review No results found.   EKG Interpretation None      MDM   Final diagnoses:  Diarrhea  Hypokalemia    Patient feels better with IV fluids. 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 Difficile, though with multiple sick contacts at feel C. Difficile is less likely. She has mild hypokalemia, replace with one dose of oral K Dur here. Minimal abdominal tenderness, do not feel she needs imaging at this time as I feel this is all due to the diarrhea. She will try Pepto-Bismol at home and does not want to try other medicines and will let the diarrhea run its course. Discussed strict return precautions and she verbalized  understanding and will follow-up with PCP.    Ephraim Hamburger, MD 07/04/14 1318

## 2014-07-04 NOTE — ED Notes (Signed)
Pt presents to ED with complaints of diarrhea and abdominal cramps for 3 days. Pt states her family at home have all had the same virus. Pt has been taking imodium at home.

## 2014-07-04 NOTE — Discharge Instructions (Signed)
Diarrhea Diarrhea is frequent loose and watery bowel movements. It can cause you to feel weak and dehydrated. Dehydration can cause you to become tired and thirsty, have a dry mouth, and have decreased urination that often is dark yellow. Diarrhea is a sign of another problem, most often an infection that will not last long. In most cases, diarrhea typically lasts 2-3 days. However, it can last longer if it is a sign of something more serious. It is important to treat your diarrhea as directed by your caregiver to lessen or prevent future episodes of diarrhea. CAUSES  Some common causes include:  Gastrointestinal infections caused by viruses, bacteria, or parasites.  Food poisoning or food allergies.  Certain medicines, such as antibiotics, chemotherapy, and laxatives.  Artificial sweeteners and fructose.  Digestive disorders. HOME CARE INSTRUCTIONS  Ensure adequate fluid intake (hydration): Have 1 cup (8 oz) of fluid for each diarrhea episode. Avoid fluids that contain simple sugars or sports drinks, fruit juices, whole milk products, and sodas. Your urine should be clear or pale yellow if you are drinking enough fluids. Hydrate with an oral rehydration solution that you can purchase at pharmacies, retail stores, and online. You can prepare an oral rehydration solution at home by mixing the following ingredients together:   - tsp table salt.   tsp baking soda.   tsp salt substitute containing potassium chloride.  1  tablespoons sugar.  1 L (34 oz) of water.  Certain foods and beverages may increase the speed at which food moves through the gastrointestinal (GI) tract. These foods and beverages should be avoided and include:  Caffeinated and alcoholic beverages.  High-fiber foods, such as raw fruits and vegetables, nuts, seeds, and whole grain breads and cereals.  Foods and beverages sweetened with sugar alcohols, such as xylitol, sorbitol, and mannitol.  Some foods may be well  tolerated and may help thicken stool including:  Starchy foods, such as rice, toast, pasta, low-sugar cereal, oatmeal, grits, baked potatoes, crackers, and bagels.  Bananas.  Applesauce.  Add probiotic-rich foods to help increase healthy bacteria in the GI tract, such as yogurt and fermented milk products.  Wash your hands well after each diarrhea episode.  Only take over-the-counter or prescription medicines as directed by your caregiver.  Take a warm bath to relieve any burning or pain from frequent diarrhea episodes. SEEK IMMEDIATE MEDICAL CARE IF:   You are unable to keep fluids down.  You have persistent vomiting.  You have blood in your stool, or your stools are black and tarry.  You do not urinate in 6-8 hours, or there is only a small amount of very dark urine.  You have abdominal pain that increases or localizes.  You have weakness, dizziness, confusion, or light-headedness.  You have a severe headache.  Your diarrhea gets worse or does not get better.  You have a fever or persistent symptoms for more than 2-3 days.  You have a fever and your symptoms suddenly get worse. MAKE SURE YOU:   Understand these instructions.  Will watch your condition.  Will get help right away if you are not doing well or get worse. Document Released: 06/09/2002 Document Revised: 11/03/2013 Document Reviewed: 02/25/2012 [facility] Patient Information 2015 [place], [place]. This information is not intended to replace advice given to you by your health care provider. Make sure you discuss any questions you have with your health care provider.   Hypokalemia Hypokalemia means that the amount of potassium in the blood is lower than normal.Potassium  is a chemical, called an electrolyte, that helps regulate the amount of fluid in the body. It also stimulates muscle contraction and helps nerves function properly.Most of the body's potassium is inside of cells, and only a very small amount is  in the blood. Because the amount in the blood is so small, minor changes can be life-threatening. CAUSES  Antibiotics.  Diarrhea or vomiting.  Using laxatives too much, which can cause diarrhea.  Chronic kidney disease.  Water pills (diuretics).  Eating disorders (bulimia).  Low magnesium level.  Sweating a lot. SIGNS AND SYMPTOMS  Weakness.  Constipation.  Fatigue.  Muscle cramps.  Mental confusion.  Skipped heartbeats or irregular heartbeat (palpitations).  Tingling or numbness. DIAGNOSIS  Your health care provider can diagnose hypokalemia with blood tests. In addition to checking your potassium level, your health care provider may also check other lab tests. TREATMENT Hypokalemia can be treated with potassium supplements taken by mouth or adjustments in your current medicines. If your potassium level is very low, you may need to get potassium through a vein (IV) and be monitored in the hospital. A diet high in potassium is also helpful. Foods high in potassium are:  Nuts, such as peanuts and pistachios.  Seeds, such as sunflower seeds and pumpkin seeds.  Peas, lentils, and lima beans.  Whole grain and bran cereals and breads.  Fresh fruit and vegetables, such as apricots, avocado, bananas, cantaloupe, kiwi, oranges, tomatoes, asparagus, and potatoes.  Orange and tomato juices.  Red meats.  Fruit yogurt. HOME CARE INSTRUCTIONS  Take all medicines as prescribed by your health care provider.  Maintain a healthy diet by including nutritious food, such as fruits, vegetables, nuts, whole grains, and lean meats.  If you are taking a laxative, be sure to follow the directions on the label. SEEK MEDICAL CARE IF:  Your weakness gets worse.  You feel your heart pounding or racing.  You are vomiting or having diarrhea.  You are diabetic and having trouble keeping your blood glucose in the normal range. SEEK IMMEDIATE MEDICAL CARE IF:  You have chest pain,  shortness of breath, or dizziness.  You are vomiting or having diarrhea for more than 2 days.  You faint. MAKE SURE YOU:   Understand these instructions.  Will watch your condition.  Will get help right away if you are not doing well or get worse. Document Released: 06/19/2005 Document Revised: 04/09/2013 Document Reviewed: 12/20/2012 [facility] Patient Information 2015 [place], [place]. This information is not intended to replace advice given to you by your health care provider. Make sure you discuss any questions you have with your health care provider.

## 2014-07-07 LAB — STOOL CULTURE

## 2014-09-08 ENCOUNTER — Ambulatory Visit (INDEPENDENT_AMBULATORY_CARE_PROVIDER_SITE_OTHER): Payer: 59 | Admitting: Family Medicine

## 2014-09-08 ENCOUNTER — Encounter: Payer: Self-pay | Admitting: Family Medicine

## 2014-09-08 VITALS — BP 112/72 | HR 81 | Temp 98.0°F | Ht 71.0 in | Wt 267.0 lb

## 2014-09-08 DIAGNOSIS — B084 Enteroviral vesicular stomatitis with exanthem: Secondary | ICD-10-CM

## 2014-09-08 NOTE — Progress Notes (Signed)
   Subjective:    Patient ID: Jill Baker, female    DOB: [birth year], 34 y.o.   MRN: [id number]  HPI Here to follow up a case of hand-foot-mouth disease and to get clearance to return to work. She is a surgical ICU nurse. Her children became ill with this virus about 10 days ago and she followed a day later. She had fevers up to 104 degrees, mouth ulcers and blisters on the hands and feet. No cough or NVD. She drank fluids and took Motrin. She did go to Urgent Care once and got some triamcinolone cream to use on her hands. She now feels completely back to normal except she still has some lesions on the hands that are drying up.    Review of Systems  Constitutional: Negative.   Respiratory: Negative.   Cardiovascular: Negative.   Gastrointestinal: Negative.   Skin: Positive for rash.       Objective:   Physical Exam  Constitutional: She appears well-developed and well-nourished.  Skin:  Both hands have numerous flat lesions which are blister remnants, these are scaly and dry           Assessment & Plan:  Resolving hand-foot-mouth disease. She is cleared to return to work tomorrow.

## 2014-09-08 NOTE — Progress Notes (Signed)
Pre visit review using our clinic review tool, if applicable. No additional management support is needed unless otherwise documented below in the visit note. 

## 2014-09-10 ENCOUNTER — Encounter: Payer: Self-pay | Admitting: Family Medicine

## 2014-09-10 ENCOUNTER — Telehealth: Payer: Self-pay | Admitting: Family Medicine

## 2014-09-10 DIAGNOSIS — Z0279 Encounter for issue of other medical certificate: Secondary | ICD-10-CM

## 2014-09-10 NOTE — Telephone Encounter (Signed)
Patient would like to know if Dr. Sarajane Jews can extend her out of work date an additional week?  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   Please give her a callback.

## 2014-09-10 NOTE — Telephone Encounter (Signed)
Left a message for pt to return call 

## 2014-09-10 NOTE — Telephone Encounter (Signed)
Called and spoke with pt and pt is aware letter will be ready today for pick up.  Work note up front.

## 2014-09-10 NOTE — Telephone Encounter (Signed)
Yes, please excuse her for another week

## 2014-09-24 ENCOUNTER — Encounter: Payer: Self-pay | Admitting: Family Medicine

## 2014-09-24 DIAGNOSIS — B084 Enteroviral vesicular stomatitis with exanthem: Secondary | ICD-10-CM | POA: Insufficient documentation

## 2014-09-28 ENCOUNTER — Telehealth: Payer: Self-pay | Admitting: Family Medicine

## 2014-09-28 NOTE — Telephone Encounter (Signed)
I faxed paper work to below number.

## 2014-09-28 NOTE — Telephone Encounter (Signed)
Pt called and said the fax number for [facility] is  [phone]

## 2014-10-10 ENCOUNTER — Telehealth: Payer: Self-pay | Admitting: Nurse Practitioner

## 2014-10-10 DIAGNOSIS — J0100 Acute maxillary sinusitis, unspecified: Secondary | ICD-10-CM

## 2014-10-10 MED ORDER — AZITHROMYCIN 250 MG PO TABS: ORAL_TABLET | ORAL | Status: DC

## 2014-10-10 NOTE — Progress Notes (Signed)

## 2014-12-11 ENCOUNTER — Encounter: Payer: Self-pay | Admitting: Internal Medicine

## 2015-05-08 ENCOUNTER — Telehealth: Payer: 59 | Admitting: Nurse Practitioner

## 2015-05-08 DIAGNOSIS — R05 Cough: Secondary | ICD-10-CM

## 2015-05-08 DIAGNOSIS — R059 Cough, unspecified: Secondary | ICD-10-CM

## 2015-05-08 MED ORDER — AZITHROMYCIN 250 MG PO TABS: ORAL_TABLET | ORAL | Status: DC

## 2015-05-08 NOTE — Progress Notes (Signed)

## 2015-07-09 ENCOUNTER — Ambulatory Visit (INDEPENDENT_AMBULATORY_CARE_PROVIDER_SITE_OTHER): Payer: 59 | Admitting: Family Medicine

## 2015-07-09 ENCOUNTER — Encounter: Payer: Self-pay | Admitting: Family Medicine

## 2015-07-09 VITALS — BP 110/83 | HR 80 | Temp 98.2°F

## 2015-07-09 DIAGNOSIS — Z91048 Other nonmedicinal substance allergy status: Secondary | ICD-10-CM

## 2015-07-09 DIAGNOSIS — R5382 Chronic fatigue, unspecified: Secondary | ICD-10-CM | POA: Diagnosis not present

## 2015-07-09 DIAGNOSIS — Z9109 Other allergy status, other than to drugs and biological substances: Secondary | ICD-10-CM

## 2015-07-09 LAB — CBC WITH DIFFERENTIAL/PLATELET
BASOS ABS: 0 10*3/uL (ref 0.0–0.1)
Basophils Relative: 0.5 % (ref 0.0–3.0)
EOS ABS: 0.3 10*3/uL (ref 0.0–0.7)
Eosinophils Relative: 3.6 % (ref 0.0–5.0)
HEMATOCRIT: 47.3 % — AB (ref 36.0–46.0)
HEMOGLOBIN: 15.7 g/dL — AB (ref 12.0–15.0)
LYMPHS PCT: 23.3 % (ref 12.0–46.0)
Lymphs Abs: 1.9 10*3/uL (ref 0.7–4.0)
MCHC: 33.2 g/dL (ref 30.0–36.0)
MCV: 87.2 fl (ref 78.0–100.0)
Monocytes Absolute: 0.8 10*3/uL (ref 0.1–1.0)
Monocytes Relative: 10.1 % (ref 3.0–12.0)
Neutro Abs: 5.1 10*3/uL (ref 1.4–7.7)
Neutrophils Relative %: 62.5 % (ref 43.0–77.0)
PLATELETS: 244 10*3/uL (ref 150.0–400.0)
RBC: 5.43 Mil/uL — AB (ref 3.87–5.11)
RDW: 14.4 % (ref 11.5–15.5)
WBC: 8.2 10*3/uL (ref 4.0–10.5)

## 2015-07-09 LAB — BASIC METABOLIC PANEL
BUN: 16 mg/dL (ref 6–23)
CALCIUM: 9.4 mg/dL (ref 8.4–10.5)
CO2: 26 mEq/L (ref 19–32)
Chloride: 105 mEq/L (ref 96–112)
Creatinine, Ser: 0.92 mg/dL (ref 0.40–1.20)
GFR: 74.72 mL/min (ref >60.00)
GLUCOSE: 103 mg/dL — AB (ref 70–99)
Potassium: 4.5 mEq/L (ref 3.5–5.1)
SODIUM: 140 meq/L (ref 135–145)

## 2015-07-09 LAB — POCT URINALYSIS DIPSTICK
BILIRUBIN UA: NEGATIVE
GLUCOSE UA: NEGATIVE
Ketones, UA: NEGATIVE
Leukocytes, UA: NEGATIVE
Nitrite, UA: NEGATIVE
Protein, UA: NEGATIVE
RBC UA: NEGATIVE
SPEC GRAV UA: 1.025
Urobilinogen, UA: 0.2
pH, UA: 5

## 2015-07-09 LAB — HEPATIC FUNCTION PANEL
ALBUMIN: 4.1 g/dL (ref 3.5–5.2)
ALT: 42 U/L — AB (ref 0–35)
AST: 27 U/L (ref 0–37)
Alkaline Phosphatase: 66 U/L (ref 39–117)
Bilirubin, Direct: 0.1 mg/dL (ref 0.0–0.3)
TOTAL PROTEIN: 6.8 g/dL (ref 6.0–8.3)
Total Bilirubin: 0.6 mg/dL (ref 0.2–1.2)

## 2015-07-09 LAB — TSH: TSH: 4.25 u[IU]/mL (ref 0.35–4.50)

## 2015-07-09 LAB — VITAMIN B12: Vitamin B-12: 273 pg/mL (ref 211–911)

## 2015-07-09 NOTE — Progress Notes (Signed)
   Subjective:    Patient ID: Jill Baker, female    DOB: December 14, 1980, 35 y.o.   MRN: [id number]  HPI Here for several things. First se has felt very fatigued for about 6 months and she is not sure why. Her thyroid level has not been checked for quite a while. Also for 6 weeks she has had stuffy head, PND, a hoarseness. No cough or ST or fever. She was given a Zpack last month during a E visit, but this did not help.   Review of Systems  Constitutional: Positive for fatigue. Negative for fever.  HENT: Positive for congestion, postnasal drip, sinus pressure and voice change. Negative for ear pain and sore throat.   Eyes: Negative.   Respiratory: Negative.   Cardiovascular: Negative.   Neurological: Negative.        Objective:   Physical Exam  Constitutional: She is oriented to person, place, and time. She appears well-developed and well-nourished. No distress.  HENT:  Right Ear: External ear normal.  Left Ear: External ear normal.  Nose: Nose normal.  Mouth/Throat: Oropharynx is clear and moist.  Eyes: Conjunctivae are normal.  Neck: Neck supple. No thyromegaly present.  Cardiovascular: Normal rate, regular rhythm, normal heart sounds and intact distal pulses.   Pulmonary/Chest: Effort normal and breath sounds normal.  Lymphadenopathy:    She has no cervical adenopathy.  Neurological: She is alert and oriented to person, place, and time.          Assessment & Plan:  She seems to have some allergies, so I suggested she try Flonase and Zyrtec daily OTC. For the fatigue we will screen with labs today including a TSH.

## 2015-07-09 NOTE — Progress Notes (Signed)
Pre visit review using our clinic review tool, if applicable. No additional management support is needed unless otherwise documented below in the visit note. Pt declined to weigh 

## 2015-07-20 ENCOUNTER — Telehealth: Payer: 59 | Admitting: Nurse Practitioner

## 2015-07-20 DIAGNOSIS — R112 Nausea with vomiting, unspecified: Secondary | ICD-10-CM

## 2015-07-20 DIAGNOSIS — R197 Diarrhea, unspecified: Secondary | ICD-10-CM

## 2015-07-20 MED ORDER — ONDANSETRON HCL 4 MG PO TABS: 4.0000 mg | ORAL_TABLET | Freq: Three times a day (TID) | ORAL | Status: DC | PRN

## 2015-07-20 NOTE — Progress Notes (Unsigned)
We are sorry that you are not feeling well.  Here is how we plan to help!  Based on what you have shared with me it looks like you have Acute Infectious Diarrhea.  Most cases of acute diarrhea are due to infections with virus and bacteria and are self-limited conditions lasting less than 14 days.  For your symptoms you may take Imodium 2 mg tablets that are over the counter at your local pharmacy. Take two tablet now and then one after each loose stool up to 6 a day.  Antibiotics are not needed for most people with diarrhea.  Optional: I have sent in zofran for vomiting  HOME CARE  We recommend changing your diet to help with your symptoms for the next few days.  Drink plenty of fluids that contain water salt and sugar. Sports drinks such as Gatorade may help.   You may try broths, soups, bananas, applesauce, soft breads, mashed potatoes or crackers.   You are considered infectious for as long as the diarrhea continues. Hand washing or use of alcohol based hand sanitizers is recommend.  It is best to stay out of work or school until your symptoms stop.   GET HELP RIGHT AWAY  If you have dark yellow colored urine or do not pass urine frequently you should drink more fluids.    If your symptoms worsen   If you feel like you are going to pass out (faint)  You have a new problem  MAKE SURE YOU   Understand these instructions.  Will watch your condition.  Will get help right away if you are not doing well or get worse.  Your e-visit answers were reviewed by a board certified advanced clinical practitioner to complete your personal care plan.  Depending on the condition, your plan could have included both over the counter or prescription medications.  If there is a problem please reply  once you have received a response from your provider.  Your safety is important to [place].  If you have drug allergies check your prescription carefully.    You can use MyChart to ask questions  about today's visit, request a non-urgent call back, or ask for a work or school excuse for 24 hours related to this e-Visit. If it has been greater than 24 hours you will need to follow up with your provider, or enter a new e-Visit to address those concerns.   You will get an e-mail in the next two days asking about your experience.  I hope that your e-visit has been valuable and will speed your recovery. Thank you for using e-visits.

## 2015-07-23 ENCOUNTER — Encounter (HOSPITAL_BASED_OUTPATIENT_CLINIC_OR_DEPARTMENT_OTHER): Payer: Self-pay

## 2015-07-23 ENCOUNTER — Emergency Department (HOSPITAL_BASED_OUTPATIENT_CLINIC_OR_DEPARTMENT_OTHER)
Admission: EM | Admit: 2015-07-23 | Discharge: 2015-07-23 | Disposition: A | Discharge status: Home / Self Care | Payer: 59 | Attending: Emergency Medicine | Admitting: Emergency Medicine

## 2015-07-23 DIAGNOSIS — R197 Diarrhea, unspecified: Secondary | ICD-10-CM | POA: Diagnosis not present

## 2015-07-23 DIAGNOSIS — Z79899 Other long term (current) drug therapy: Secondary | ICD-10-CM | POA: Diagnosis not present

## 2015-07-23 DIAGNOSIS — R1010 Upper abdominal pain, unspecified: Secondary | ICD-10-CM | POA: Insufficient documentation

## 2015-07-23 DIAGNOSIS — E039 Hypothyroidism, unspecified: Secondary | ICD-10-CM | POA: Diagnosis not present

## 2015-07-23 DIAGNOSIS — R112 Nausea with vomiting, unspecified: Secondary | ICD-10-CM | POA: Insufficient documentation

## 2015-07-23 DIAGNOSIS — K219 Gastro-esophageal reflux disease without esophagitis: Secondary | ICD-10-CM | POA: Diagnosis not present

## 2015-07-23 LAB — CBC WITH DIFFERENTIAL/PLATELET
BASOS ABS: 0 10*3/uL (ref 0.0–0.1)
BASOS PCT: 0 %
EOS PCT: 2 %
Eosinophils Absolute: 0.2 10*3/uL (ref 0.0–0.7)
HCT: 44.6 % (ref 36.0–46.0)
Hemoglobin: 14.8 g/dL (ref 12.0–15.0)
LYMPHS PCT: 10 %
Lymphs Abs: 1.2 10*3/uL (ref 0.7–4.0)
MCH: 28.7 pg (ref 26.0–34.0)
MCHC: 33.2 g/dL (ref 30.0–36.0)
MCV: 86.4 fL (ref 78.0–100.0)
Monocytes Absolute: 1.3 10*3/uL — ABNORMAL HIGH (ref 0.1–1.0)
Monocytes Relative: 11 %
Neutro Abs: 9.1 10*3/uL — ABNORMAL HIGH (ref 1.7–7.7)
Neutrophils Relative %: 77 %
PLATELETS: 187 10*3/uL (ref 150–400)
RBC: 5.16 MIL/uL — AB (ref 3.87–5.11)
RDW: 14.4 % (ref 11.5–15.5)
WBC: 11.9 10*3/uL — ABNORMAL HIGH (ref 4.0–10.5)

## 2015-07-23 LAB — COMPREHENSIVE METABOLIC PANEL
ALBUMIN: 3.4 g/dL — AB (ref 3.5–5.0)
ALT: 52 U/L (ref 14–54)
AST: 36 U/L (ref 15–41)
Alkaline Phosphatase: 52 U/L (ref 38–126)
Anion gap: 7 (ref 5–15)
BUN: 15 mg/dL (ref 6–20)
CHLORIDE: 112 mmol/L — AB (ref 101–111)
CO2: 20 mmol/L — AB (ref 22–32)
CREATININE: 1 mg/dL (ref 0.44–1.00)
Calcium: 7.8 mg/dL — ABNORMAL LOW (ref 8.9–10.3)
GFR calc Af Amer: >60 mL/min (ref >60)
GLUCOSE: 90 mg/dL (ref 65–99)
POTASSIUM: 3.9 mmol/L (ref 3.5–5.1)
SODIUM: 139 mmol/L (ref 135–145)
Total Bilirubin: 0.6 mg/dL (ref 0.3–1.2)
Total Protein: 6.2 g/dL — ABNORMAL LOW (ref 6.5–8.1)

## 2015-07-23 LAB — LIPASE, BLOOD: Lipase: 45 U/L (ref 11–51)

## 2015-07-23 LAB — I-STAT CG4 LACTIC ACID, ED: Lactic Acid, Venous: 1.27 mmol/L (ref 0.5–2.0)

## 2015-07-23 MED ORDER — PROMETHAZINE HCL 25 MG PO TABS: 25.0000 mg | ORAL_TABLET | Freq: Four times a day (QID) | ORAL | Status: DC | PRN

## 2015-07-23 MED ORDER — ONDANSETRON HCL 4 MG/2ML IJ SOLN
4.0000 mg | Freq: Once | INTRAMUSCULAR | Status: AC
  Administered 2015-07-23: 4 mg via INTRAVENOUS
  Filled 2015-07-23: qty 2

## 2015-07-23 MED ORDER — SODIUM CHLORIDE 0.9 % IV BOLUS (SEPSIS)
1000.0000 mL | Freq: Once | INTRAVENOUS | Status: AC
  Administered 2015-07-23: 1000 mL via INTRAVENOUS

## 2015-07-23 MED ORDER — PROMETHAZINE HCL 25 MG RE SUPP: 25.0000 mg | Freq: Four times a day (QID) | RECTAL | Status: DC | PRN

## 2015-07-23 MED ORDER — HYDROMORPHONE HCL 1 MG/ML IJ SOLN
0.5000 mg | Freq: Once | INTRAMUSCULAR | Status: AC
  Administered 2015-07-23: 0.5 mg via INTRAVENOUS
  Filled 2015-07-23: qty 1

## 2015-07-23 NOTE — Discharge Instructions (Signed)

## 2015-07-23 NOTE — ED Notes (Signed)
N/v/ diarrhea, and pain since Monday0E visit Tuesday-rx imodium, zofran-pt states she is no better-NAD

## 2015-07-23 NOTE — ED Provider Notes (Signed)
CSN: [id number]     Arrival date & time 07/23/15  1314 History   First MD Initiated Contact with Patient 07/23/15 1458     Chief Complaint  Patient presents with  . Emesis     (Consider location/radiation/quality/duration/timing/severity/associated sxs/prior Treatment) HPI Comments: Patient is a 35 year old female, she presents to the hospital having nausea vomiting and diarrhea for the last 3 days. She reports that her entire family was sick with a virus that gave him nausea vomiting and diarrhea, the rest of the family has improved however she has stayed symptomatic despite using Zofran and Imodium that she received by a telemedicine consultation. On Wednesday the patient felt the same but felt less nauseated, Thursday she was feeling a little better however last night she developed some sharp abdominal pain, she points to her mid upper abdomen, states that she has had ongoing liquid diarrhea which is nonbloody, she has had some ongoing symptoms today, denies history of pancreatitis and has had a history of a cholecystectomy in the past. No urinary symptoms in fact she states that she is so dehydrated that she has not been making much urine. 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 female presenting with vomiting. The history is provided by the patient.  Emesis   Past Medical History  Diagnosis Date  . Thyroid disease   . Hypothyroid   . Tachycardia   . Hypothyroid   . Cervical adenopathy     Pt denies blood dyscrasia  . Acid reflux   . GERD (gastroesophageal reflux disease)    Past Surgical History  Procedure Laterality Date  . Cholecystectomy    . Foot surgery    . Tonsillectomy    . Cervical lymph node biopsy  02/24/2011    reactive only, per Dr. Georganna Skeans  . Cesarean section N/A 08/16/2012    Procedure: CESAREAN SECTION;  Surgeon: Elveria Royals, MD;  Location: [facility];  Service: Obstetrics;  Laterality: N/A;   Family History   Problem Relation Age of Onset  . Heart disease Mother   . Hypertension Mother   . Breast cancer Maternal Grandmother    Social History  Substance Use Topics  . Smoking status: Never Smoker   . Smokeless tobacco: Never Used  . Alcohol Use: No   OB History    Gravida Para Term Preterm AB TAB SAB Ectopic Multiple Living   1 1 0 1 0 0 0 0 1 2      Review of Systems  Gastrointestinal: Positive for vomiting.  All other systems reviewed and are negative.     Allergies  Nexium  Home Medications   Prior to Admission medications   Medication Sig Start Date End Date Taking? Authorizing Provider  acetaminophen (TYLENOL) 325 MG tablet Take 650 mg by mouth daily as needed. For pain    Historical Provider, MD  ibuprofen (ADVIL,MOTRIN) 600 MG tablet Take 1 tablet (600 mg total) by mouth every 6 (six) hours as needed. 08/19/12   Artelia Laroche, CNM  levothyroxine (SYNTHROID, LEVOTHROID) 150 MCG tablet Take 1 tablet (150 mcg total) by mouth daily. 08/11/11   Laurey Morale, MD  metoprolol succinate (TOPROL-XL) 100 MG 24 hr tablet Take 1 tablet (100 mg total) by mouth daily. Take with or immediately following a meal. 08/11/11   Laurey Morale, MD  omeprazole (PRILOSEC) 40 MG capsule Take 1 capsule (40 mg total) by mouth daily. 08/11/11   Laurey Morale,  MD  ondansetron (ZOFRAN) 4 MG tablet Take 1 tablet (4 mg total) by mouth every 8 (eight) hours as needed for nausea or vomiting. 07/20/15   Mary-Margaret Hassell Done, FNP  promethazine (PHENERGAN) 25 MG suppository Place 1 suppository (25 mg total) rectally every 6 (six) hours as needed for nausea or vomiting. 07/23/15   Noemi Chapel, MD  promethazine (PHENERGAN) 25 MG tablet Take 1 tablet (25 mg total) by mouth every 6 (six) hours as needed for nausea or vomiting. 07/23/15   Noemi Chapel, MD   BP 105/81 mmHg  Pulse 108  Temp(Src) 98.9 F (37.2 C) (Oral)  Resp 18  Ht 5\' 11"  (1.803 m)  Wt 250 lb (113.399 kg)  BMI 34.88 kg/m2  SpO2 99%  LMP  Physical Exam   Constitutional: She appears well-developed and well-nourished. No distress.  HENT:  Head: Normocephalic and atraumatic.  Mouth/Throat: Oropharynx is clear and moist. No oropharyngeal exudate.  Dry mucous membranes  Eyes: Conjunctivae and EOM are normal. Pupils are equal, round, and reactive to light. Right eye exhibits no discharge. Left eye exhibits no discharge. No scleral icterus.  Neck: Normal range of motion. Neck supple. No JVD present. No thyromegaly present.  Cardiovascular: Normal rate, regular rhythm, normal heart sounds and intact distal pulses.  Exam reveals no gallop and no friction rub.   No murmur heard. Pulmonary/Chest: Effort normal and breath sounds normal. No respiratory distress. She has no wheezes. She has no rales.  Abdominal: Soft. Bowel sounds are normal. She exhibits no distension and no mass. There is tenderness ( Minimal upper abdominal tenderness).  Musculoskeletal: Normal range of motion. She exhibits no edema or tenderness.  Lymphadenopathy:    She has no cervical adenopathy.  Neurological: She is alert. Coordination normal.  Skin: Skin is warm and dry. No rash noted. No erythema.  Psychiatric: She has a normal mood and affect. Her behavior is normal.  Nursing note and vitals reviewed.   ED Course  Procedures (including critical care time) Labs Review Labs Reviewed  CBC WITH DIFFERENTIAL/PLATELET - Abnormal; Notable for the following:    WBC 11.9 (*)    RBC 5.16 (*)    Neutro Abs 9.1 (*)    Monocytes Absolute 1.3 (*)    All other components within normal limits  COMPREHENSIVE METABOLIC PANEL - Abnormal; Notable for the following:    Chloride 112 (*)    CO2 20 (*)    Calcium 7.8 (*)    Total Protein 6.2 (*)    Albumin 3.4 (*)    All other components within normal limits  LIPASE, BLOOD  I-STAT CG4 LACTIC ACID, ED    Imaging Review No results found. I have personally reviewed and evaluated these images and lab results as part of my medical  decision-making.    MDM   Final diagnoses:  Nausea vomiting and diarrhea    The patient has a borderline tachycardia but normal blood pressure, no fever, 3-4 days of ongoing nausea vomiting and watery diarrhea concerning for neurovirus or other infectious colitis. At this point the patient would benefit from intravenous fluids, antiemetics, check labs to rule out other sources such as acute hepatitis, acute pancreatitis, less likely to be related to a bowel obstruction given her ongoing watery diarrhea.  Pt has improved signficantly - fluids and meds given, tolerated PO.  Meds given in ED:  Medications  sodium chloride 0.9 % bolus 1,000 mL (0 mLs Intravenous Stopped 07/23/15 1627)  sodium chloride 0.9 % bolus 1,000 mL (1,000  mLs Intravenous New Bag/Given 07/23/15 1626)  ondansetron (ZOFRAN) injection 4 mg (4 mg Intravenous Given 07/23/15 1524)  HYDROmorphone (DILAUDID) injection 0.5 mg (0.5 mg Intravenous Given 07/23/15 1626)    New Prescriptions   PROMETHAZINE (PHENERGAN) 25 MG SUPPOSITORY    Place 1 suppository (25 mg total) rectally every 6 (six) hours as needed for nausea or vomiting.   PROMETHAZINE (PHENERGAN) 25 MG TABLET    Take 1 tablet (25 mg total) by mouth every 6 (six) hours as needed for nausea or vomiting.        Noemi Chapel, MD 07/23/15 [phone]

## 2015-07-27 ENCOUNTER — Emergency Department (HOSPITAL_COMMUNITY): Payer: 59

## 2015-07-27 ENCOUNTER — Encounter (HOSPITAL_COMMUNITY): Payer: Self-pay | Admitting: Emergency Medicine

## 2015-07-27 ENCOUNTER — Emergency Department (HOSPITAL_COMMUNITY)
Admission: EM | Admit: 2015-07-27 | Discharge: 2015-07-27 | Disposition: A | Discharge status: Home / Self Care | Payer: 59 | Attending: Emergency Medicine | Admitting: Emergency Medicine

## 2015-07-27 DIAGNOSIS — K219 Gastro-esophageal reflux disease without esophagitis: Secondary | ICD-10-CM | POA: Diagnosis not present

## 2015-07-27 DIAGNOSIS — K760 Fatty (change of) liver, not elsewhere classified: Secondary | ICD-10-CM | POA: Diagnosis not present

## 2015-07-27 DIAGNOSIS — E039 Hypothyroidism, unspecified: Secondary | ICD-10-CM | POA: Insufficient documentation

## 2015-07-27 DIAGNOSIS — R1031 Right lower quadrant pain: Secondary | ICD-10-CM | POA: Diagnosis not present

## 2015-07-27 DIAGNOSIS — R1084 Generalized abdominal pain: Secondary | ICD-10-CM | POA: Diagnosis present

## 2015-07-27 DIAGNOSIS — R748 Abnormal levels of other serum enzymes: Secondary | ICD-10-CM | POA: Diagnosis not present

## 2015-07-27 DIAGNOSIS — Z3202 Encounter for pregnancy test, result negative: Secondary | ICD-10-CM | POA: Diagnosis not present

## 2015-07-27 DIAGNOSIS — Z79899 Other long term (current) drug therapy: Secondary | ICD-10-CM | POA: Diagnosis not present

## 2015-07-27 LAB — COMPREHENSIVE METABOLIC PANEL
ALBUMIN: 3.3 g/dL — AB (ref 3.5–5.0)
ALK PHOS: 52 U/L (ref 38–126)
ALT: 56 U/L — AB (ref 14–54)
ANION GAP: 9 (ref 5–15)
AST: 39 U/L (ref 15–41)
BUN: 6 mg/dL (ref 6–20)
CHLORIDE: 112 mmol/L — AB (ref 101–111)
CO2: 23 mmol/L (ref 22–32)
CREATININE: 0.96 mg/dL (ref 0.44–1.00)
Calcium: 8.4 mg/dL — ABNORMAL LOW (ref 8.9–10.3)
GFR calc non Af Amer: >60 mL/min (ref >60)
GLUCOSE: 136 mg/dL — AB (ref 65–99)
Potassium: 3.3 mmol/L — ABNORMAL LOW (ref 3.5–5.1)
SODIUM: 144 mmol/L (ref 135–145)
Total Bilirubin: 0.5 mg/dL (ref 0.3–1.2)
Total Protein: 6.1 g/dL — ABNORMAL LOW (ref 6.5–8.1)

## 2015-07-27 LAB — URINE MICROSCOPIC-ADD ON

## 2015-07-27 LAB — CBC
HCT: 42.6 % (ref 36.0–46.0)
HEMOGLOBIN: 14.7 g/dL (ref 12.0–15.0)
MCH: 29.4 pg (ref 26.0–34.0)
MCHC: 34.5 g/dL (ref 30.0–36.0)
MCV: 85.2 fL (ref 78.0–100.0)
Platelets: 264 10*3/uL (ref 150–400)
RBC: 5 MIL/uL (ref 3.87–5.11)
RDW: 13.9 % (ref 11.5–15.5)
WBC: 6.2 10*3/uL (ref 4.0–10.5)

## 2015-07-27 LAB — URINALYSIS, ROUTINE W REFLEX MICROSCOPIC
BILIRUBIN URINE: NEGATIVE
GLUCOSE, UA: NEGATIVE mg/dL
HGB URINE DIPSTICK: NEGATIVE
Ketones, ur: NEGATIVE mg/dL
Leukocytes, UA: NEGATIVE
Nitrite: NEGATIVE
Protein, ur: NEGATIVE mg/dL
SPECIFIC GRAVITY, URINE: 1.025 (ref 1.005–1.030)
pH: 5.5 (ref 5.0–8.0)

## 2015-07-27 LAB — POC URINE PREG, ED: PREG TEST UR: NEGATIVE

## 2015-07-27 LAB — LIPASE, BLOOD: LIPASE: 127 U/L — AB (ref 11–51)

## 2015-07-27 MED ORDER — SODIUM CHLORIDE 0.9 % IV BOLUS (SEPSIS)
1000.0000 mL | Freq: Once | INTRAVENOUS | Status: AC
  Administered 2015-07-27: 1000 mL via INTRAVENOUS

## 2015-07-27 MED ORDER — MORPHINE SULFATE (PF) 4 MG/ML IV SOLN
4.0000 mg | Freq: Once | INTRAVENOUS | Status: AC
  Administered 2015-07-27: 4 mg via INTRAVENOUS
  Filled 2015-07-27: qty 1

## 2015-07-27 MED ORDER — OXYCODONE-ACETAMINOPHEN 5-325 MG PO TABS: 1.0000 | ORAL_TABLET | Freq: Four times a day (QID) | ORAL | Status: DC | PRN

## 2015-07-27 MED ORDER — ONDANSETRON 4 MG PO TBDP
ORAL_TABLET | ORAL | Status: AC
  Filled 2015-07-27: qty 1

## 2015-07-27 MED ORDER — ONDANSETRON 4 MG PO TBDP
4.0000 mg | ORAL_TABLET | Freq: Once | ORAL | Status: AC | PRN
  Administered 2015-07-27: 4 mg via ORAL

## 2015-07-27 MED ORDER — IOHEXOL 300 MG/ML  SOLN
100.0000 mL | Freq: Once | INTRAMUSCULAR | Status: AC | PRN
  Administered 2015-07-27: 100 mL via INTRAVENOUS

## 2015-07-27 NOTE — Discharge Instructions (Signed)
Your CT was abnormal with the exception of fatty liver.  Please return to ED if you have pain that radiates to your back, pain intensifies, you are unable to tolerate any liquids by mouth, or you start vomiting again.  Please make follow-up appointment with PCP.  Abdominal Pain, Adult Many things can cause abdominal pain. Usually, abdominal pain is not caused by a disease and will improve without treatment. It can often be observed and treated at home. Your health care provider will do a physical exam and possibly order blood tests and X-rays to help determine the seriousness of your pain. However, in many cases, more time must pass before a clear cause of the pain can be found. Before that point, your health care provider may not know if you need more testing or further treatment. HOME CARE INSTRUCTIONS Monitor your abdominal pain for any changes. The following actions may help to alleviate any discomfort you are experiencing:  Only take over-the-counter or prescription medicines as directed by your health care provider.  Do not take laxatives unless directed to do so by your health care provider.  Try a clear liquid diet (broth, tea, or water) as directed by your health care provider. Slowly move to a bland diet as tolerated. SEEK MEDICAL CARE IF:  You have unexplained abdominal pain.  You have abdominal pain associated with nausea or diarrhea.  You have pain when you urinate or have a bowel movement.  You experience abdominal pain that wakes you in the night.  You have abdominal pain that is worsened or improved by eating food.  You have abdominal pain that is worsened with eating fatty foods.  You have a fever. SEEK IMMEDIATE MEDICAL CARE IF:  Your pain does not go away within 2 hours.  You keep throwing up (vomiting).  Your pain is felt only in portions of the abdomen, such as the right side or the left lower portion of the abdomen.  You pass bloody or black tarry  stools. MAKE SURE YOU:  Understand these instructions.  Will watch your condition.  Will get help right away if you are not doing well or get worse.   This information is not intended to replace advice given to you by your health care provider. Make sure you discuss any questions you have with your health care provider.   Document Released: 03/29/2005 Document Revised: 03/10/2015 Document Reviewed: 02/26/2013 Elsevier Interactive Patient Education [employer].

## 2015-07-27 NOTE — ED Provider Notes (Signed)
CSN: [id number]     Arrival date & time 07/27/15  0707 History   First MD Initiated Contact with Patient 07/27/15 1030     Chief Complaint  Patient presents with  . 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 with PMH of GERD, hypothyroidism presenting to ED for evaluation of abd pain.  Patient reports liekly viral gastroenteritis with N/V/D last week.  She was seen in ED on 1/20 for ongoing N/V/D and generalized abd pain (notes state epigastric abd pain at that time).  She was given IVF and phenergan and N/V/D resolved.  She continued to have mild abd pain throughout the weekend, but awoke this AM around 5am with severe RLQ pain.  She denies any further emesis or diarrhea, constipation, fevers, chills, back pain, chest pain.  She reports this pain is similar to previous pain from ovarian cyst.  Pain is worse with movement.  Patient is a 35 y.o. female presenting with abdominal pain. The history is provided by the patient.  Abdominal Pain Pain location:  RLQ Pain quality: aching   Pain radiates to:  Does not radiate Pain severity:  Severe Onset quality:  Sudden Duration:  6 hours Timing:  Constant Progression:  Unchanged Chronicity:  New Context: not alcohol use   Relieved by:  None tried Worsened by:  Movement and position changes Ineffective treatments:  None tried Associated symptoms: no chills, no constipation, no diarrhea, no dysuria, no fever, no hematemesis, no hematochezia, no hematuria, no nausea and no vomiting     Past Medical History  Diagnosis Date  . Thyroid disease   . Hypothyroid   . Tachycardia   . Hypothyroid   . Cervical adenopathy     Pt denies blood dyscrasia  . Acid reflux   . GERD (gastroesophageal reflux disease)    Past Surgical History  Procedure Laterality Date  . Cholecystectomy    . Foot surgery    .  Tonsillectomy    . Cervical lymph node biopsy  02/24/2011    reactive only, per Dr. Georganna Skeans  . Cesarean section N/A 08/16/2012    Procedure: CESAREAN SECTION;  Surgeon: Elveria Royals, MD;  Location: [facility];  Service: Obstetrics;  Laterality: N/A;   Family History  Problem Relation Age of Onset  . Heart disease Mother   . Hypertension Mother   . Breast cancer Maternal Grandmother    Social History  Substance Use Topics  . Smoking status: Never Smoker   . Smokeless tobacco: Never Used  . Alcohol Use: No   OB History    Gravida Para Term Preterm AB TAB SAB Ectopic Multiple Living   1 1 0 1 0 0 0 0 1 2      Review of Systems  Constitutional: Negative for fever and chills.  Gastrointestinal: Positive for abdominal pain. Negative for nausea, vomiting, diarrhea, constipation, hematochezia and hematemesis.  Genitourinary: Negative for dysuria and hematuria.  All other systems reviewed and are negative.     Allergies  Nexium  Home Medications   Prior to Admission medications   Medication Sig Start Date End Date Taking? Authorizing Provider  levothyroxine (SYNTHROID, LEVOTHROID) 112 MCG tablet Take 112 mcg by mouth daily. 05/10/15  Yes Historical Provider, MD  metoprolol succinate (TOPROL-XL) 100 MG 24 hr tablet Take 1 tablet (100 mg total) by mouth daily. Take with or immediately following a meal. 08/11/11  Yes Annie Main  Raymon Mutton, MD  omeprazole (PRILOSEC) 40 MG capsule Take 1 capsule (40 mg total) by mouth daily. 08/11/11  Yes Laurey Morale, MD  promethazine (PHENERGAN) 25 MG tablet Take 1 tablet (25 mg total) by mouth every 6 (six) hours as needed for nausea or vomiting. 07/23/15  Yes Noemi Chapel, MD  sertraline (ZOLOFT) 25 MG tablet Take 25 mg by mouth daily. 06/29/15  Yes Historical Provider, MD  ibuprofen (ADVIL,MOTRIN) 600 MG tablet Take 1 tablet (600 mg total) by mouth every 6 (six) hours as needed. Patient not taking: Reported on 07/27/2015 08/19/12   Artelia Laroche, CNM   levothyroxine (SYNTHROID, LEVOTHROID) 150 MCG tablet Take 1 tablet (150 mcg total) by mouth daily. Patient not taking: Reported on 07/27/2015 08/11/11   Laurey Morale, MD  ondansetron (ZOFRAN) 4 MG tablet Take 1 tablet (4 mg total) by mouth every 8 (eight) hours as needed for nausea or vomiting. Patient not taking: Reported on 07/27/2015 07/20/15   Mary-Margaret Hassell Done, FNP  oxyCODONE-acetaminophen (PERCOCET/ROXICET) 5-325 MG tablet Take 1-2 tablets by mouth every 6 (six) hours as needed for severe pain. 07/27/15   Virginia Crews, MD  promethazine (PHENERGAN) 25 MG suppository Place 1 suppository (25 mg total) rectally every 6 (six) hours as needed for nausea or vomiting. Patient not taking: Reported on 07/27/2015 07/23/15   Noemi Chapel, MD   BP 107/71 mmHg  Pulse 75  Temp(Src) 98 F (36.7 C) (Oral)  Resp 16  Ht 5\' 11"  (1.803 m)  Wt 113.399 kg  BMI 34.88 kg/m2  SpO2 97% Physical Exam  Constitutional: She is oriented to person, place, and time. She appears well-developed and well-nourished. No distress.  HENT:  Head: Normocephalic and atraumatic.  Mouth/Throat: Oropharynx is clear and moist. No oropharyngeal exudate.  Eyes: Conjunctivae and EOM are normal. Pupils are equal, round, and reactive to light.  Neck: Neck supple.  Cardiovascular: Normal rate, regular rhythm, normal heart sounds and intact distal pulses.   No murmur heard. Pulmonary/Chest: Effort normal and breath sounds normal. No respiratory distress. She has no wheezes. She has no rales.  Abdominal: Soft. Bowel sounds are normal. She exhibits no distension. There is no rebound and no guarding.  TTP in RLQ  Musculoskeletal: She exhibits no edema or tenderness.  Neurological: She is alert and oriented to person, place, and time. No cranial nerve deficit.  Skin: Skin is warm and dry. No rash noted.  Psychiatric: She has a normal mood and affect. Her behavior is normal.    ED Course  Procedures (including critical care  time) Labs Review Labs Reviewed  LIPASE, BLOOD - Abnormal; Notable for the following:    Lipase 127 (*)    All other components within normal limits  COMPREHENSIVE METABOLIC PANEL - Abnormal; Notable for the following:    Potassium 3.3 (*)    Chloride 112 (*)    Glucose, Bld 136 (*)    Calcium 8.4 (*)    Total Protein 6.1 (*)    Albumin 3.3 (*)    ALT 56 (*)    All other components within normal limits  URINALYSIS, ROUTINE W REFLEX MICROSCOPIC (NOT AT [facility]) - Abnormal; Notable for the following:    APPearance TURBID (*)    All other components within normal limits  URINE MICROSCOPIC-ADD ON - Abnormal; Notable for the following:    Squamous Epithelial / LPF 6-30 (*)    Bacteria, UA FEW (*)    Casts HYALINE CASTS (*)    All other components  within normal limits  CBC  POC URINE PREG, ED    Imaging Review Ct Abdomen Pelvis W Contrast  07/27/2015  CLINICAL DATA:  Right lower quadrant pain for the last week, nausea and vomiting EXAM: CT ABDOMEN AND PELVIS WITH CONTRAST TECHNIQUE: Multidetector CT imaging of the abdomen and pelvis was performed using the standard protocol following bolus administration of intravenous contrast. CONTRAST:  12mL OMNIPAQUE IOHEXOL 300 MG/ML  SOLN COMPARISON:  11/30/2006 FINDINGS: Lung bases are unremarkable. Sagittal images of the spine shows mild degenerative changes lower thoracic spine. Stable mild compression deformity upper endplate of L1 vertebral body. There is significant fatty infiltration of the liver. Postcholecystectomy surgical clips are noted. Small hiatal hernia is noted. No focal hepatic mass. The pancreas, spleen and adrenal glands are unremarkable. Kidneys are symmetrical in size and enhancement. No aortic aneurysm. Small umbilical hernia containing fat. No evidence of acute complication. The the the Some stool noted in right colon and cecum. No pericecal inflammation. Terminal ileum is unremarkable. Normal retrocecal appendix clearly visualized  in axial image 57. No small bowel obstruction.  No ascites or free air. The urinary bladder is empty limiting its assessment. The uterus is anteflexed. Ovaries are unremarkable. No adnexal masses noted. No inguinal adenopathy. No destructive bony lesions are noted within pelvis. Mild degenerative changes SI joints. IMPRESSION: 1. There is no evidence of acute inflammatory process within abdomen or pelvis. 2. Significant fatty infiltration of the liver. 3. No small bowel obstruction. 4. Normal appendix.  No pericecal inflammation. 5. No colonic obstruction. 6. Unremarkable uterus and ovaries. Electronically Signed   By: Lahoma Crocker M.D.   On: 07/27/2015 12:22   I have personally reviewed and evaluated these images and lab results as part of my medical decision-making.   EKG Interpretation None      MDM   Final diagnoses:  Abdominal pain, RLQ  Fatty liver  Elevated lipase    35 y.o. F presenting with RLQ abd pain.  Afebrile, normal WBC.  CT abd/pelvis shows no acute abnormalities with normal appendix, uterus, ovaries, and fatty liver.  Lipase elevated to 127, up from 45 a few days ago.  No signs of pancreatitis on exam and no further vomiting.  Also with normal pancreas on CT.  No explanation for RLQ pain on CT, but patient nontoxic, and no evidence of appendicitis and normal ovaries on CT.  Will bolus patient with 1L NS as UA appears dehydrated.  Patient safe for discharge home with percocet for pain control and strict return precautions.     Virginia Crews, MD 07/27/15 1316  Leonard Schwartz, MD 07/27/15 [phone]

## 2015-07-27 NOTE — ED Notes (Signed)
Patient transported to CT 

## 2015-07-27 NOTE — ED Notes (Signed)
Reports 1 week of N/V/D with some improvement but worsening abd pain, no other complaints, A/O, ambulatory and in NAD

## 2015-07-30 ENCOUNTER — Encounter: Payer: Self-pay | Admitting: Family Medicine

## 2015-07-30 ENCOUNTER — Ambulatory Visit (INDEPENDENT_AMBULATORY_CARE_PROVIDER_SITE_OTHER): Payer: 59 | Admitting: Family Medicine

## 2015-07-30 ENCOUNTER — Telehealth: Payer: Self-pay | Admitting: Family Medicine

## 2015-07-30 VITALS — BP 104/79 | HR 70 | Temp 98.3°F | Ht 71.0 in | Wt 277.0 lb

## 2015-07-30 DIAGNOSIS — R1013 Epigastric pain: Secondary | ICD-10-CM | POA: Diagnosis not present

## 2015-07-30 MED ORDER — MORPHINE SULFATE 15 MG PO TABS: 15.0000 mg | ORAL_TABLET | ORAL | Status: DC | PRN

## 2015-07-30 NOTE — Progress Notes (Signed)
Pre visit review using our clinic review tool, if applicable. No additional management support is needed unless otherwise documented below in the visit note. 

## 2015-07-30 NOTE — Telephone Encounter (Signed)
I understand. Try the morphine over the weekend and let me know on Monday how she is doing

## 2015-07-30 NOTE — Progress Notes (Signed)
   Subjective:    Patient ID: Jill Baker, female    DOB: 10/04/1980, 35 y.o.   MRN: [id number]  HPI Here to follow up two ER visits, one on 07-23-15 and another on 07-27-15, for abdominal pain and nausea. At the first visit she had symptoms on nausea, vomiting, and diarrhea which were felt to be from a viral enteritis. Her two young children also had vomiting and diarrhea for a few days at that time. Jill Baker had normal labs except for a slightly elevated WBC at 11.9 and a normal lipase at 45. She was given IV fluids and Zofran. After she went home the diarrhea improved but the nausea and vomiting continued. She never had a fever with all this apparently. Then she developed generalized upper abdominal pain and a severe RLQ pain so se returned to the ER. Her labs the send time showed the WBC was back down to 6.2 but the lipase had risen to 127. A CT scan was totally unremarkable showing a normal pancreas and a normal appendix. Her gall bladder is surgically absent. She was given more fluids and some hydrocodone and sent home. Since then the diarrhea has inmproved and she has not vomited, but she still gets nauseated. The RLQ pain as resolved but she still has constant "squeezing" upper abdominal pains. Her appetite is down and eating almost anything makes the pain worse. She is drinking plenty of fluids.    Review of Systems  Constitutional: Positive for appetite change. Negative for fever, chills and diaphoresis.  Respiratory: Negative.   Cardiovascular: Negative.   Gastrointestinal: Positive for nausea and abdominal pain. Negative for vomiting, diarrhea, constipation, blood in stool, abdominal distention, anal bleeding and rectal pain.  Genitourinary: Negative.        Objective:   Physical Exam  Constitutional: She appears well-developed and well-nourished. No distress.  Cardiovascular: Normal rate, regular rhythm, normal heart sounds and intact distal pulses.   Pulmonary/Chest: Effort normal  and breath sounds normal.  Abdominal: Soft. Bowel sounds are normal. She exhibits no distension and no mass. There is no rebound and no guarding.  Moderately tender in the RUQ, epigastrium, LUQ, and left flank.           Assessment & Plan:  She is having nausea and upper abdominal pain. These symptoms could be the result of a blocked common bile duct, and it seems that a viral enteritis may have brought this to light. She will continue to drink water and Gatorade. Try morphine sulfate to relieve the abdominal pain. Use Zofran prn. We will refer her to GI to evaluate. She may be a candidate for upper endoscopy or maybe ERCP. Written out of work from 07-27-15 until  08-10-15.

## 2015-07-30 NOTE — Telephone Encounter (Signed)
I left a voice message with below information. 

## 2015-07-30 NOTE — Telephone Encounter (Signed)
Pt said she could not get in till 08/13/15 top see the GI doctor. Pt said Dr Sarajane Jews told her to call back if she could not get in next week

## 2015-08-02 ENCOUNTER — Ambulatory Visit: Payer: 59 | Admitting: Family Medicine

## 2015-08-03 ENCOUNTER — Ambulatory Visit: Payer: Self-pay | Admitting: Family Medicine

## 2015-08-05 ENCOUNTER — Telehealth: Payer: Self-pay | Admitting: Family Medicine

## 2015-08-05 NOTE — Telephone Encounter (Signed)
Pt call to say her FMLA paperwork beginning date should be 07/21/15 and the  End date 08/16/15    She they will refax the fmla paperwork back over today

## 2015-08-06 ENCOUNTER — Encounter: Payer: Self-pay | Admitting: Family Medicine

## 2015-08-06 NOTE — Telephone Encounter (Signed)
I changed the paperwork to reflect these dates

## 2015-08-06 NOTE — Telephone Encounter (Signed)
I re faxed paperwork both [place].

## 2015-08-06 NOTE — Telephone Encounter (Signed)
We took care of this?

## 2015-08-12 ENCOUNTER — Encounter: Payer: Self-pay | Admitting: Family Medicine

## 2015-08-12 NOTE — Telephone Encounter (Signed)
This is a duplicate note, see previous note.  

## 2015-08-13 ENCOUNTER — Encounter: Payer: Self-pay | Admitting: Internal Medicine

## 2015-08-13 ENCOUNTER — Ambulatory Visit (INDEPENDENT_AMBULATORY_CARE_PROVIDER_SITE_OTHER): Payer: 59 | Admitting: Internal Medicine

## 2015-08-13 VITALS — BP 104/78 | HR 84 | Ht 69.5 in | Wt 270.2 lb

## 2015-08-13 DIAGNOSIS — K760 Fatty (change of) liver, not elsewhere classified: Secondary | ICD-10-CM

## 2015-08-13 DIAGNOSIS — R1084 Generalized abdominal pain: Secondary | ICD-10-CM | POA: Diagnosis not present

## 2015-08-13 DIAGNOSIS — R110 Nausea: Secondary | ICD-10-CM

## 2015-08-13 DIAGNOSIS — R748 Abnormal levels of other serum enzymes: Secondary | ICD-10-CM | POA: Diagnosis not present

## 2015-08-13 DIAGNOSIS — R7989 Other specified abnormal findings of blood chemistry: Secondary | ICD-10-CM

## 2015-08-13 DIAGNOSIS — R945 Abnormal results of liver function studies: Secondary | ICD-10-CM

## 2015-08-13 MED ORDER — ONDANSETRON HCL 4 MG PO TABS: 4.0000 mg | ORAL_TABLET | ORAL | Status: DC | PRN

## 2015-08-13 MED ORDER — DICYCLOMINE HCL 20 MG PO TABS: 20.0000 mg | ORAL_TABLET | Freq: Four times a day (QID) | ORAL | Status: DC | PRN

## 2015-08-13 NOTE — Patient Instructions (Signed)
We have sent the following medications to your pharmacy for you to pick up at your convenience:  Zofran, Bentyl

## 2015-08-13 NOTE — Progress Notes (Signed)
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 Alysia Penna with chief complaint of abdominal pain, change in bowel habits, and abnormal blood work. She is accompanied by her mother and twin children. Outside laboratories, x-rays, and prior endoscopy reports reviewed I saw the patient in November 2010 for nausea, vomiting, bloating, abdominal cramping, diarrhea. Testing for celiac sprue was negative. Colonoscopy revealed diverticulosis and a diminutive hyperplastic polyp. The ileum was normal. Random colon biopsies were normal. Upper endoscopy revealed benign distal esophageal stricture with small hiatal hernia. She was last seen in follow-up January 2011. Liver tests at that time were normal. B12 was low. Body weight was not documented. The patient states that she was in her usual state of good health until 07/20/2015 when she developed abruptly nausea with vomiting and diarrhea. In addition abdominal cramps. Over the next 2 weeks she was evaluated on 2 separate occasions with pharynx is of ongoing symptoms. Significant abdominal pain. Both right upper quadrant soreness and lower abdominal cramping. Workup included CT scan of the abdomen and pelvis with contrast 07/27/2015. She was noted to have fatty infiltration of the liver. No other abnormalities. Normal biliary region post cholecystectomy. Normal pancreas. Blood work revealed mild elevation of ALT at 56. There was also mild elevation of ALT prior to becoming ill. Serum lipase also mildly elevated at 127. She saw her primary care physician who wondered about choledocholithiasis given the blood test abnormalities as outlined. Also, given oxycodone and morphine for her pain. She has not been back at work since becoming ill. Her bowel habits have improved, but not at baseline. She continues with varying degrees of abdominal pain. There is significant nausea times but no vomiting. No other issues. She does have  history of GERD, but no symptoms on PPI  REVIEW OF SYSTEMS:  All non-GI ROS negative except for fatigue  Past Medical History  Diagnosis Date  . Thyroid disease   . Hypothyroid   . Tachycardia   . Hypothyroid   . Cervical adenopathy     Pt denies blood dyscrasia  . Acid reflux   . GERD (gastroesophageal reflux disease)   . Esophageal stricture   . Hiatal hernia   . Colon polyps     hyperplastic  . Diverticulosis     Past Surgical History  Procedure Laterality Date  . Cholecystectomy    . Foot surgery    . Tonsillectomy    . Cervical lymph node biopsy  02/24/2011    reactive only, per Dr. Georganna Skeans  . Cesarean section N/A 08/16/2012    Procedure: CESAREAN SECTION;  Surgeon: Elveria Royals, MD;  Location: [facility];  Service: Obstetrics;  Laterality: N/A;    Social History Jill Baker  reports that she has never smoked. She has never used smokeless tobacco. She reports that she does not drink alcohol or use illicit drugs.  family history includes Breast cancer in her maternal grandmother; Heart disease in her mother; Hypertension in her mother.  Allergies  Allergen Reactions  . Nexium [Esomeprazole Magnesium] Rash    NEXIUM BRAND NAME ONLY       PHYSICAL EXAMINATION: Vital signs: BP 104/78 mmHg  Pulse 84  Ht 5' 9.5" (1.765 m)  Wt 270 lb 4 oz (122.585 kg)  BMI 39.35 kg/m2  Constitutional: Obese but generally well-appearing, no acute distress Psychiatric: alert and oriented x3, cooperative Eyes: extraocular movements intact, anicteric, conjunctiva pink Mouth: oral pharynx moist,  no lesions Neck: supple without thyromegaly Lymph: no cervical lymphadenopathy Cardiovascular: heart regular rate and rhythm, no murmur Lungs: clear to auscultation bilaterally Abdomen: soft, obese, nontender, nondistended, no obvious ascites, no peritoneal signs, normal bowel sounds, no organomegaly Rectal: Omitted Extremities: no clubbing cyanosis or lower extremity edema  bilaterally Skin: no lesions on visible extremities Neuro: No focal deficits.   ASSESSMENT:  #1. Acute illness manifested by nausea vomiting diarrhea and abdominal cramping. Severe. Most consistent with norovirus. Overall improved but varying degrees of ongoing nausea and abdominal discomfort. As well, bowel habits not yet baseline #2. Mild elevation of ALT prior to and after acute illness. Most certainly secondary to fatty liver #3. Fatty liver #4. Mild elevation of serum lipase. Nonspecific. Can be seen with any acute intra-abdominal process including acute infectious gastroenteritis. #5. Status post cholecystectomy. Do not suspect choledocholithiasis #6. Morbid obesity   PLAN:   #1. Ongoing expectant management and symptomatic care   #2. Advised to avoid narcotics as this may disrupt GI motility   #3. Prescribe Zofran 4 mg every 4 hours as needed for nausea  #4. Prescribed Bentyl 20 mg every 6 hours as needed for pain #5. Exercise and weight loss for fatty liver #6. Contact this office should symptoms persist past 4 weeks or interval significant decline in her condition   A copy of this consultation note has been sent to Dr. Sarajane Jews

## 2015-08-23 MED FILL — LEVOTHYROXINE 112 MCG TAB: 112 | 90 days supply | Qty: 90 | Fill #1

## 2015-08-23 MED FILL — OMEPRAZOLE DR 40 MG CAPSULE: 40 | 90 days supply | Qty: 90 | Fill #2

## 2015-08-23 MED FILL — METOPROLOL SUCC ER 100 MG T: 100 | 90 days supply | Qty: 90 | Fill #3

## 2015-08-25 ENCOUNTER — Ambulatory Visit: Payer: 59 | Admitting: Physician Assistant

## 2015-09-29 MED FILL — SERTRALINE HCL 25 MG TABLET: 25 | 90 days supply | Qty: 90 | Fill #2

## 2015-10-15 ENCOUNTER — Encounter (HOSPITAL_BASED_OUTPATIENT_CLINIC_OR_DEPARTMENT_OTHER): Payer: Self-pay

## 2015-10-15 ENCOUNTER — Emergency Department (HOSPITAL_BASED_OUTPATIENT_CLINIC_OR_DEPARTMENT_OTHER)
Admission: EM | Admit: 2015-10-15 | Discharge: 2015-10-15 | Disposition: A | Discharge status: Home / Self Care | Payer: 59 | Attending: Emergency Medicine | Admitting: Emergency Medicine

## 2015-10-15 DIAGNOSIS — E039 Hypothyroidism, unspecified: Secondary | ICD-10-CM | POA: Insufficient documentation

## 2015-10-15 DIAGNOSIS — E079 Disorder of thyroid, unspecified: Secondary | ICD-10-CM | POA: Diagnosis not present

## 2015-10-15 DIAGNOSIS — Z86010 Personal history of colonic polyps: Secondary | ICD-10-CM | POA: Insufficient documentation

## 2015-10-15 DIAGNOSIS — K219 Gastro-esophageal reflux disease without esophagitis: Secondary | ICD-10-CM | POA: Insufficient documentation

## 2015-10-15 DIAGNOSIS — A084 Viral intestinal infection, unspecified: Secondary | ICD-10-CM | POA: Insufficient documentation

## 2015-10-15 DIAGNOSIS — R197 Diarrhea, unspecified: Secondary | ICD-10-CM | POA: Diagnosis present

## 2015-10-15 DIAGNOSIS — Z79899 Other long term (current) drug therapy: Secondary | ICD-10-CM | POA: Insufficient documentation

## 2015-10-15 LAB — COMPREHENSIVE METABOLIC PANEL
ALBUMIN: 3.9 g/dL (ref 3.5–5.0)
ALK PHOS: 61 U/L (ref 38–126)
ALT: 174 U/L — AB (ref 14–54)
ANION GAP: 9 (ref 5–15)
AST: 81 U/L — ABNORMAL HIGH (ref 15–41)
BUN: 17 mg/dL (ref 6–20)
CHLORIDE: 107 mmol/L (ref 101–111)
CO2: 19 mmol/L — AB (ref 22–32)
Calcium: 8.5 mg/dL — ABNORMAL LOW (ref 8.9–10.3)
Creatinine, Ser: 1.12 mg/dL — ABNORMAL HIGH (ref 0.44–1.00)
GFR calc Af Amer: >60 mL/min (ref >60)
GFR calc non Af Amer: >60 mL/min (ref >60)
GLUCOSE: 100 mg/dL — AB (ref 65–99)
POTASSIUM: 3.2 mmol/L — AB (ref 3.5–5.1)
SODIUM: 135 mmol/L (ref 135–145)
Total Bilirubin: 0.7 mg/dL (ref 0.3–1.2)
Total Protein: 7.7 g/dL (ref 6.5–8.1)

## 2015-10-15 LAB — CBC
HCT: 45.4 % (ref 36.0–46.0)
HEMOGLOBIN: 16 g/dL — AB (ref 12.0–15.0)
MCH: 29.2 pg (ref 26.0–34.0)
MCHC: 35.2 g/dL (ref 30.0–36.0)
MCV: 82.8 fL (ref 78.0–100.0)
Platelets: 207 10*3/uL (ref 150–400)
RBC: 5.48 MIL/uL — AB (ref 3.87–5.11)
RDW: 14.2 % (ref 11.5–15.5)
WBC: 10.8 10*3/uL — AB (ref 4.0–10.5)

## 2015-10-15 MED ORDER — SODIUM CHLORIDE 0.9 % IV BOLUS (SEPSIS)
1000.0000 mL | Freq: Once | INTRAVENOUS | Status: AC
  Administered 2015-10-15: 1000 mL via INTRAVENOUS

## 2015-10-15 MED ORDER — POTASSIUM CHLORIDE CRYS ER 20 MEQ PO TBCR
20.0000 meq | EXTENDED_RELEASE_TABLET | Freq: Once | ORAL | Status: AC
  Administered 2015-10-15: 20 meq via ORAL
  Filled 2015-10-15: qty 1

## 2015-10-15 MED ORDER — METOCLOPRAMIDE HCL 5 MG/ML IJ SOLN: 5.0000 mg | Freq: Once | INTRAMUSCULAR | Status: DC

## 2015-10-15 MED ORDER — METOCLOPRAMIDE HCL 5 MG/ML IJ SOLN
10.0000 mg | Freq: Once | INTRAMUSCULAR | Status: AC
  Administered 2015-10-15: 10 mg via INTRAVENOUS
  Filled 2015-10-15: qty 2

## 2015-10-15 NOTE — Discharge Instructions (Signed)
Please read and follow all provided instructions.  Your diagnoses today include:  1. Viral gastroenteritis    Tests performed today include:  Vital signs. See below for your results today.   Medications prescribed:   Take as prescribed   Home care instructions:  Follow any educational materials contained in this packet.  Follow-up instructions: Please follow-up with your primary care provider in the next week for further evaluation of symptoms and treatment   Return instructions:   Please return to the Emergency Department if you do not get better, if you get worse, or new symptoms OR  - Fever (temperature greater than 101.32F)  - Bleeding that does not stop with holding pressure to the area    -Severe pain (please note that you may be more sore the day after your accident)  - Chest Pain  - Difficulty breathing  - Severe nausea or vomiting  - Inability to tolerate food and liquids  - Passing out  - Skin becoming red around your wounds  - Change in mental status (confusion or lethargy)  - New numbness or weakness     Please return if you have any other emergent concerns.  Additional Information:  Your vital signs today were: BP 111/74 mmHg   Pulse 88   Temp(Src) 98.6 F (37 C) (Oral)   Resp 18   Ht 5\' 11"  (1.803 m)   Wt 113.399 kg   BMI 34.88 kg/m2   SpO2 98% If your blood pressure (BP) was elevated above 135/85 this visit, please have this repeated by your doctor within one month. ---------------

## 2015-10-15 NOTE — ED Provider Notes (Signed)
CSN: [id number]     Arrival date & time 10/15/15  1137 History   First MD Initiated Contact with Patient 10/15/15 1254     Chief Complaint  Patient presents with  . Diarrhea   (Consider location/radiation/quality/duration/timing/severity/associated sxs/prior Treatment) HPI  35 y.o. female presents to the Emergency Department today complaining of N/V/D since Sunday. States symptoms began after attending wedding over the weekend. Notes frequent episodes of diarrhea despite Lomotil, Imodium and Peptobismol. Does not endorse any recent ABX use. Notes generalized abdominal pain. States it is 5/10 and cramping. Noted fever 103.63F at home. Treated with Tylenol. No CP/SOB. No headaches. No vision changes. No recent surgeries. No other symptoms noted.    Past Medical History  Diagnosis Date  . Thyroid disease   . Hypothyroid   . Tachycardia   . Hypothyroid   . Cervical adenopathy     Pt denies blood dyscrasia  . Acid reflux   . GERD (gastroesophageal reflux disease)   . Esophageal stricture   . Hiatal hernia   . Colon polyps     hyperplastic  . Diverticulosis    Past Surgical History  Procedure Laterality Date  . Cholecystectomy    . Foot surgery    . Tonsillectomy    . Cervical lymph node biopsy  02/24/2011    reactive only, per Dr. Georganna Skeans  . Cesarean section N/A 08/16/2012    Procedure: CESAREAN SECTION;  Surgeon: Elveria Royals, MD;  Location: [facility];  Service: Obstetrics;  Laterality: N/A;   Family History  Problem Relation Age of Onset  . Heart disease Mother   . Hypertension Mother   . Breast cancer Maternal Grandmother    Social History  Substance Use Topics  . Smoking status: Never Smoker   . Smokeless tobacco: Never Used  . Alcohol Use: No   OB History    Gravida Para Term Preterm AB TAB SAB Ectopic Multiple Living   1 1 0 1 0 0 0 0 1 2      Review of Systems ROS reviewed and all are negative for acute change except as noted in the HPI.  Allergies   Nexium  Home Medications   Prior to Admission medications   Medication Sig Start Date End Date Taking? Authorizing Provider  dicyclomine (BENTYL) 20 MG tablet Take 1 tablet (20 mg total) by mouth every 6 (six) hours as needed (pain). 08/13/15   Irene Shipper, MD  ibuprofen (ADVIL,MOTRIN) 600 MG tablet Take 1 tablet (600 mg total) by mouth every 6 (six) hours as needed. 08/19/12   Artelia Laroche, CNM  levothyroxine (SYNTHROID, LEVOTHROID) 112 MCG tablet Take 112 mcg by mouth daily. 05/10/15   Historical Provider, MD  metoprolol succinate (TOPROL-XL) 100 MG 24 hr tablet Take 1 tablet (100 mg total) by mouth daily. Take with or immediately following a meal. 08/11/11   Laurey Morale, MD  morphine (MSIR) 15 MG tablet Take 1 tablet (15 mg total) by mouth every 4 (four) hours as needed for severe pain. 07/30/15   Laurey Morale, MD  omeprazole (PRILOSEC) 40 MG capsule Take 1 capsule (40 mg total) by mouth daily. 08/11/11   Laurey Morale, MD  ondansetron (ZOFRAN) 4 MG tablet Take 1 tablet (4 mg total) by mouth every 4 (four) hours as needed for nausea. 08/13/15   Irene Shipper, MD  oxyCODONE-acetaminophen (PERCOCET/ROXICET) 5-325 MG tablet Take 1-2 tablets by mouth every 6 (six) hours as needed for severe pain. 07/27/15   Levada Dy  Patrick Jupiter, MD  promethazine (PHENERGAN) 25 MG suppository Place 1 suppository (25 mg total) rectally every 6 (six) hours as needed for nausea or vomiting. 07/23/15   Noemi Chapel, MD  promethazine (PHENERGAN) 25 MG tablet Take 1 tablet (25 mg total) by mouth every 6 (six) hours as needed for nausea or vomiting. 07/23/15   Noemi Chapel, MD  sertraline (ZOLOFT) 25 MG tablet Take 25 mg by mouth daily. 06/29/15   Historical Provider, MD   BP 106/75 mmHg  Pulse 93  Temp(Src) 98.6 F (37 C) (Oral)  Resp 18  Ht 5\' 11"  (1.803 m)  Wt 113.399 kg  BMI 34.88 kg/m2  SpO2 97%   Physical Exam  Constitutional: She is oriented to person, place, and time. She appears well-developed and  well-nourished.  HENT:  Head: Normocephalic and atraumatic.  Eyes: EOM are normal. Pupils are equal, round, and reactive to light.  Neck: Normal range of motion. Neck supple. No tracheal deviation present.  Cardiovascular: Normal rate, regular rhythm, normal heart sounds and intact distal pulses.   No murmur heard. Pulmonary/Chest: Effort normal and breath sounds normal. No respiratory distress. She has no wheezes. She has no rales. She exhibits no tenderness.  Abdominal: Soft. Normal appearance and bowel sounds are normal. There is generalized tenderness. There is no rigidity, no rebound, no guarding, no CVA tenderness, no tenderness at McBurney's point and negative Murphy's sign.  Musculoskeletal: Normal range of motion.  Neurological: She is alert and oriented to person, place, and time.  Skin: Skin is warm and dry.  Psychiatric: She has a normal mood and affect. Her behavior is normal. Thought content normal.  Nursing note and vitals reviewed.  ED Course  Procedures (including critical care time) Labs Review Labs Reviewed  CBC - Abnormal; Notable for the following:    WBC 10.8 (*)    RBC 5.48 (*)    Hemoglobin 16.0 (*)    All other components within normal limits  COMPREHENSIVE METABOLIC PANEL - Abnormal; Notable for the following:    Potassium 3.2 (*)    CO2 19 (*)    Glucose, Bld 100 (*)    Creatinine, Ser 1.12 (*)    Calcium 8.5 (*)    AST 81 (*)    ALT 174 (*)    All other components within normal limits   Imaging Review No results found. I have personally reviewed and evaluated these images and lab results as part of my medical decision-making.   EKG Interpretation None      MDM  I have reviewed and evaluated the relevant laboratory values. I have reviewed the relevant previous healthcare records. I obtained HPI from historian. Patient discussed with supervising physician  ED Course:  Assessment: Patient is a 35yF presents with abdominal pain and N/V/D since  Sunday. On exam, nontoxic, nonseptic appearing, in no apparent distress. Patient's pain and other symptoms adequately managed in emergency department.  Fluid bolus given as well as reglan.  Labs and vitals reviewed. Potassium 3.2. Given KDur. Creatinine 1.12. Patient does not meet the SIRS or Sepsis criteria.  On repeat exam patient does not have a surgical abdomen and there are no peritoneal signs.  No indication of appendicitis, bowel obstruction, bowel perforation, cholecystitis, diverticulitis, PID or ectopic pregnancy. Symptoms consistent with Viral Gastroenteritis. Patient discharged home with symptomatic treatment and given strict instructions for follow-up with their primary care physician.  I have also discussed reasons to return immediately to the ER.  Patient expresses understanding and agrees with  plan.  Disposition/Plan:  DC Home Additional Verbal discharge instructions given and discussed with patient.  Pt Instructed to f/u with PCP in the next 48 hours for evaluation and treatment of symptoms. Return precautions given Pt acknowledges and agrees with plan  Supervising Physician Quintella Reichert, MD   Final diagnoses:  Viral gastroenteritis     Shary Decamp, PA-C 10/15/15 Lilly, MD 10/16/15 0800

## 2015-10-15 NOTE — ED Notes (Signed)
N/v/d since [facility] gait

## 2015-11-03 ENCOUNTER — Telehealth: Payer: 59 | Admitting: Nurse Practitioner

## 2015-11-03 DIAGNOSIS — H5789 Other specified disorders of eye and adnexa: Secondary | ICD-10-CM

## 2015-11-03 DIAGNOSIS — H578 Other specified disorders of eye and adnexa: Secondary | ICD-10-CM | POA: Diagnosis not present

## 2015-11-03 MED ORDER — POLYMYXIN B-TRIMETHOPRIM 10000-0.1 UNIT/ML-% OP SOLN: 2.0000 [drp] | OPHTHALMIC | Status: DC

## 2015-11-03 NOTE — Progress Notes (Signed)

## 2015-11-12 ENCOUNTER — Ambulatory Visit: Payer: 59 | Admitting: Family Medicine

## 2015-11-18 ENCOUNTER — Encounter: Payer: Self-pay | Admitting: Family Medicine

## 2015-11-18 NOTE — Telephone Encounter (Signed)
Can we refill all 3 scripts?

## 2015-11-19 MED ORDER — OMEPRAZOLE 40 MG PO CPDR: 40.0000 mg | DELAYED_RELEASE_CAPSULE | Freq: Every day | ORAL | Status: DC

## 2015-11-19 MED ORDER — LEVOTHYROXINE SODIUM 112 MCG PO TABS: 112.0000 ug | ORAL_TABLET | Freq: Every day | ORAL | Status: DC

## 2015-11-19 MED ORDER — METOPROLOL SUCCINATE ER 100 MG PO TB24: 100.0000 mg | ORAL_TABLET | Freq: Every day | ORAL | Status: DC

## 2015-11-19 MED FILL — OMEPRAZOLE DR 40 MG CAPSULE: 40 | 90 days supply | Qty: 90 | Fill #0

## 2015-11-19 MED FILL — METOPROLOL SUCC ER 100 MG T: 100 | 90 days supply | Qty: 90 | Fill #0

## 2015-11-19 MED FILL — LEVOTHYROXINE 112 MCG TAB: 112 | 90 days supply | Qty: 90 | Fill #0

## 2015-11-19 NOTE — Telephone Encounter (Signed)
Please fill all 3 for one year

## 2015-12-28 MED FILL — SERTRALINE HCL 25 MG TABLET: 25 | 90 days supply | Qty: 90 | Fill #3

## 2016-02-01 DIAGNOSIS — N911 Secondary amenorrhea: Secondary | ICD-10-CM | POA: Diagnosis not present

## 2016-02-01 DIAGNOSIS — R102 Pelvic and perineal pain: Secondary | ICD-10-CM | POA: Diagnosis not present

## 2016-02-01 DIAGNOSIS — Z01419 Encounter for gynecological examination (general) (routine) without abnormal findings: Secondary | ICD-10-CM | POA: Diagnosis not present

## 2016-02-01 DIAGNOSIS — R87610 Atypical squamous cells of undetermined significance on cytologic smear of cervix (ASC-US): Secondary | ICD-10-CM | POA: Diagnosis not present

## 2016-02-01 MED FILL — MEDROXYPROGESTERONE 10 MG T: 10 | 90 days supply | Qty: 30 | Fill #0

## 2016-02-22 MED FILL — OMEPRAZOLE DR 40 MG CAPSULE: 40 | 90 days supply | Qty: 90 | Fill #1

## 2016-02-22 MED FILL — METOPROLOL SUCC ER 100 MG T: 100 | 90 days supply | Qty: 90 | Fill #1

## 2016-02-22 MED FILL — LEVOTHYROXINE 112 MCG TAB: 112 | 90 days supply | Qty: 90 | Fill #1

## 2016-02-29 DIAGNOSIS — R87610 Atypical squamous cells of undetermined significance on cytologic smear of cervix (ASC-US): Secondary | ICD-10-CM | POA: Diagnosis not present

## 2016-02-29 DIAGNOSIS — N72 Inflammatory disease of cervix uteri: Secondary | ICD-10-CM | POA: Diagnosis not present

## 2016-03-24 ENCOUNTER — Telehealth: Payer: 59 | Admitting: Family

## 2016-03-24 DIAGNOSIS — B9689 Other specified bacterial agents as the cause of diseases classified elsewhere: Secondary | ICD-10-CM

## 2016-03-24 DIAGNOSIS — J028 Acute pharyngitis due to other specified organisms: Principal | ICD-10-CM

## 2016-03-24 DIAGNOSIS — J029 Acute pharyngitis, unspecified: Secondary | ICD-10-CM

## 2016-03-24 MED ORDER — AZITHROMYCIN 250 MG PO TABS: ORAL_TABLET | ORAL | 0 refills | Status: DC

## 2016-03-24 MED ORDER — BENZONATATE 100 MG PO CAPS: 100.0000 mg | ORAL_CAPSULE | Freq: Three times a day (TID) | ORAL | 0 refills | Status: DC | PRN

## 2016-03-24 NOTE — Progress Notes (Signed)

## 2016-03-29 MED FILL — SERTRALINE HCL 25 MG TABLET: 25 | 90 days supply | Qty: 90 | Fill #4

## 2016-05-29 MED FILL — METOPROLOL SUCC ER 100 MG T: 100 | 90 days supply | Qty: 90 | Fill #2

## 2016-05-29 MED FILL — OMEPRAZOLE DR 40 MG CAPSULE: 40 | 90 days supply | Qty: 90 | Fill #2

## 2016-05-29 MED FILL — LEVOTHYROXINE 112 MCG TAB: 112 | 90 days supply | Qty: 90 | Fill #2

## 2016-06-15 DIAGNOSIS — K219 Gastro-esophageal reflux disease without esophagitis: Secondary | ICD-10-CM | POA: Diagnosis not present

## 2016-06-15 DIAGNOSIS — Z87898 Personal history of other specified conditions: Secondary | ICD-10-CM | POA: Diagnosis not present

## 2016-06-15 DIAGNOSIS — E7800 Pure hypercholesterolemia, unspecified: Secondary | ICD-10-CM | POA: Diagnosis not present

## 2016-06-15 DIAGNOSIS — Z6841 Body Mass Index (BMI) 40.0 and over, adult: Secondary | ICD-10-CM | POA: Diagnosis not present

## 2016-06-16 DIAGNOSIS — M25512 Pain in left shoulder: Secondary | ICD-10-CM | POA: Diagnosis not present

## 2016-06-16 DIAGNOSIS — S42255A Nondisplaced fracture of greater tuberosity of left humerus, initial encounter for closed fracture: Secondary | ICD-10-CM | POA: Diagnosis not present

## 2016-06-19 ENCOUNTER — Other Ambulatory Visit (HOSPITAL_COMMUNITY): Payer: Self-pay | Admitting: General Surgery

## 2016-06-19 DIAGNOSIS — Z3183 Encounter for assisted reproductive fertility procedure cycle: Secondary | ICD-10-CM | POA: Diagnosis not present

## 2016-06-24 ENCOUNTER — Telehealth: Payer: 59 | Admitting: Nurse Practitioner

## 2016-06-24 DIAGNOSIS — J111 Influenza due to unidentified influenza virus with other respiratory manifestations: Secondary | ICD-10-CM | POA: Diagnosis not present

## 2016-06-24 MED ORDER — OSELTAMIVIR PHOSPHATE 75 MG PO CAPS: 75.0000 mg | ORAL_CAPSULE | Freq: Two times a day (BID) | ORAL | 0 refills | Status: DC

## 2016-06-24 NOTE — Progress Notes (Signed)

## 2016-06-27 DIAGNOSIS — J209 Acute bronchitis, unspecified: Secondary | ICD-10-CM | POA: Diagnosis not present

## 2016-06-27 MED FILL — SERTRALINE HCL 25 MG TABLET: 25 | 90 days supply | Qty: 90 | Fill #0

## 2016-07-06 DIAGNOSIS — M25512 Pain in left shoulder: Secondary | ICD-10-CM | POA: Diagnosis not present

## 2016-07-13 ENCOUNTER — Ambulatory Visit (HOSPITAL_COMMUNITY): Payer: 59

## 2016-07-17 ENCOUNTER — Encounter: Payer: Self-pay | Admitting: Family Medicine

## 2016-07-20 ENCOUNTER — Telehealth: Payer: 59 | Admitting: Physician Assistant

## 2016-07-20 DIAGNOSIS — J329 Chronic sinusitis, unspecified: Secondary | ICD-10-CM | POA: Diagnosis not present

## 2016-07-20 DIAGNOSIS — B9789 Other viral agents as the cause of diseases classified elsewhere: Secondary | ICD-10-CM | POA: Diagnosis not present

## 2016-07-20 MED ORDER — FLUTICASONE PROPIONATE 50 MCG/ACT NA SUSP: 2.0000 | Freq: Every day | NASAL | 0 refills | Status: DC

## 2016-07-20 NOTE — Progress Notes (Signed)
We are sorry that you are not feeling well.  Here is how we plan to help!  Based on what you have shared with me it looks like you have sinusitis.  Sinusitis is inflammation and infection in the sinus cavities of the head.  Based on your presentation I believe you most likely have Acute Viral Sinusitis.This is an infection most likely caused by a virus. There is not specific treatment for viral sinusitis other than to help you with the symptoms until the infection runs its course.  You may use an oral decongestant such as Mucinex D or if you have glaucoma or high blood pressure use plain Mucinex. Saline nasal spray help and can safely be used as often as needed for congestion, I have prescribed: Fluticasone nasal spray two sprays in each nostril once daily  Some authorities believe that zinc sprays or the use of Echinacea may shorten the course of your symptoms.  Sinus infections are not as easily transmitted as other respiratory infection, however we still recommend that you avoid close contact with loved ones, especially the very young and elderly.  Remember to wash your hands thoroughly throughout the day as this is the number one way to prevent the spread of infection!  Home Care:  Only take medications as instructed by your medical team.  Complete the entire course of an antibiotic.  Do not take these medications with alcohol.  A steam or ultrasonic humidifier can help congestion.  You can place a towel over your head and breathe in the steam from hot water coming from a faucet.  Avoid close contacts especially the very young and the elderly.  Cover your mouth when you cough or sneeze.  Always remember to wash your hands.  Get Help Right Away If:  You develop worsening fever or sinus pain.  You develop a severe head ache or visual changes.  Your symptoms persist after you have completed your treatment plan.  Make sure you  Understand these instructions.  Will watch your  condition.  Will get help right away if you are not doing well or get worse.  Your e-visit answers were reviewed by a board certified advanced clinical practitioner to complete your personal care plan.  Depending on the condition, your plan could have included both over the counter or prescription medications.  If there is a problem please reply  once you have received a response from your provider.  Your safety is important to [place].  If you have drug allergies check your prescription carefully.    You can use MyChart to ask questions about today's visit, request a non-urgent call back, or ask for a work or school excuse for 24 hours related to this e-Visit. If it has been greater than 24 hours you will need to follow up with your provider, or enter a new e-Visit to address those concerns.  You will get an e-mail in the next two days asking about your experience.  I hope that your e-visit has been valuable and will speed your recovery. Thank you for using e-visits.

## 2016-07-25 ENCOUNTER — Other Ambulatory Visit: Payer: Self-pay

## 2016-07-25 ENCOUNTER — Ambulatory Visit (HOSPITAL_COMMUNITY)
Admission: RE | Admit: 2016-07-25 | Discharge: 2016-07-25 | Disposition: A | Discharge status: Home / Self Care | Payer: 59 | Source: Ambulatory Visit | Attending: General Surgery | Admitting: General Surgery

## 2016-07-25 DIAGNOSIS — Z01818 Encounter for other preprocedural examination: Secondary | ICD-10-CM | POA: Diagnosis not present

## 2016-07-27 DIAGNOSIS — M25512 Pain in left shoulder: Secondary | ICD-10-CM | POA: Diagnosis not present

## 2016-07-31 ENCOUNTER — Ambulatory Visit (INDEPENDENT_AMBULATORY_CARE_PROVIDER_SITE_OTHER): Payer: 59 | Admitting: Family Medicine

## 2016-07-31 ENCOUNTER — Encounter: Payer: Self-pay | Admitting: Family Medicine

## 2016-07-31 ENCOUNTER — Telehealth: Payer: 59 | Admitting: Family

## 2016-07-31 VITALS — BP 102/64 | Temp 98.3°F | Ht 71.0 in | Wt 285.0 lb

## 2016-07-31 DIAGNOSIS — E039 Hypothyroidism, unspecified: Secondary | ICD-10-CM

## 2016-07-31 DIAGNOSIS — B9789 Other viral agents as the cause of diseases classified elsewhere: Secondary | ICD-10-CM

## 2016-07-31 DIAGNOSIS — J069 Acute upper respiratory infection, unspecified: Secondary | ICD-10-CM

## 2016-07-31 MED ORDER — BENZONATATE 100 MG PO CAPS: 100.0000 mg | ORAL_CAPSULE | Freq: Three times a day (TID) | ORAL | 0 refills | Status: DC | PRN

## 2016-07-31 MED ORDER — LEVOTHYROXINE SODIUM 150 MCG PO TABS: 150.0000 ug | ORAL_TABLET | Freq: Every day | ORAL | 3 refills | Status: DC

## 2016-07-31 MED FILL — LEVOTHYROXINE 150 MCG TAB: 150 | 90 days supply | Qty: 90 | Fill #0

## 2016-07-31 NOTE — Progress Notes (Signed)
   Subjective:    Patient ID: Jill Baker, female    DOB: Apr 21, 1981, 36 y.o.   MRN: [id number]  HPI Here to discuss some lab results she received from Dr. Greer Pickerel. She saw him for a consultation about bariatric surgery, and they plan to do a gastric sleeve surgery around March or April. She had labs drawn on 07-17-16 and these were remarkable for an elevated TSH at 6.05. She admits to feeling tired lately. Also she says she has felt a little sick for 2 days with stuffy head, PND, and a dry cough. No fever.    Review of Systems  Constitutional: Positive for fatigue. Negative for fever.  HENT: Positive for congestion, postnasal drip, sinus pain and sinus pressure. Negative for sore throat.   Eyes: Negative.   Respiratory: Positive for cough.   Cardiovascular: Negative.   Endocrine: Negative.   Neurological: Negative.        Objective:   Physical Exam  Constitutional: She appears well-developed and well-nourished.  HENT:  Right Ear: External ear normal.  Left Ear: External ear normal.  Nose: Nose normal.  Mouth/Throat: Oropharynx is clear and moist.  Eyes: Conjunctivae are normal.  Neck: Neck supple. No thyromegaly present.  Cardiovascular: Normal rate, regular rhythm, normal heart sounds and intact distal pulses.   Pulmonary/Chest: Effort normal and breath sounds normal.  Lymphadenopathy:    She has no cervical adenopathy.          Assessment & Plan:  First, her hypothyroidism is not adequately treated now, so we will increase her Synthroid to 150 mcg daily. Recheck a TSH in 90 days. Second, she has a viral URI. She will rest, drink fluids, and take Mucinex as needed.  Alysia Penna, MD

## 2016-07-31 NOTE — Progress Notes (Signed)
Pre visit review using our clinic review tool, if applicable. No additional management support is needed unless otherwise documented below in the visit note. 

## 2016-07-31 NOTE — Progress Notes (Signed)
We are sorry that you are not feeling well.  Here is how we plan to help!  Based on what you have shared with me it looks like you have upper respiratory tract inflammation that has resulted in a significant cough.  Inflammation and infection in the upper respiratory tract is commonly called bronchitis and has four common causes:  Allergies, Viral Infections, Acid Reflux and Bacterial Infections.  Allergies, viruses and acid reflux are treated by controlling symptoms or eliminating the cause. An example might be a cough caused by taking certain blood pressure medications. You stop the cough by changing the medication. Another example might be a cough caused by acid reflux. Controlling the reflux helps control the cough.  Based on your presentation I believe you most likely have A cough due to a virus.  This is called viral bronchitis and is best treated by rest, plenty of fluids and control of the cough.  You may use Ibuprofen or Tylenol as directed to help your symptoms.     In addition you may use A non-prescription cough medication called Robitussin DAC. Take 2 teaspoons every 8 hours or Delsym: take 2 teaspoons every 12 hours., A non-prescription cough medication called Mucinex DM: take 2 tablets every 12 hours. and A prescription cough medication called Tessalon Perles 100mg. You may take 1-2 capsules every 8 hours as needed for your cough.   USE OF BRONCHODILATOR ("RESCUE") INHALERS: There is a risk from using your bronchodilator too frequently.  The risk is that over-reliance on a medication which only relaxes the muscles surrounding the breathing tubes can reduce the effectiveness of medications prescribed to reduce swelling and congestion of the tubes themselves.  Although you feel brief relief from the bronchodilator inhaler, your asthma may actually be worsening with the tubes becoming more swollen and filled with mucus.  This can delay other crucial treatments, such as oral steroid medications.  If you need to use a bronchodilator inhaler daily, several times per day, you should discuss this with your provider.  There are probably better treatments that could be used to keep your asthma under control.     HOME CARE . Only take medications as instructed by your medical team. . Complete the entire course of an antibiotic. . Drink plenty of fluids and get plenty of rest. . Avoid close contacts especially the very young and the elderly . Cover your mouth if you cough or cough into your sleeve. . Always remember to wash your hands . A steam or ultrasonic humidifier can help congestion.   GET HELP RIGHT AWAY IF: . You develop worsening fever. . You become short of breath . You cough up blood. . Your symptoms persist after you have completed your treatment plan MAKE SURE YOU   Understand these instructions.  Will watch your condition.  Will get help right away if you are not doing well or get worse.  Your e-visit answers were reviewed by a board certified advanced clinical practitioner to complete your personal care plan.  Depending on the condition, your plan could have included both over the counter or prescription medications. If there is a problem please reply  once you have received a response from your provider. Your safety is important to us.  If you have drug allergies check your prescription carefully.    You can use MyChart to ask questions about today's visit, request a non-urgent call back, or ask for a work or school excuse for 24 hours related to this e-Visit.   If it has been greater than 24 hours you will need to follow up with your provider, or enter a new e-Visit to address those concerns. You will get an e-mail in the next two days asking about your experience.  I hope that your e-visit has been valuable and will speed your recovery. Thank you for using e-visits.   

## 2016-08-01 ENCOUNTER — Telehealth: Payer: Self-pay | Admitting: Family Medicine

## 2016-08-01 ENCOUNTER — Ambulatory Visit: Payer: 59 | Admitting: Dietician

## 2016-08-01 MED ORDER — AZITHROMYCIN 250 MG PO TABS: ORAL_TABLET | ORAL | 0 refills | Status: DC

## 2016-08-01 NOTE — Telephone Encounter (Signed)
I sent script e-scribe to [facility] and spoke with pt.  

## 2016-08-01 NOTE — Telephone Encounter (Signed)
Pt called in stating that she was seen yesterday with no fever.  Today she has 101 temp and coughing up green mucous.  Asking if something can be called in for her. [phone]

## 2016-08-01 NOTE — Telephone Encounter (Signed)
Call in a Zpack  ?

## 2016-08-01 NOTE — Addendum Note (Signed)
Addended by: Aggie Hacker A on: 08/01/2016 05:19 PM   Modules accepted: Orders

## 2016-08-02 ENCOUNTER — Ambulatory Visit: Payer: 59 | Admitting: Skilled Nursing Facility1

## 2016-08-02 DIAGNOSIS — M25512 Pain in left shoulder: Secondary | ICD-10-CM | POA: Diagnosis not present

## 2016-08-22 DIAGNOSIS — M25512 Pain in left shoulder: Secondary | ICD-10-CM | POA: Diagnosis not present

## 2016-08-24 ENCOUNTER — Ambulatory Visit (INDEPENDENT_AMBULATORY_CARE_PROVIDER_SITE_OTHER): Payer: 59 | Admitting: Psychiatry

## 2016-08-24 DIAGNOSIS — F509 Eating disorder, unspecified: Secondary | ICD-10-CM

## 2016-08-25 MED FILL — METOPROLOL SUCC ER 100 MG T: 100 | 90 days supply | Qty: 90 | Fill #3

## 2016-08-25 MED FILL — OMEPRAZOLE DR 40 MG CAPSULE: 40 | 90 days supply | Qty: 90 | Fill #3

## 2016-08-28 DIAGNOSIS — M25512 Pain in left shoulder: Secondary | ICD-10-CM | POA: Diagnosis not present

## 2016-09-08 DIAGNOSIS — Z9889 Other specified postprocedural states: Secondary | ICD-10-CM | POA: Diagnosis not present

## 2016-09-18 ENCOUNTER — Encounter: Payer: Self-pay | Admitting: Skilled Nursing Facility1

## 2016-09-18 ENCOUNTER — Encounter: Payer: 59 | Attending: General Surgery | Admitting: Skilled Nursing Facility1

## 2016-09-18 DIAGNOSIS — Z713 Dietary counseling and surveillance: Secondary | ICD-10-CM | POA: Insufficient documentation

## 2016-09-18 DIAGNOSIS — Z6841 Body Mass Index (BMI) 40.0 and over, adult: Secondary | ICD-10-CM | POA: Insufficient documentation

## 2016-09-18 DIAGNOSIS — E669 Obesity, unspecified: Secondary | ICD-10-CM | POA: Insufficient documentation

## 2016-09-18 NOTE — Patient Instructions (Signed)
Follow Pre-Op Goals Try Protein Shakes Call [facility] at [phone] when surgery is scheduled to enroll in Pre-Op Class  Things to remember:  Please always be honest with [place]. We want to support you!  If you have any questions or concerns in between appointments, please call or email   The diet after surgery will be high protein and low in carbohydrate.  Vitamins and calcium need to be taken for the rest of your life.  Feel free to include support people in any classes or appointments.   Supplement recommendations:  Before Surgery   1 Complete Multivitamin with Iron  3000 IU Vitamin D3  After Surgery   2 Chewable Multivitamins  **Best Choice - Opurity: Bypass and Sleeve Optimized Chewable    Bariatric Advantage Advanced Multi EA      3 Chewable Calcium (500 mg each, total 1200-1500 mg per day)  **Best Choice - Celebrate, Bariatric Advantage, or Wellesse  Other Options:  2 Celebrate MultiComplete with 18 mg Iron (this provides 6000 IU of  Vitamin D3)  4 Celebrate Essential Multi 2 in 1 (has calcium) + 18-60 mg separate  iron  Vitamins and Calcium are available at:   [facility]   [address], [place], [place] [address]   [email or web address]  www.[email or web address]  www.[email or web address]

## 2016-09-18 NOTE — Progress Notes (Signed)
Pre-Op Assessment Visit:  Pre-Operative sleeve gastrectomy Surgery  Medical Nutrition Therapy:  Appt start time: 2:00  End time:  2:40  Patient was seen on 09/18/2016 for Pre-Operative Nutrition Assessment. Assessment and letter of approval faxed to [facility] Surgery Bariatric Surgery Program coordinator on 09/18/2016.  Pt states she works 12 hours 4 days a week.   Start weight at [facility]: 287.5  BMI: 41.85   24 hr Dietary Recall: First Meal: premier protein and cliff protein bar Snack: none Second Meal: [place] sandwich, greek yogurt, diet cookie Snack: none Third Meal: pork roast and vegetables Snack: ice cream, cookies Beverages: water, soda  Encouraged to engage in 150 minutes of moderate physical activity including cardiovascular and weight baring weekly  Handouts given during visit include:  . Pre-Op Goals . Bariatric Surgery Protein Shakes During the appointment today the following Pre-Op Goals were reviewed with the patient: . Maintain or lose weight as instructed by your surgeon . Make healthy food choices . Begin to limit portion sizes . Limited concentrated sugars and fried foods . Keep fat/sugar in the single digits per serving on         food labels . Practice CHEWING your food  (aim for 30 chews per bite or until applesauce consistency) . Practice not drinking 15 minutes before, during, and 30 minutes after each meal/snack . Avoid all carbonated beverages  . Avoid/limit caffeinated beverages  . Avoid all sugar-sweetened beverages . Consume 3 meals per day; eat every 3-5 hours . Make a list of non-food related activities . Aim for 64-100 ounces of FLUID daily  . Aim for at least 60-80 grams of PROTEIN daily . Look for a liquid protein source that contain ?15 g protein and ?5 g carbohydrate  (ex: shakes, drinks, shots)  -Follow diet recommendations listed below   Energy and Macronutrient Recomendations: Calories: 1800 Carbohydrate: 200  Protein:  135 Fat: 50  Demonstrated degree of understanding via:  Teach Back  Teaching Method Utilized:  Visual Auditory Hands on  Barriers to learning/adherence to lifestyle change:  None identified  Patient to call the [facility] to enroll in Pre-Op and Post-Op Nutrition Education when surgery date is scheduled.

## 2016-09-19 ENCOUNTER — Ambulatory Visit: Payer: 59 | Admitting: Psychiatry

## 2016-09-27 MED FILL — SERTRALINE HCL 25 MG TABLET: 25 | 90 days supply | Qty: 90 | Fill #1

## 2016-10-09 ENCOUNTER — Ambulatory Visit: Payer: Self-pay

## 2016-10-10 DIAGNOSIS — H40003 Preglaucoma, unspecified, bilateral: Secondary | ICD-10-CM | POA: Diagnosis not present

## 2016-10-20 ENCOUNTER — Telehealth: Payer: 59 | Admitting: Family

## 2016-10-20 DIAGNOSIS — J029 Acute pharyngitis, unspecified: Secondary | ICD-10-CM | POA: Diagnosis not present

## 2016-10-20 MED ORDER — BENZONATATE 100 MG PO CAPS: 100.0000 mg | ORAL_CAPSULE | Freq: Three times a day (TID) | ORAL | 0 refills | Status: DC | PRN

## 2016-10-20 MED ORDER — PREDNISONE 5 MG PO TABS: 5.0000 mg | ORAL_TABLET | ORAL | 0 refills | Status: DC

## 2016-10-20 NOTE — Progress Notes (Signed)
We are sorry that you are not feeling well.  Here is how we plan to help!  Based on what you have shared with me it looks like you have upper respiratory tract inflammation that has resulted in a significant cough.  Inflammation and infection in the upper respiratory tract is commonly called bronchitis and has four common causes:  Allergies, Viral Infections, Acid Reflux and Bacterial Infections.  Allergies, viruses and acid reflux are treated by controlling symptoms or eliminating the cause. An example might be a cough caused by taking certain blood pressure medications. You stop the cough by changing the medication. Another example might be a cough caused by acid reflux. Controlling the reflux helps control the cough.  Based on your presentation I believe you most likely have A cough due to a virus.  This is called viral bronchitis and is best treated by rest, plenty of fluids and control of the cough.  You may use Ibuprofen or Tylenol as directed to help your symptoms.     In addition you may use A non-prescription cough medication called Mucinex DM: take 2 tablets every 12 hours. and A prescription cough medication called Tessalon Perles 100mg . You may take 1-2 capsules every 8 hours as needed for your cough.  Sterapred 5 mg dosepak   If this has been going on longer than 5 days, please let us know.   USE OF BRONCHODILATOR ("RESCUE") INHALERS: There is a risk from using your bronchodilator too frequently.  The risk is that over-reliance on a medication which only relaxes the muscles surrounding the breathing tubes can reduce the effectiveness of medications prescribed to reduce swelling and congestion of the tubes themselves.  Although you feel brief relief from the bronchodilator inhaler, your asthma may actually be worsening with the tubes becoming more swollen and filled with mucus.  This can delay other crucial treatments, such as oral steroid medications. If you need to use a bronchodilator  inhaler daily, several times per day, you should discuss this with your provider.  There are probably better treatments that could be used to keep your asthma under control.     HOME CARE . Only take medications as instructed by your medical team. . Complete the entire course of an antibiotic. . Drink plenty of fluids and get plenty of rest. . Avoid close contacts especially the very young and the elderly . Cover your mouth if you cough or cough into your sleeve. . Always remember to wash your hands . A steam or ultrasonic humidifier can help congestion.   GET HELP RIGHT AWAY IF: . You develop worsening fever. . You become short of breath . You cough up blood. . Your symptoms persist after you have completed your treatment plan MAKE SURE YOU   Understand these instructions.  Will watch your condition.  Will get help right away if you are not doing well or get worse.  Your e-visit answers were reviewed by a board certified advanced clinical practitioner to complete your personal care plan.  Depending on the condition, your plan could have included both over the counter or prescription medications. If there is a problem please reply  once you have received a response from your provider. Your safety is important to [place].  If you have drug allergies check your prescription carefully.    You can use MyChart to ask questions about today's visit, request a non-urgent call back, or ask for a work or school excuse for 24 hours related to this e-Visit.  If it has been greater than 24 hours you will need to follow up with your provider, or enter a new e-Visit to address those concerns. You will get an e-mail in the next two days asking about your experience.  I hope that your e-visit has been valuable and will speed your recovery. Thank you for using e-visits.   

## 2016-10-31 MED FILL — LEVOTHYROXINE 150 MCG TAB: 150 | 90 days supply | Qty: 90 | Fill #1

## 2016-11-23 ENCOUNTER — Ambulatory Visit (INDEPENDENT_AMBULATORY_CARE_PROVIDER_SITE_OTHER): Payer: 59 | Admitting: Psychiatry

## 2016-11-23 DIAGNOSIS — F509 Eating disorder, unspecified: Secondary | ICD-10-CM | POA: Diagnosis not present

## 2016-11-24 ENCOUNTER — Other Ambulatory Visit: Payer: Self-pay | Admitting: Family Medicine

## 2016-11-24 MED FILL — OMEPRAZOLE DR 40 MG CAPSULE: 40 | 90 days supply | Qty: 90 | Fill #0

## 2016-11-24 MED FILL — METOPROLOL SUCC ER 100 MG T: 100 | 90 days supply | Qty: 90 | Fill #0

## 2016-11-24 NOTE — Telephone Encounter (Signed)
Pt states she called the pharmacy on Monday, but we did not get this request until today. Pt states she will run out this weekend if she does not get her refills today. Thank you  .omeprazole (PRILOSEC) 40 MG capsule metoprolol succinate (TOPROL-XL) 100 MG 24 hr tablet  [place], [place] - [address].

## 2016-11-24 NOTE — Telephone Encounter (Signed)
Refill both for 90 days with 3 rf

## 2016-11-24 NOTE — Telephone Encounter (Signed)
Can we refill? Tried to process Prilosec and there was a flag that came up because of rash.

## 2017-01-01 MED FILL — SERTRALINE HCL 25 MG TABLET: 25 | 90 days supply | Qty: 90 | Fill #0

## 2017-01-08 ENCOUNTER — Encounter: Payer: 59 | Attending: General Surgery | Admitting: Skilled Nursing Facility1

## 2017-01-08 ENCOUNTER — Other Ambulatory Visit: Payer: Self-pay

## 2017-01-08 DIAGNOSIS — K219 Gastro-esophageal reflux disease without esophagitis: Secondary | ICD-10-CM | POA: Diagnosis not present

## 2017-01-08 DIAGNOSIS — Z888 Allergy status to other drugs, medicaments and biological substances status: Secondary | ICD-10-CM | POA: Insufficient documentation

## 2017-01-08 DIAGNOSIS — Z8349 Family history of other endocrine, nutritional and metabolic diseases: Secondary | ICD-10-CM | POA: Insufficient documentation

## 2017-01-08 DIAGNOSIS — E7800 Pure hypercholesterolemia, unspecified: Secondary | ICD-10-CM | POA: Insufficient documentation

## 2017-01-08 DIAGNOSIS — Z811 Family history of alcohol abuse and dependence: Secondary | ICD-10-CM | POA: Insufficient documentation

## 2017-01-08 DIAGNOSIS — E079 Disorder of thyroid, unspecified: Secondary | ICD-10-CM | POA: Insufficient documentation

## 2017-01-08 DIAGNOSIS — Z713 Dietary counseling and surveillance: Secondary | ICD-10-CM | POA: Insufficient documentation

## 2017-01-08 DIAGNOSIS — Z79899 Other long term (current) drug therapy: Secondary | ICD-10-CM | POA: Insufficient documentation

## 2017-01-08 DIAGNOSIS — E669 Obesity, unspecified: Secondary | ICD-10-CM

## 2017-01-08 DIAGNOSIS — Z9889 Other specified postprocedural states: Secondary | ICD-10-CM | POA: Insufficient documentation

## 2017-01-08 NOTE — Patient Outreach (Signed)
[facility]) Care Management  01/08/2017  Jill Baker 01-22-1981 [id number]  Subjective: Telephone call to patient. Discussed [facility] pre-op call. Patient voices understanding and agrees to pre-op call.    Objective: Per chart review, patient with Morbid obesity due to excess calories is scheduled for a laparoscopic gastric sleeve resection on 01/22/17. History of hypothyroidism.  Assessment: Received [facility] pre-op referral on 12/25/16. Pre-Op call completed.  FMLA- Jill Baker states FMLA forms are in the process of being completed. RNCM encouraged client maintain copies for her record. She states she will not be out enough to use short term disability. Hospital indemnity plan discussed and she reports she has the [place] and will follow up with the benefits office for additional information.   Post procedure equipment/home health- RNCM reinforced that the inpatient case manager will arrange home health and home equipment needs prior to discharge.    RNCM discussed [place] benefit is higher when using a  facility/pharmacy. RNCM reinforced that if she is discharged home on the weekend with a new prescription, she will have to use a local pharmacy due to [facility] pharmacies are not open on the weekend.     Support:  Client reports that she has someone to assist with pharmacy needs at discharge. Client reports she has transportation to her follow up appointment.     Discussed Advanced Directives. RNCM reinforced that Spiritual care department is available to assist [employer] employees with Advanced Directives as needed.  No other medical issues identified and no additional community resource information needs at this time.   Patient is agreeable to follow up post procedure call.  Plan: telephonic RNCM will follow up post discharge within 3 business days of notification.  Thea Silversmith, RN, MSN, [place] Coordinator Cell:  [phone]

## 2017-01-09 ENCOUNTER — Encounter: Payer: Self-pay | Admitting: Skilled Nursing Facility1

## 2017-01-09 NOTE — Progress Notes (Signed)
Pre-Operative Nutrition Class:  Appt start time: 5797   End time:  1830.  Patient was seen on 01/08/2017 for Pre-Operative Bariatric Surgery Education [facility].   Surgery date: 01/22/2017 Surgery type: sleeve Start weight at [facility]: 287.8 Weight today: 292.5  Samples given per MNT protocol. Patient educated on appropriate usage: celebrateMultivitamin Lot # [id number] Exp: 12/2017  celebrateCalcium Citrate Lot # [id number] Exp: 10/2017  Renee Pain Protein shake Lot # [id number] Exp: Nov 27, 2017  The following the learning objectives were met by the patient during this course:  Identify Pre-Op Dietary Goals and will begin 2 weeks pre-operatively  Identify appropriate sources of fluids and proteins   State protein recommendations and appropriate sources pre and post-operatively  Identify Post-Operative Dietary Goals and will follow for 2 weeks post-operatively  Identify appropriate multivitamin and calcium sources  Describe the need for physical activity post-operatively and will follow MD recommendations  State when to call healthcare provider regarding medication questions or post-operative complications  Handouts given during class include:  Pre-Op Bariatric Surgery Diet Handout  Protein Shake Handout  Post-Op Bariatric Surgery Nutrition Handout  [facility] Program Information Flyer  Support Group Information Flyer  [facility] Bariatric Supplements Price List  Follow-Up Plan: [facility] for diet advancement per MD.

## 2017-01-12 NOTE — Progress Notes (Signed)
07-25-16 (EPIC) EKG, CXR

## 2017-01-12 NOTE — Progress Notes (Signed)
Preop on 7/16.  Need orders in epic

## 2017-01-12 NOTE — Patient Instructions (Addendum)
JAMISON YUHASZ  01/12/2017   Your procedure is scheduled on: 01-22-17   Report to [facility] Main  Entrance Take [place]  Elevators to 3rd floor to  [place] at 5:15 AM.    Call this number if you have problems the morning of surgery [phone]    Remember: ONLY 1 PERSON MAY GO WITH YOU TO SHORT STAY TO GET  READY MORNING OF [place].  Do not eat food or drink liquids :After Midnight.     Take these medicines the morning of surgery with A SIP OF WATER: Levothyroxine (Synthroid), Metoprolol (Toprol-XL), Omeprazole (Prilosec),                                 You may not have any metal on your body including hair pins and              piercings  Do not wear jewelry, make-up, lotions, powders or perfumes, deodorant             Do not wear nail polish.  Do not shave  48 hours prior to surgery.                Do not bring valuables to the hospital. [place].  Contacts, dentures or bridgework may not be worn into surgery.  Leave suitcase in the car. After surgery it may be brought to your room.                Please read over the following fact sheets you were given: _____________________________________________________________________             [facility] - Preparing for Surgery Before surgery, you can play an important role.  Because skin is not sterile, your skin needs to be as free of germs as possible.  You can reduce the number of germs on your skin by washing with CHG (chlorahexidine gluconate) soap before surgery.  CHG is an antiseptic cleaner which kills germs and bonds with the skin to continue killing germs even after washing. Please DO NOT use if you have an allergy to CHG or antibacterial soaps.  If your skin becomes reddened/irritated stop using the CHG and inform your nurse when you arrive at Short Stay. Do not shave (including legs and underarms) for at least 48 hours prior to the  first CHG shower.  You may shave your face/neck. Please follow these instructions carefully:  1.  Shower with CHG Soap the night before surgery and the  morning of Surgery.  2.  If you choose to wash your hair, wash your hair first as usual with your  normal  shampoo.  3.  After you shampoo, rinse your hair and body thoroughly to remove the  shampoo.                           4.  Use CHG as you would any other liquid soap.  You can apply chg directly  to the skin and wash                       Gently with a scrungie or clean washcloth.  5.  Apply the  CHG Soap to your body ONLY FROM THE NECK DOWN.   Do not use on face/ open                           Wound or open sores. Avoid contact with eyes, ears mouth and genitals (private parts).                       Wash face,  Genitals (private parts) with your normal soap.             6.  Wash thoroughly, paying special attention to the area where your surgery  will be performed.  7.  Thoroughly rinse your body with warm water from the neck down.  8.  DO NOT shower/wash with your normal soap after using and rinsing off  the CHG Soap.                9.  Pat yourself dry with a clean towel.            10.  Wear clean pajamas.            11.  Place clean sheets on your bed the night of your first shower and do not  sleep with pets. Day of Surgery : Do not apply any lotions/deodorants the morning of surgery.  Please wear clean clothes to the hospital/surgery center.  FAILURE TO FOLLOW THESE INSTRUCTIONS MAY RESULT IN THE CANCELLATION OF YOUR SURGERY PATIENT SIGNATURE_________________________________  NURSE SIGNATURE__________________________________  ________________________________________________________________________

## 2017-01-15 ENCOUNTER — Encounter (HOSPITAL_COMMUNITY)
Admission: RE | Admit: 2017-01-15 | Discharge: 2017-01-15 | Disposition: A | Discharge status: Home / Self Care | Payer: 59 | Source: Ambulatory Visit | Attending: General Surgery | Admitting: General Surgery

## 2017-01-15 ENCOUNTER — Encounter (HOSPITAL_COMMUNITY): Payer: Self-pay

## 2017-01-15 DIAGNOSIS — Z01812 Encounter for preprocedural laboratory examination: Secondary | ICD-10-CM | POA: Insufficient documentation

## 2017-01-15 HISTORY — DX: Other specified postprocedural states: Z98.890

## 2017-01-15 HISTORY — DX: Other specified postprocedural states: R11.2

## 2017-01-15 LAB — BASIC METABOLIC PANEL
ANION GAP: 8 (ref 5–15)
BUN: 17 mg/dL (ref 6–20)
CHLORIDE: 109 mmol/L (ref 101–111)
CO2: 22 mmol/L (ref 22–32)
Calcium: 8.9 mg/dL (ref 8.9–10.3)
Creatinine, Ser: 1.04 mg/dL — ABNORMAL HIGH (ref 0.44–1.00)
GFR calc Af Amer: >60 mL/min (ref >60)
GLUCOSE: 116 mg/dL — AB (ref 65–99)
POTASSIUM: 3.8 mmol/L (ref 3.5–5.1)
Sodium: 139 mmol/L (ref 135–145)

## 2017-01-15 LAB — CBC
HEMATOCRIT: 44 % (ref 36.0–46.0)
HEMOGLOBIN: 15.2 g/dL — AB (ref 12.0–15.0)
MCH: 28.9 pg (ref 26.0–34.0)
MCHC: 34.5 g/dL (ref 30.0–36.0)
MCV: 83.7 fL (ref 78.0–100.0)
PLATELETS: 239 10*3/uL (ref 150–400)
RBC: 5.26 MIL/uL — AB (ref 3.87–5.11)
RDW: 13.7 % (ref 11.5–15.5)
WBC: 8.1 10*3/uL (ref 4.0–10.5)

## 2017-01-15 LAB — HCG, SERUM, QUALITATIVE: Preg, Serum: NEGATIVE

## 2017-01-17 DIAGNOSIS — H40003 Preglaucoma, unspecified, bilateral: Secondary | ICD-10-CM | POA: Diagnosis not present

## 2017-01-18 ENCOUNTER — Ambulatory Visit: Payer: Self-pay | Admitting: General Surgery

## 2017-01-18 DIAGNOSIS — K219 Gastro-esophageal reflux disease without esophagitis: Secondary | ICD-10-CM | POA: Diagnosis not present

## 2017-01-18 DIAGNOSIS — E039 Hypothyroidism, unspecified: Secondary | ICD-10-CM | POA: Diagnosis not present

## 2017-01-18 DIAGNOSIS — E781 Pure hyperglyceridemia: Secondary | ICD-10-CM | POA: Diagnosis not present

## 2017-01-18 DIAGNOSIS — E7800 Pure hypercholesterolemia, unspecified: Secondary | ICD-10-CM | POA: Diagnosis not present

## 2017-01-18 DIAGNOSIS — Z6841 Body Mass Index (BMI) 40.0 and over, adult: Secondary | ICD-10-CM | POA: Diagnosis not present

## 2017-01-18 MED FILL — ONDANSETRON ODT 4 MG TABLET: 4 | 7 days supply | Qty: 20 | Fill #0

## 2017-01-18 NOTE — H&P (Signed)
Jill Baker 01/18/2017 8:38 AM Location: [facility] Patient #: [phone] DOB: 1980-12-30 Single / Language: Jill Baker / Race: White Female  History of Present Illness Jill Baker M. Jill Harriger MD; 01/18/2017 8:57 AM) The patient is a 36 year old female who presents for a pre-op visit. She comes in today for her preoperative appointment. She is scheduled for laparoscopic sleeve gastrectomy next week. I initially met her in December 2017. Her initial visit weight was 280 pounds. Her only medical changes since she was initially seen was that she in for sleep broke her left shoulder. It did not require surgery. Otherwise she denies any medical changes. She denies any chest pain, chest pressure, shortness of breath, orthopnea, dyspnea on exertion, paroxysmal nocturnal dyspnea, peripheral edema, melena, hematochezia, diarrhea or constipation. She still taking reflux medication on daily basis. If she takes it she has no reflux issues. She denies any dysuria or hematuria. She is still not smoking.  Her upper GI was unremarkable. There is no evidence of stricture or hiatal hernia. Her bariatric evaluation labwork was reviewed. Her TSH was 6 and her medication was subsequently adjusted by her family care physician. Total cholesterol level 211, triglycerides302 LDL 108  She takes Lopressor for tachycardia   Problem List/Past Medical Jill Baker M. Jill Pulling, MD; 01/18/2017 8:57 AM) HISTORY OF PREDIABETES (Z87.898) MORBID OBESITY WITH BMI OF 40.0-44.9, ADULT (E66.01) HYPERTRIGLYCERIDEMIA (E78.1)  Past Surgical History Jill Baker M. Jill Pulling, MD; 01/18/2017 8:57 AM) Cesarean Section - 1 Foot Surgery Bilateral. Gallbladder Surgery - Laparoscopic  Diagnostic Studies History Jill Baker M. Jill Pulling, MD; 01/18/2017 8:57 AM) Colonoscopy 5-10 years ago Mammogram 1-3 years ago Pap Smear 1-5 years ago  Allergies Jill Baker M. Jill Pulling, MD; 01/18/2017 8:57 AM) NexIUM *ULCER DRUGS* Rash.  Medication History Jill Baker M.  Jill Pulling, MD; 01/18/2017 8:57 AM) Medications Reconciled OxyCODONE HCl (5MG/5ML Solution, 5 Milliliter Oral every four hours, as needed, Taken starting 01/18/2017) Active. Zofran ODT (4MG Tablet Disint, 1 (one) Tablet Oral every eight hours, as needed, Taken starting 01/18/2017) Active. Levothyroxine Sodium (112MCG Tablet, Oral) Active. Metoprolol Succinate ER (100MG Tablet ER 24HR, Oral) Active. Omeprazole (40MG Capsule DR, Oral) Active. Sertraline HCl (25MG Tablet, Oral) Active. Ibuprofen (200MG Capsule, Oral as needed) Active.  Social History Jill Baker M. Jill Pulling, MD; 01/18/2017 8:57 AM) Alcohol use Occasional alcohol use. Caffeine use Carbonated beverages. No drug use Tobacco use Never smoker.  Family History Jill Baker M. Jill Pulling, MD; 01/18/2017 8:57 AM) Alcohol Abuse Father. Migraine Headache Mother. Thyroid problems Mother.  Pregnancy / Birth History Jill Baker M. Jill Pulling, MD; 01/18/2017 8:57 AM) Age at menarche 47 years. Gravida 1 Irregular periods Maternal age 49-30 Para 2  Other Problems Jill Baker M. Jill Pulling, MD; 01/18/2017 8:57 AM) GASTROESOPHAGEAL REFLUX DISEASE, ESOPHAGITIS PRESENCE NOT SPECIFIED (K21.9) HYPERCHOLESTEREMIA (E78.00) HYPOTHYROIDISM, ADULT (E03.9)    Vitals (Jill Baker CMA; 01/18/2017 8:39 AM) 01/18/2017 8:38 AM Weight: 290.8 lb Height: 70in Body Surface Area: 2.45 m Body Mass Index: 41.73 kg/m  Pulse: 76 (Regular)  BP: 130/90 (Sitting, Left Arm, Standard)      Physical Exam Jill Baker M. Jill Kerney MD; 01/18/2017 8:54 AM)  General Mental Status-Alert. General Appearance-Consistent with stated age. Hydration-Well hydrated. Voice-Normal. Note: morbidly obese  Head and Neck Head-normocephalic, atraumatic with no lesions or palpable masses. Trachea-midline. Thyroid Gland Characteristics - normal size and consistency.  Eye Eyeball - Bilateral-Extraocular movements intact. Sclera/Conjunctiva - Bilateral-No scleral  icterus.  ENMT Note: normal external ears mouth intact  Chest and Lung Exam Chest and lung exam reveals -quiet, even and easy respiratory effort with no use  of accessory muscles and on auscultation, normal breath sounds, no adventitious sounds and normal vocal resonance. Inspection Chest Wall - Normal. Back - normal.  Breast - Did not examine.  Cardiovascular Cardiovascular examination reveals -normal heart sounds, regular rate and rhythm with no murmurs and normal pedal pulses bilaterally.  Abdomen Inspection Inspection of the abdomen reveals - No Hernias. Skin - Scar - no surgical scars. Palpation/Percussion Palpation and Percussion of the abdomen reveal - Soft, Non Tender, No Rebound tenderness, No Rigidity (guarding) and No hepatosplenomegaly. Auscultation Auscultation of the abdomen reveals - Bowel sounds normal.  Peripheral Vascular Upper Extremity Palpation - Pulses bilaterally normal.  Neurologic Neurologic evaluation reveals -alert and oriented x 3 with no impairment of recent or remote memory. Mental Status-Normal.  Neuropsychiatric The patient's mood and affect are described as -normal. Judgment and Insight-insight is appropriate concerning matters relevant to self.  Musculoskeletal Normal Exam - Left-Upper Extremity Strength Normal and Lower Extremity Strength Normal. Normal Exam - Right-Upper Extremity Strength Normal and Lower Extremity Strength Normal.  Lymphatic Head & Neck  General Head & Neck Lymphatics: Bilateral - Description - Normal. Axillary - Did not examine. Femoral & Inguinal - Did not examine.    Assessment & Plan Jill Baker M. Jill Merryfield MD; 01/18/2017 8:57 AM)  MORBID OBESITY WITH BMI OF 40.0-44.9, ADULT (E66.01) Impression: We reviewed her preoperative workup. We discussed the results of her EKG, chest x-ray upper GI and blood work. We rediscussed the typical hospitalization and postoperative recovery course. We rediscussed the  typical postoperative issues. She was given her postoperative prescriptions today. We discussed the importance of the preoperative meal plan. All of her questions were asked and answered  Current Plans Pt Education - EMW_preopbariatric Started OxyCODONE HCl 5MG/5ML, 5 Milliliter every four hours, as needed, 100 Milliliter, 01/18/2017, No Refill. Started Zofran ODT 4MG, 1 (one) Tablet every eight hours, as needed, #20, 01/18/2017, No Refill. HYPERCHOLESTEREMIA (E78.00)  GASTROESOPHAGEAL REFLUX DISEASE, ESOPHAGITIS PRESENCE NOT SPECIFIED (K21.9) Impression: With respect to gastroesophageal reflux disease. We discussed that sometimes this can worsen with sleeve gastrectomy. Before we definitively proceed with surgery I need to review her outside upper endoscopy report. We will also need to see what her upper GI looks like. We also discussed the possibility of needing a repeat upper endoscopy.  HYPOTHYROIDISM, ADULT (E03.9)  HYPERTRIGLYCERIDEMIA (E78.1)  [name]. Jill Pulling, MD, FACS General, Bariatric, & Minimally Invasive Surgery [facility], [place]

## 2017-01-19 MED FILL — oxyCODONE HCL 5 MG/5ML SOLN: 5 | 3 days supply | Qty: 100 | Fill #0

## 2017-01-22 ENCOUNTER — Encounter (HOSPITAL_COMMUNITY)
Admission: RE | Disposition: A | Discharge status: Home / Self Care | Payer: Self-pay | Source: Ambulatory Visit | Attending: General Surgery

## 2017-01-22 ENCOUNTER — Encounter (HOSPITAL_COMMUNITY): Payer: Self-pay | Admitting: *Deleted

## 2017-01-22 ENCOUNTER — Inpatient Hospital Stay (HOSPITAL_COMMUNITY): Payer: 59 | Admitting: Certified Registered Nurse Anesthetist

## 2017-01-22 ENCOUNTER — Inpatient Hospital Stay (HOSPITAL_COMMUNITY)
Admission: RE | Admit: 2017-01-22 | Discharge: 2017-01-24 | DRG: 621 | Disposition: A | Discharge status: Home / Self Care | Payer: 59 | Source: Ambulatory Visit | Attending: General Surgery | Admitting: General Surgery

## 2017-01-22 DIAGNOSIS — E781 Pure hyperglyceridemia: Secondary | ICD-10-CM | POA: Diagnosis present

## 2017-01-22 DIAGNOSIS — E7800 Pure hypercholesterolemia, unspecified: Secondary | ICD-10-CM | POA: Diagnosis present

## 2017-01-22 DIAGNOSIS — I10 Essential (primary) hypertension: Secondary | ICD-10-CM | POA: Diagnosis present

## 2017-01-22 DIAGNOSIS — Z9884 Bariatric surgery status: Secondary | ICD-10-CM

## 2017-01-22 DIAGNOSIS — E039 Hypothyroidism, unspecified: Secondary | ICD-10-CM | POA: Diagnosis present

## 2017-01-22 DIAGNOSIS — K219 Gastro-esophageal reflux disease without esophagitis: Secondary | ICD-10-CM | POA: Diagnosis present

## 2017-01-22 DIAGNOSIS — K449 Diaphragmatic hernia without obstruction or gangrene: Secondary | ICD-10-CM | POA: Diagnosis not present

## 2017-01-22 DIAGNOSIS — Z6841 Body Mass Index (BMI) 40.0 and over, adult: Secondary | ICD-10-CM

## 2017-01-22 DIAGNOSIS — K760 Fatty (change of) liver, not elsewhere classified: Secondary | ICD-10-CM | POA: Diagnosis not present

## 2017-01-22 DIAGNOSIS — K2950 Unspecified chronic gastritis without bleeding: Secondary | ICD-10-CM | POA: Diagnosis not present

## 2017-01-22 DIAGNOSIS — E66813 Obesity, class 3: Secondary | ICD-10-CM | POA: Diagnosis present

## 2017-01-22 HISTORY — PX: LAPAROSCOPIC GASTRIC SLEEVE RESECTION WITH HIATAL HERNIA REPAIR: SHX6512

## 2017-01-22 HISTORY — PX: UPPER GI ENDOSCOPY: SHX6162

## 2017-01-22 LAB — HEMOGLOBIN AND HEMATOCRIT, BLOOD
HEMATOCRIT: 43.7 % (ref 36.0–46.0)
HEMOGLOBIN: 14.8 g/dL (ref 12.0–15.0)

## 2017-01-22 SURGERY — GASTRECTOMY, SLEEVE, LAPAROSCOPIC, WITH HIATAL HERNIA REPAIR
Anesthesia: General | Site: Esophagus

## 2017-01-22 MED ORDER — HYDROMORPHONE HCL-NACL 0.5-0.9 MG/ML-% IV SOSY
0.2500 mg | PREFILLED_SYRINGE | INTRAVENOUS | Status: DC | PRN
  Administered 2017-01-22: 0.25 mg via INTRAVENOUS

## 2017-01-22 MED ORDER — SCOPOLAMINE 1 MG/3DAYS TD PT72
MEDICATED_PATCH | TRANSDERMAL | Status: AC
  Filled 2017-01-22: qty 1

## 2017-01-22 MED ORDER — PROMETHAZINE HCL 25 MG/ML IJ SOLN
6.2500 mg | INTRAMUSCULAR | Status: AC | PRN
  Administered 2017-01-22 (×2): 6.25 mg via INTRAVENOUS

## 2017-01-22 MED ORDER — LACTATED RINGERS IV SOLN
INTRAVENOUS | Status: DC | PRN
  Administered 2017-01-22 (×2): via INTRAVENOUS

## 2017-01-22 MED ORDER — GABAPENTIN 300 MG PO CAPS
300.0000 mg | ORAL_CAPSULE | ORAL | Status: AC
  Administered 2017-01-22: 300 mg via ORAL
  Filled 2017-01-22: qty 1

## 2017-01-22 MED ORDER — ACETAMINOPHEN 160 MG/5ML PO SOLN: 325.0000 mg | ORAL | Status: DC | PRN

## 2017-01-22 MED ORDER — SCOPOLAMINE 1 MG/3DAYS TD PT72
1.0000 | MEDICATED_PATCH | TRANSDERMAL | Status: DC
  Administered 2017-01-22: 1.5 mg via TRANSDERMAL
  Filled 2017-01-22: qty 1

## 2017-01-22 MED ORDER — LIDOCAINE HCL (CARDIAC) 20 MG/ML IV SOLN
INTRAVENOUS | Status: DC | PRN
  Administered 2017-01-22: 80 mg via INTRATRACHEAL

## 2017-01-22 MED ORDER — METOPROLOL TARTRATE 5 MG/5ML IV SOLN
5.0000 mg | Freq: Four times a day (QID) | INTRAVENOUS | Status: AC
  Filled 2017-01-22 (×2): qty 5

## 2017-01-22 MED ORDER — MORPHINE SULFATE (PF) 2 MG/ML IV SOLN
1.0000 mg | INTRAVENOUS | Status: DC | PRN
  Administered 2017-01-22 – 2017-01-23 (×4): 2 mg via INTRAVENOUS
  Filled 2017-01-22 (×4): qty 1

## 2017-01-22 MED ORDER — BUPIVACAINE LIPOSOME 1.3 % IJ SUSP
20.0000 mL | Freq: Once | INTRAMUSCULAR | Status: AC
  Administered 2017-01-22: 20 mL
  Filled 2017-01-22: qty 20

## 2017-01-22 MED ORDER — ACETAMINOPHEN 500 MG PO TABS
1000.0000 mg | ORAL_TABLET | ORAL | Status: AC
  Administered 2017-01-22: 1000 mg via ORAL
  Filled 2017-01-22: qty 2

## 2017-01-22 MED ORDER — LIDOCAINE 2% (20 MG/ML) 5 ML SYRINGE
INTRAMUSCULAR | Status: AC
  Filled 2017-01-22: qty 5

## 2017-01-22 MED ORDER — PREMIER PROTEIN SHAKE
2.0000 [oz_av] | ORAL | Status: DC
  Administered 2017-01-24 (×4): 2 [oz_av] via ORAL

## 2017-01-22 MED ORDER — MIDAZOLAM HCL 5 MG/5ML IJ SOLN
INTRAMUSCULAR | Status: DC | PRN
  Administered 2017-01-22: 2 mg via INTRAVENOUS

## 2017-01-22 MED ORDER — ONDANSETRON HCL 4 MG/2ML IJ SOLN
INTRAMUSCULAR | Status: DC | PRN
  Administered 2017-01-22: 8 mg via INTRAVENOUS

## 2017-01-22 MED ORDER — KETAMINE HCL 10 MG/ML IJ SOLN
INTRAMUSCULAR | Status: AC
  Filled 2017-01-22: qty 1

## 2017-01-22 MED ORDER — LIDOCAINE 2% (20 MG/ML) 5 ML SYRINGE
INTRAMUSCULAR | Status: DC | PRN
  Administered 2017-01-22: 1.5 mg/kg/h via INTRAVENOUS

## 2017-01-22 MED ORDER — CEFOTETAN DISODIUM-DEXTROSE 2-2.08 GM-% IV SOLR
2.0000 g | INTRAVENOUS | Status: AC
  Administered 2017-01-22: 2 g via INTRAVENOUS

## 2017-01-22 MED ORDER — EPHEDRINE SULFATE 50 MG/ML IJ SOLN
INTRAMUSCULAR | Status: DC | PRN
  Administered 2017-01-22: 15 mg via INTRAVENOUS

## 2017-01-22 MED ORDER — DEXAMETHASONE SODIUM PHOSPHATE 10 MG/ML IJ SOLN
INTRAMUSCULAR | Status: AC
  Filled 2017-01-22: qty 1

## 2017-01-22 MED ORDER — PROMETHAZINE HCL 25 MG/ML IJ SOLN
INTRAMUSCULAR | Status: AC
  Filled 2017-01-22: qty 1

## 2017-01-22 MED ORDER — CHLORHEXIDINE GLUCONATE 4 % EX LIQD: 60.0000 mL | Freq: Once | CUTANEOUS | Status: DC

## 2017-01-22 MED ORDER — DIPHENHYDRAMINE HCL 50 MG/ML IJ SOLN: 12.5000 mg | Freq: Three times a day (TID) | INTRAMUSCULAR | Status: DC | PRN

## 2017-01-22 MED ORDER — HYDROMORPHONE HCL-NACL 0.5-0.9 MG/ML-% IV SOSY
PREFILLED_SYRINGE | INTRAVENOUS | Status: AC
  Filled 2017-01-22: qty 1

## 2017-01-22 MED ORDER — ONDANSETRON HCL 4 MG/2ML IJ SOLN
INTRAMUSCULAR | Status: AC
  Filled 2017-01-22: qty 4

## 2017-01-22 MED ORDER — PHENYLEPHRINE 40 MCG/ML (10ML) SYRINGE FOR IV PUSH (FOR BLOOD PRESSURE SUPPORT)
PREFILLED_SYRINGE | INTRAVENOUS | Status: AC
  Filled 2017-01-22: qty 10

## 2017-01-22 MED ORDER — SUGAMMADEX SODIUM 200 MG/2ML IV SOLN
INTRAVENOUS | Status: AC
  Filled 2017-01-22: qty 2

## 2017-01-22 MED ORDER — KCL IN DEXTROSE-NACL 20-5-0.45 MEQ/L-%-% IV SOLN
INTRAVENOUS | Status: DC
  Administered 2017-01-22: 12:00:00 via INTRAVENOUS
  Administered 2017-01-22 – 2017-01-23 (×2): 1000 mL via INTRAVENOUS
  Administered 2017-01-23: 125 mL/h via INTRAVENOUS
  Administered 2017-01-23 – 2017-01-24 (×2): 1000 mL via INTRAVENOUS
  Filled 2017-01-22 (×7): qty 1000

## 2017-01-22 MED ORDER — SUCCINYLCHOLINE CHLORIDE 200 MG/10ML IV SOSY
PREFILLED_SYRINGE | INTRAVENOUS | Status: AC
  Filled 2017-01-22: qty 10

## 2017-01-22 MED ORDER — PANTOPRAZOLE SODIUM 40 MG IV SOLR
40.0000 mg | Freq: Every day | INTRAVENOUS | Status: DC
  Administered 2017-01-22 – 2017-01-23 (×2): 40 mg via INTRAVENOUS
  Filled 2017-01-22 (×2): qty 40

## 2017-01-22 MED ORDER — OXYCODONE HCL 5 MG/5ML PO SOLN
5.0000 mg | ORAL | Status: DC | PRN
  Administered 2017-01-23 – 2017-01-24 (×3): 5 mg via ORAL
  Filled 2017-01-22 (×3): qty 5

## 2017-01-22 MED ORDER — ROCURONIUM BROMIDE 50 MG/5ML IV SOSY
PREFILLED_SYRINGE | INTRAVENOUS | Status: AC
  Filled 2017-01-22: qty 5

## 2017-01-22 MED ORDER — MEPERIDINE HCL 50 MG/ML IJ SOLN: 6.2500 mg | INTRAMUSCULAR | Status: DC | PRN

## 2017-01-22 MED ORDER — CEFOTETAN DISODIUM-DEXTROSE 2-2.08 GM-% IV SOLR
INTRAVENOUS | Status: AC
  Filled 2017-01-22: qty 50

## 2017-01-22 MED ORDER — HEPARIN SODIUM (PORCINE) 5000 UNIT/ML IJ SOLN
5000.0000 [IU] | INTRAMUSCULAR | Status: AC
  Administered 2017-01-22: 5000 [IU] via SUBCUTANEOUS
  Filled 2017-01-22: qty 1

## 2017-01-22 MED ORDER — KETAMINE HCL 10 MG/ML IJ SOLN
INTRAMUSCULAR | Status: DC | PRN
  Administered 2017-01-22: 35 mg via INTRAVENOUS
  Administered 2017-01-22: 10 mg via INTRAVENOUS

## 2017-01-22 MED ORDER — ACETAMINOPHEN 325 MG PO TABS: 650.0000 mg | ORAL_TABLET | ORAL | Status: DC | PRN

## 2017-01-22 MED ORDER — PROPOFOL 10 MG/ML IV BOLUS
INTRAVENOUS | Status: DC | PRN
  Administered 2017-01-22: 200 mg via INTRAVENOUS

## 2017-01-22 MED ORDER — PROMETHAZINE HCL 25 MG/ML IJ SOLN
12.5000 mg | Freq: Four times a day (QID) | INTRAMUSCULAR | Status: DC | PRN
  Administered 2017-01-22 – 2017-01-23 (×2): 12.5 mg via INTRAVENOUS
  Filled 2017-01-22 (×2): qty 1

## 2017-01-22 MED ORDER — SUCCINYLCHOLINE CHLORIDE 20 MG/ML IJ SOLN
INTRAMUSCULAR | Status: DC | PRN
  Administered 2017-01-22: 140 mg via INTRAVENOUS

## 2017-01-22 MED ORDER — 0.9 % SODIUM CHLORIDE (POUR BTL) OPTIME
TOPICAL | Status: DC | PRN
  Administered 2017-01-22: 1000 mL

## 2017-01-22 MED ORDER — PROPOFOL 10 MG/ML IV BOLUS
INTRAVENOUS | Status: AC
  Filled 2017-01-22: qty 20

## 2017-01-22 MED ORDER — PHENYLEPHRINE HCL 10 MG/ML IJ SOLN
INTRAMUSCULAR | Status: DC | PRN
  Administered 2017-01-22 (×2): 80 ug via INTRAVENOUS

## 2017-01-22 MED ORDER — ONDANSETRON HCL 4 MG/2ML IJ SOLN
4.0000 mg | Freq: Four times a day (QID) | INTRAMUSCULAR | Status: DC | PRN
  Administered 2017-01-22 – 2017-01-23 (×6): 4 mg via INTRAVENOUS
  Filled 2017-01-22 (×7): qty 2

## 2017-01-22 MED ORDER — FENTANYL CITRATE (PF) 250 MCG/5ML IJ SOLN
INTRAMUSCULAR | Status: AC
  Filled 2017-01-22: qty 5

## 2017-01-22 MED ORDER — STERILE WATER FOR IRRIGATION IR SOLN
Status: DC | PRN
  Administered 2017-01-22: 1000 mL

## 2017-01-22 MED ORDER — LACTATED RINGERS IR SOLN
Status: DC | PRN
  Administered 2017-01-22: 1000 mL

## 2017-01-22 MED ORDER — APREPITANT 40 MG PO CAPS
40.0000 mg | ORAL_CAPSULE | ORAL | Status: AC
  Administered 2017-01-22: 40 mg via ORAL
  Filled 2017-01-22: qty 1

## 2017-01-22 MED ORDER — LIDOCAINE 2% (20 MG/ML) 5 ML SYRINGE
INTRAMUSCULAR | Status: AC
  Filled 2017-01-22: qty 10

## 2017-01-22 MED ORDER — ENOXAPARIN SODIUM 30 MG/0.3ML ~~LOC~~ SOLN
30.0000 mg | Freq: Two times a day (BID) | SUBCUTANEOUS | Status: DC
  Administered 2017-01-22 – 2017-01-24 (×4): 30 mg via SUBCUTANEOUS
  Filled 2017-01-22 (×4): qty 0.3

## 2017-01-22 MED ORDER — FENTANYL CITRATE (PF) 250 MCG/5ML IJ SOLN
INTRAMUSCULAR | Status: DC | PRN
  Administered 2017-01-22 (×2): 100 ug via INTRAVENOUS
  Administered 2017-01-22: 50 ug via INTRAVENOUS

## 2017-01-22 MED ORDER — MIDAZOLAM HCL 2 MG/2ML IJ SOLN
INTRAMUSCULAR | Status: AC
  Filled 2017-01-22: qty 2

## 2017-01-22 MED ORDER — ROCURONIUM BROMIDE 100 MG/10ML IV SOLN
INTRAVENOUS | Status: DC | PRN
  Administered 2017-01-22: 50 mg via INTRAVENOUS
  Administered 2017-01-22 (×2): 10 mg via INTRAVENOUS
  Administered 2017-01-22 (×2): 20 mg via INTRAVENOUS

## 2017-01-22 MED ORDER — SODIUM CHLORIDE 0.9 % IJ SOLN
INTRAMUSCULAR | Status: AC
  Filled 2017-01-22: qty 50

## 2017-01-22 MED ORDER — SIMETHICONE 40 MG/0.6ML PO SUSP
40.0000 mg | Freq: Four times a day (QID) | ORAL | Status: DC | PRN
  Filled 2017-01-22: qty 0.6

## 2017-01-22 MED ORDER — SODIUM CHLORIDE 0.9 % IJ SOLN
INTRAMUSCULAR | Status: DC | PRN
  Administered 2017-01-22: 50 mL

## 2017-01-22 MED ORDER — EPHEDRINE 5 MG/ML INJ
INTRAVENOUS | Status: AC
  Filled 2017-01-22: qty 10

## 2017-01-22 MED ORDER — DEXAMETHASONE SODIUM PHOSPHATE 10 MG/ML IJ SOLN
INTRAMUSCULAR | Status: DC | PRN
  Administered 2017-01-22: 7 mg via INTRAVENOUS

## 2017-01-22 MED ORDER — SUGAMMADEX SODIUM 200 MG/2ML IV SOLN
INTRAVENOUS | Status: DC | PRN
  Administered 2017-01-22: 200 mg via INTRAVENOUS

## 2017-01-22 MED ORDER — DEXAMETHASONE SODIUM PHOSPHATE 4 MG/ML IJ SOLN: 4.0000 mg | INTRAMUSCULAR | Status: DC

## 2017-01-22 MED FILL — LEVOTHYROXINE 150 MCG TAB: 150 | 90 days supply | Qty: 90 | Fill #2

## 2017-01-22 SURGICAL SUPPLY — 83 items
ADH SKN CLS APL DERMABOND .7 (GAUZE/BANDAGES/DRESSINGS)
APL SKNCLS STERI-STRIP NONHPOA (GAUZE/BANDAGES/DRESSINGS) ×3
APL SRG 32X5 SNPLK LF DISP (MISCELLANEOUS)
APPLICATOR COTTON TIP 6IN STRL (MISCELLANEOUS) IMPLANT
APPLIER CLIP ROT 10 11.4 M/L (STAPLE)
APPLIER CLIP ROT 13.4 12 LRG (CLIP)
APR CLP LRG 13.4X12 ROT 20 MLT (CLIP)
APR CLP MED LRG 11.4X10 (STAPLE)
BANDAGE ADH SHEER 1  50/CT (GAUZE/BANDAGES/DRESSINGS) ×18 IMPLANT
BENZOIN TINCTURE PRP APPL 2/3 (GAUZE/BANDAGES/DRESSINGS) ×3 IMPLANT
BLADE SURG SZ11 CARB STEEL (BLADE) ×5 IMPLANT
CABLE HIGH FREQUENCY MONO STRZ (ELECTRODE) ×3 IMPLANT
CHLORAPREP W/TINT 26ML (MISCELLANEOUS) ×7 IMPLANT
CLIP APPLIE ROT 10 11.4 M/L (STAPLE) IMPLANT
CLIP APPLIE ROT 13.4 12 LRG (CLIP) IMPLANT
CLOSURE WOUND 1/2 X4 (GAUZE/BANDAGES/DRESSINGS) ×1
DECANTER SPIKE VIAL GLASS SM (MISCELLANEOUS) ×5 IMPLANT
DERMABOND ADVANCED (GAUZE/BANDAGES/DRESSINGS)
DERMABOND ADVANCED .7 DNX12 (GAUZE/BANDAGES/DRESSINGS) IMPLANT
DEVICE SUT QUICK LOAD TK 5 (STAPLE) ×2 IMPLANT
DEVICE SUT TI-KNOT TK 5X26 (MISCELLANEOUS) ×2 IMPLANT
DEVICE SUTURE ENDOST 10MM (ENDOMECHANICALS) ×3 IMPLANT
DEVICE TI KNOT TK5 (MISCELLANEOUS) ×1
DRAPE UTILITY XL STRL (DRAPES) ×10 IMPLANT
ELECT L-HOOK LAP 45CM DISP (ELECTROSURGICAL)
ELECT PENCIL ROCKER SW 15FT (MISCELLANEOUS) IMPLANT
ELECT REM PT RETURN 15FT ADLT (MISCELLANEOUS) ×5 IMPLANT
ELECTRODE L-HOOK LAP 45CM DISP (ELECTROSURGICAL) IMPLANT
GAUZE SPONGE 2X2 8PLY STRL LF (GAUZE/BANDAGES/DRESSINGS) IMPLANT
GAUZE SPONGE 4X4 12PLY STRL (GAUZE/BANDAGES/DRESSINGS) IMPLANT
GLOVE BIO SURGEON STRL SZ7.5 (GLOVE) ×5 IMPLANT
GLOVE INDICATOR 8.0 STRL GRN (GLOVE) ×5 IMPLANT
GOWN STRL REUS W/TWL XL LVL3 (GOWN DISPOSABLE) ×21 IMPLANT
GRASPER SUT TROCAR 14GX15 (MISCELLANEOUS) ×3 IMPLANT
HOVERMATT SINGLE USE (MISCELLANEOUS) ×5 IMPLANT
KIT BASIN OR (CUSTOM PROCEDURE TRAY) ×5 IMPLANT
LUBRICANT JELLY K Y 4OZ (MISCELLANEOUS) ×3 IMPLANT
MARKER SKIN DUAL TIP RULER LAB (MISCELLANEOUS) ×5 IMPLANT
NDL SPNL 22GX3.5 QUINCKE BK (NEEDLE) ×2 IMPLANT
NEEDLE SPNL 22GX3.5 QUINCKE BK (NEEDLE) ×5 IMPLANT
PACK UNIVERSAL I (CUSTOM PROCEDURE TRAY) ×5 IMPLANT
QUICK LOAD TK 5 (STAPLE) ×1
RELOAD STAPLE 60 3.6 BLU REG (STAPLE) ×2 IMPLANT
RELOAD STAPLE 60 3.8 GOLD REG (STAPLE) ×2 IMPLANT
RELOAD STAPLE 60 4.1 GRN THCK (STAPLE) ×2 IMPLANT
RELOAD STAPLE 60 BLK VRY/THCK (STAPLE) IMPLANT
RELOAD STAPLER 60MM BLK (STAPLE) ×3 IMPLANT
RELOAD STAPLER BLUE 60MM (STAPLE) ×12 IMPLANT
RELOAD STAPLER GOLD 60MM (STAPLE) ×3 IMPLANT
RELOAD STAPLER GREEN 60MM (STAPLE) ×6 IMPLANT
SCISSORS LAP 5X45 EPIX DISP (ENDOMECHANICALS) ×2 IMPLANT
SEALANT SURGICAL APPL DUAL CAN (MISCELLANEOUS) IMPLANT
SHEARS HARMONIC ACE PLUS 45CM (MISCELLANEOUS) ×5 IMPLANT
SLEEVE GASTRECTOMY 40FR VISIGI (MISCELLANEOUS) ×5 IMPLANT
SLEEVE XCEL OPT CAN 5 100 (ENDOMECHANICALS) ×18 IMPLANT
SOLUTION ANTI FOG 6CC (MISCELLANEOUS) ×5 IMPLANT
SPONGE GAUZE 2X2 STER 10/PKG (GAUZE/BANDAGES/DRESSINGS)
SPONGE LAP 18X18 X RAY DECT (DISPOSABLE) ×5 IMPLANT
STAPLER ECHELON BIOABSB 60 FLE (MISCELLANEOUS) ×25 IMPLANT
STAPLER ECHELON LONG 60 440 (INSTRUMENTS) ×3 IMPLANT
STAPLER RELOAD 60MM BLK (STAPLE) ×5
STAPLER RELOAD BLUE 60MM (STAPLE) ×20
STAPLER RELOAD GOLD 60MM (STAPLE) ×5
STAPLER RELOAD GREEN 60MM (STAPLE) ×10
STRIP CLOSURE SKIN 1/2X4 (GAUZE/BANDAGES/DRESSINGS) ×2 IMPLANT
SUT MNCRL AB 4-0 PS2 18 (SUTURE) ×8 IMPLANT
SUT SURGIDAC NAB ES-9 0 48 120 (SUTURE) ×3 IMPLANT
SUT VICRYL 0 TIES 12 18 (SUTURE) ×5 IMPLANT
SYR 10ML ECCENTRIC (SYRINGE) ×5 IMPLANT
SYR 20CC LL (SYRINGE) ×5 IMPLANT
SYR 50ML LL SCALE MARK (SYRINGE) ×5 IMPLANT
SYRINGE 60CC LL (MISCELLANEOUS) ×2 IMPLANT
TOWEL OR 17X26 10 PK STRL BLUE (TOWEL DISPOSABLE) ×5 IMPLANT
TOWEL OR NON WOVEN STRL DISP B (DISPOSABLE) ×5 IMPLANT
TRAY FOLEY W/METER SILVER 16FR (SET/KITS/TRAYS/PACK) IMPLANT
TROCAR BLADELESS 15MM (ENDOMECHANICALS) ×8 IMPLANT
TROCAR BLADELESS OPT 5 100 (ENDOMECHANICALS) ×5 IMPLANT
TUBE CALIBRATION LAPBAND (TUBING) ×3 IMPLANT
TUBING CONNECTING 10 (TUBING) ×8 IMPLANT
TUBING CONNECTING 10' (TUBING) ×3
TUBING ENDO SMARTCAP (MISCELLANEOUS) ×5 IMPLANT
TUBING ENDO SMARTCAP PENTAX (MISCELLANEOUS) ×3 IMPLANT
TUBING INSUF HEATED (TUBING) ×5 IMPLANT

## 2017-01-22 NOTE — H&P (View-Only) (Signed)
Jill Baker 01/18/2017 8:38 AM Location: [facility] Patient #: [phone] DOB: 1980-12-30 Single / Language: Jill Baker / Race: White Female  History of Present Illness Jill Baker [name] MD; 01/18/2017 8:57 AM) The patient is a 36 year old female who presents for a pre-op visit. She comes in today for her preoperative appointment. She is scheduled for laparoscopic sleeve gastrectomy next week. I initially met her in December 2017. Her initial visit weight was 280 pounds. Her only medical changes since she was initially seen was that she in for sleep broke her left shoulder. It did not require surgery. Otherwise she denies any medical changes. She denies any chest pain, chest pressure, shortness of breath, orthopnea, dyspnea on exertion, paroxysmal nocturnal dyspnea, peripheral edema, melena, hematochezia, diarrhea or constipation. She still taking reflux medication on daily basis. If she takes it she has no reflux issues. She denies any dysuria or hematuria. She is still not smoking.  Her upper GI was unremarkable. There is no evidence of stricture or hiatal hernia. Her bariatric evaluation labwork was reviewed. Her TSH was 6 and her medication was subsequently adjusted by her family care physician. Total cholesterol level 211, triglycerides302 LDL 108  She takes Lopressor for tachycardia   Problem List/Past Medical Jill Baker M. Jill Pulling, MD; 01/18/2017 8:57 AM) HISTORY OF PREDIABETES (Z87.898) MORBID OBESITY WITH BMI OF 40.0-44.9, ADULT (E66.01) HYPERTRIGLYCERIDEMIA (E78.1)  Past Surgical History Jill Baker M. Jill Pulling, MD; 01/18/2017 8:57 AM) Cesarean Section - 1 Foot Surgery Bilateral. Gallbladder Surgery - Laparoscopic  Diagnostic Studies History Jill Baker M. Jill Pulling, MD; 01/18/2017 8:57 AM) Colonoscopy 5-10 years ago Mammogram 1-3 years ago Pap Smear 1-5 years ago  Allergies Jill Baker M. Jill Pulling, MD; 01/18/2017 8:57 AM) NexIUM *ULCER DRUGS* Rash.  Medication History Jill Baker M.  Jill Pulling, MD; 01/18/2017 8:57 AM) Medications Reconciled OxyCODONE HCl (5MG/5ML Solution, 5 Milliliter Oral every four hours, as needed, Taken starting 01/18/2017) Active. Zofran ODT (4MG Tablet Disint, 1 (one) Tablet Oral every eight hours, as needed, Taken starting 01/18/2017) Active. Levothyroxine Sodium (112MCG Tablet, Oral) Active. Metoprolol Succinate ER (100MG Tablet ER 24HR, Oral) Active. Omeprazole (40MG Capsule DR, Oral) Active. Sertraline HCl (25MG Tablet, Oral) Active. Ibuprofen (200MG Capsule, Oral as needed) Active.  Social History Jill Baker M. Jill Pulling, MD; 01/18/2017 8:57 AM) Alcohol use Occasional alcohol use. Caffeine use Carbonated beverages. No drug use Tobacco use Never smoker.  Family History Jill Baker M. Jill Pulling, MD; 01/18/2017 8:57 AM) Alcohol Abuse Father. Migraine Headache Mother. Thyroid problems Mother.  Pregnancy / Birth History Jill Baker M. Jill Pulling, MD; 01/18/2017 8:57 AM) Age at menarche 47 years. Gravida 1 Irregular periods Maternal age 49-30 Para 2  Other Problems Jill Baker M. Jill Pulling, MD; 01/18/2017 8:57 AM) GASTROESOPHAGEAL REFLUX DISEASE, ESOPHAGITIS PRESENCE NOT SPECIFIED (K21.9) HYPERCHOLESTEREMIA (E78.00) HYPOTHYROIDISM, ADULT (E03.9)    Vitals (Janette Ranson CMA; 01/18/2017 8:39 AM) 01/18/2017 8:38 AM Weight: 290.8 lb Height: 70in Body Surface Area: 2.45 m Body Mass Index: 41.73 kg/m  Pulse: 76 (Regular)  BP: 130/90 (Sitting, Left Arm, Standard)      Physical Exam Jill Baker M. Zaraya Delauder MD; 01/18/2017 8:54 AM)  General Mental Status-Alert. General Appearance-Consistent with stated age. Hydration-Well hydrated. Voice-Normal. Note: morbidly obese  Head and Neck Head-normocephalic, atraumatic with no lesions or palpable masses. Trachea-midline. Thyroid Gland Characteristics - normal size and consistency.  Eye Eyeball - Bilateral-Extraocular movements intact. Sclera/Conjunctiva - Bilateral-No scleral  icterus.  ENMT Note: normal external ears mouth intact  Chest and Lung Exam Chest and lung exam reveals -quiet, even and easy respiratory effort with no use  of accessory muscles and on auscultation, normal breath sounds, no adventitious sounds and normal vocal resonance. Inspection Chest Wall - Normal. Back - normal.  Breast - Did not examine.  Cardiovascular Cardiovascular examination reveals -normal heart sounds, regular rate and rhythm with no murmurs and normal pedal pulses bilaterally.  Abdomen Inspection Inspection of the abdomen reveals - No Hernias. Skin - Scar - no surgical scars. Palpation/Percussion Palpation and Percussion of the abdomen reveal - Soft, Non Tender, No Rebound tenderness, No Rigidity (guarding) and No hepatosplenomegaly. Auscultation Auscultation of the abdomen reveals - Bowel sounds normal.  Peripheral Vascular Upper Extremity Palpation - Pulses bilaterally normal.  Neurologic Neurologic evaluation reveals -alert and oriented x 3 with no impairment of recent or remote memory. Mental Status-Normal.  Neuropsychiatric The patient's mood and affect are described as -normal. Judgment and Insight-insight is appropriate concerning matters relevant to self.  Musculoskeletal Normal Exam - Left-Upper Extremity Strength Normal and Lower Extremity Strength Normal. Normal Exam - Right-Upper Extremity Strength Normal and Lower Extremity Strength Normal.  Lymphatic Head & Neck  General Head & Neck Lymphatics: Bilateral - Description - Normal. Axillary - Did not examine. Femoral & Inguinal - Did not examine.    Assessment & Plan Jill Baker M. [name] MD; 01/18/2017 8:57 AM)  MORBID OBESITY WITH BMI OF 40.0-44.9, ADULT (E66.01) Impression: We reviewed her preoperative workup. We discussed the results of her EKG, chest x-ray upper GI and blood work. We rediscussed the typical hospitalization and postoperative recovery course. We rediscussed the  typical postoperative issues. She was given her postoperative prescriptions today. We discussed the importance of the preoperative meal plan. All of her questions were asked and answered  Current Plans Pt Education - EMW_preopbariatric Started OxyCODONE HCl 5MG/5ML, 5 Milliliter every four hours, as needed, 100 Milliliter, 01/18/2017, No Refill. Started Zofran ODT 4MG, 1 (one) Tablet every eight hours, as needed, #20, 01/18/2017, No Refill. HYPERCHOLESTEREMIA (E78.00)  GASTROESOPHAGEAL REFLUX DISEASE, ESOPHAGITIS PRESENCE NOT SPECIFIED (K21.9) Impression: With respect to gastroesophageal reflux disease. We discussed that sometimes this can worsen with sleeve gastrectomy. Before we definitively proceed with surgery I need to review her outside upper endoscopy report. We will also need to see what her upper GI looks like. We also discussed the possibility of needing a repeat upper endoscopy.  HYPOTHYROIDISM, ADULT (E03.9)  HYPERTRIGLYCERIDEMIA (E78.1)  [name]. Jill Pulling, MD, FACS General, Bariatric, & Minimally Invasive Surgery [facility], [place]

## 2017-01-22 NOTE — Discharge Instructions (Signed)
GASTRIC BYPASS/SLEEVE  Home Care Instructions   These instructions are to help you care for yourself when you go home.  Call: If you have any problems.  Call [phone] and ask for the surgeon on call  If you need immediate assistance come to the ER at [facility]. Tell the ER staff you are a new post-op gastric bypass or gastric sleeve patient  Signs and symptoms to report:  Severe  vomiting or nausea o If you cannot handle clear liquids for longer than 1 day, call your surgeon  Abdominal pain which does not get better after taking your pain medication  Fever greater than 100.4  F and chills  Heart rate over 100 beats a minute  Trouble breathing  Chest pain  Redness,  swelling, drainage, or foul odor at incision (surgical) sites  If your incisions open or pull apart  Swelling or pain in calf (lower leg)  Diarrhea (Loose bowel movements that happen often), frequent watery, uncontrolled bowel movements  Constipation, (no bowel movements for 3 days) if this happens: o Take Milk of Magnesia, 2 tablespoons by mouth, 3 times a day for 2 days if needed o Stop taking Milk of Magnesia once you have had a bowel movement o Call your doctor if constipation continues Or o Take Miralax  (instead of Milk of Magnesia) following the label instructions o Stop taking Miralax once you have had a bowel movement o Call your doctor if constipation continues  Anything you think is abnormal for you   Normal side effects after surgery:  Unable to sleep at night or unable to concentrate  Irritability  Being tearful (crying) or depressed  These are common complaints, possibly related to your anesthesia, stress of surgery, and change in lifestyle, that usually go away a few weeks after surgery. If these feelings continue, call your medical doctor.  Wound Care: You may have surgical glue, steri-strips, or staples over your incisions after surgery  Surgical glue: Looks like clear  film over your incisions and will wear off a little at a time  Steri-strips: Adhesive strips of tape over your incisions. You may notice a yellowish color on skin under the steri-strips. This is used to make the steri-strips stick better. Do not pull the steri-strips off - let them fall off  Staples: Staples may be removed before you leave the hospital o If you go home with staples, call [facility] for an appointment with your surgeons nurse to have staples removed 10 days after surgery, [phone]  Showering: You may shower two (2) days after your surgery unless your surgeon tells you differently o Wash gently around incisions with warm soapy water, rinse well, and gently pat dry o No tub baths until staples are removed and incisions are healed   Medications:  Medications should be liquid or crushed if larger than the size of a dime  Extended release pills (medication that releases a little bit at a time through the  day) should not be crushed  Depending on the size and number of medications you take, you may need to space (take a few throughout the day)/change the time you take your medications so that you do not over-fill your pouch (smaller stomach)  Make sure you follow-up with you primary care physician to make medication changes needed during rapid weight loss and life -style changes  If you have diabetes, follow up with your doctor that orders your diabetes medication(s) within one week after surgery  and check your blood sugar regularly   Do not drive while taking narcotics (pain medications)   Do not take acetaminophen (Tylenol) and Roxicet or Lortab Elixir at the same time since these pain medications contain acetaminophen   Diet:  First 2 Weeks You will see the nutritionist about two (2) weeks after your surgery. The nutritionist will increase the types of foods you can eat if you are handling liquids well:  If you have severe vomiting or nausea and cannot  handle clear liquids lasting longer than 1 day call your surgeon Protein Shake  Drink at least 2 ounces of shake 5-6 times per day  Each serving of protein shakes (usually 8-12 ounces) should have a minimum of: o 15 grams of protein o And no more than 5 grams of carbohydrate  Goal for protein each day: o Men = 80 grams per day o Women = 60 grams per day     Protein powder may be added to fluids such as non-fat milk or Lactaid milk or Soy milk (limit to 35 grams added protein powder per serving)  Hydration  Slowly increase the amount of water and other clear liquids as tolerated (See Acceptable Fluids)  Slowly increase the amount of protein shake as tolerated  Sip fluids slowly and throughout the day  May use sugar substitutes in small amounts (no more than 6-8 packets per day; i.e. Splenda)  Fluid Goal  The first goal is to drink at least 8 ounces of protein shake/drink per day (or as directed by the nutritionist); some examples of protein shakes are [employer], [employer], EAS Edge HP, and Unjury. - See handout from pre-op Bariatric Education Class: o Slowly increase the amount of protein shake you drink as tolerated o You may find it easier to slowly sip shakes throughout the day o It is important to get your proteins in first  Your fluid goal is to drink 64-100 ounces of fluid daily o It may take a few weeks to build up to this   32 oz. (or more) should be clear liquids And  32 oz. (or more) should be full liquids (see below for examples)  Liquids should not contain sugar, caffeine, or carbonation  Clear Liquids:  Water of Sugar-free flavored water (i.e. Fruit HO, Propel)  Decaffeinated coffee or tea (sugar-free)  Crystal lite, Wylers Lite, Minute Maid Lite  Sugar-free Jell-O  Bouillon or broth  Sugar-free Popsicle:    - Less than 20 calories each; Limit 1 per day  Full Liquids:                   Protein Shakes/Drinks + 2 choices per day of  other full liquids  Full liquids must be: o No More Than 12 grams of Carbs per serving o No More Than 3 grams of Fat per serving  Strained low-fat cream soup  Non-Fat milk  Fat-free Lactaid Milk  Sugar-free yogurt (Dannon Lite & Fit, Greek yogurt)    Vitamins and Minerals  Start 1 day after surgery unless otherwise directed by your surgeon  2 Chewable Bariatric Multivitamin / Multimineral Supplement with iron   Chewable Calcium Citrate with Vitamin D-3 (Example: 3 Chewable Calcium  Plus 600 with Vitamin D-3) o Take 500 mg three (3) times a day for a total of 1500 mg each day o Do not take all 3 doses of calcium at one time as it may cause constipation, and you can only absorb 500 mg at a time  o Do not mix multivitamins containing iron with calcium supplements;  take 2 hours apart  Menstruating women and those at risk for anemia ( a blood disease that causes weakness) may need extra iron o Talk to your doctor to see if you need more iron  If you need extra iron: Total daily Iron recommendation (including Vitamins) is 50 to 100 mg Iron/day  Do not stop taking or change any vitamins or minerals until you talk to your nutritionist or surgeon  Your nutritionist and/or surgeon must approve all vitamin and mineral supplements   Activity and Exercise: It is important to continue walking at home. Limit your physical activity as instructed by your doctor. During this time, use these guidelines:  Do not lift anything greater than ten  (10) pounds for at least two (2) weeks  Do not go back to work or drive until Engineer, production says you can  You may have sex when you feel comfortable o It is VERY important for female patients to use a reliable birth control method; fertility often increase after surgery o Do not get pregnant for at least 18 months  Start exercising as soon as your doctor tells you that you can o Make sure your doctor approves any physical activity  Start with a  simple walking program  Walk 5-15 minutes each day, 7 days per week  Slowly increase until you are walking 30-45 minutes per day  Consider joining our [place] program. [phone] or email [email or web address]   Special Instructions Things to remember:  Use your CPAP when sleeping if this applies to you  Consider buying a medical alert bracelet that says you had lap-band surgery     You will likely have your first fill (fluid added to your band) 6 - 8 weeks after surgery  [facility] has a free Bariatric Surgery Support Group that meets monthly, the 3rd Thursday, [place]. You can see classes online at [email or web address]  It is very important to keep all follow up appointments with your surgeon, nutritionist, primary care physician, and behavioral health practitioner o After the first year, please follow up with your bariatric surgeon and nutritionist at least once a year in order to maintain best weight loss results                    [facility]:  [place]: [phone]               Bariatric Nurse Coordinator: [phone]  Gastric Bypass/Sleeve Home Care Instructions  Rev. 08/2012                                                         Reviewed and Endorsed                                                    by [facility] Patient Education Committee, Jan, 2014

## 2017-01-22 NOTE — Op Note (Signed)
01/22/2017 Leonard Schwartz 1981/05/13 [id number]   PRE-OPERATIVE DIAGNOSIS:     Obesity, Class III, BMI 40   GERD   Hypertriglyceridemia    POST-OPERATIVE DIAGNOSIS:  Same + Fatty liver + hiatal hernia  PROCEDURE:  Procedure(s): LAPAROSCOPIC SLEEVE GASTRECTOMY WITH HIATAL HERNIA REPAIR UPPER GI ENDOSCOPY  SURGEON:  Surgeon(s): Gayland Curry, MD FACS FASMBS  ASSISTANTS: Gurney Maxin, MD; Carlena Hurl PA-C  ANESTHESIA:   general  DRAINS: none   BOUGIE: 11 fr ViSiGi  LOCAL MEDICATIONS USED:  MARCAINE + Exparel  SPECIMEN:  Source of Specimen:  Greater curvature of stomach  DISPOSITION OF SPECIMEN:  PATHOLOGY  COUNTS:  YES  INDICATION FOR PROCEDURE: This is a very pleasant 36 y.o.-year-old morbidly obese female who has had unsuccessful attempts for sustained weight loss. The patient presents today for a planned laparoscopic sleeve gastrectomy with upper endoscopy. We have discussed the risk and benefits of the procedure extensively preoperatively. Please see my separate notes.  PROCEDURE: After obtaining informed consent and receiving 5000 units of subcutaneous heparin, the patient was brought to the operating room at [facility] and placed supine on the operating room table. General endotracheal anesthesia was established. Sequential compression devices were placed. A orogastric tube was placed. The patient's abdomen was prepped and draped in the usual standard surgical fashion. The patient received preoperative IV antibiotic. A surgical timeout was performed.  Access to the abdomen was achieved using a 5 mm 0 laparoscope thru a 5 mm trocar In the left upper Quadrant 2 fingerbreadths below the left subcostal margin using the Optiview technique. Pneumoperitoneum was smoothly established up to 15 mm of mercury. The laparoscope was advanced and the abdominal cavity was surveilled. The patient was then placed in reverse Trendelenburg. There was evidence of a hiatal hernia on  laparoscopy - gap in the left and right crus anteriorly.  A 5 mm trocar was placed slightly above and to the left of the umbilicus under direct visualization.  The [facility] liver retractor was placed under the left lobe of the liver through a 5 mm trocar incision site in the subxiphoid position. A 5 mm trocar was placed in the lateral right upper quadrant along with a 15 mm trocar in the mid right abdomen. A final 5 mm trocar was placed in the lateral LUQ.  All under direct visualization after exparel had been infiltrated in bilateral upper lateral abdominal walls as a TAP block  The stomach was inspected. It was completely decompressed and the orogastric tube was removed.  There is a small anterior dimple that was obviously visible. The calibration tube was placed in the oropharynx and guided down into the stomach by the CRNA. 10 mL of air was insufflated into the calibration balloon. The calibration tubing was then gently pulled back by the CRNA and it slid past the GE junction. At this point the calibration tubing was desufflated and pulled back into the esophagus. This confirmed my suspicion of a clinically significant hiatal hernia. The gastrohepatic ligament was incised with harmonic scalpel. The right crus was identified. We identified the crossing fat along the right crus. The adipose tissue just above this area was incised with harmonic scalpel. I then bluntly dissected out this area and identified the left crus. There was evidence of a hiatal hernia. I then mobilized the esophagus. The left and right crus were further mobilized with blunt dissection. I was then able to reapproximate the left and right crus with 0 Ethibond using an Endostitch suture device  and securing it with a titanium tyknot.  We then had the CRNA readvanced the calibration tubing back into the stomach. 10 mL of air was insufflated into the calibration tube balloon. The calibration tube was then gently pulled back and there was  resistance at the GE junction. The tube did not slide back up into the esophagus. At this point the calibration tubing was deflated and removed from the patient's body.   We identified the pylorus and measured 6 cm proximal to the pylorus and identified an area of where we would start taking down the short gastric vessels. Harmonic scalpel was used to take down the short gastric vessels along the greater curvature of the stomach. We were able to enter the lesser sac. We continued to march along the greater curvature of the stomach taking down the short gastrics. As we approached the gastrosplenic ligament we took care in this area not to injure the spleen. We were able to take down the entire gastrosplenic ligament. We then mobilized the fundus away from the left crus of diaphragm. There were not any significant posterior gastric avascular attachments. This left the stomach completely mobilized. No vessels had been taken down along the lesser curvature of the stomach. It appeared she had a bulky stomach.  We then reidentified the pylorus. A 40Fr ViSiGi was then placed in the oropharynx and advanced down into the stomach and placed in the distal antrum and positioned along the lesser curvature. It was placed under suction which secured the 40Fr ViSiGi in place along the lesser curve. Anesthesia did get additional bilious fluid out when placing the visigi. Then using the Ethicon echelon 60 mm stapler with a black load with Seamguard, I placed a stapler along the antrum approximately 5 cm from the pylorus. The stapler was angled so that there is ample room at the angularis incisura. I then fired the first staple load after inspecting it posteriorly to ensure adequate space both anteriorly and posteriorly. At this point I still was not completely past the angularis so with a green load with Seamguard, I placed the stapler in position just inside the prior stapleline. We then rotated the stomach to insure that there  was adequate anteriorly as well as posteriorly. The stapler was then fired. I used another 20mm green cartridge with seamguard. At this point I started using 60 mm gold load staple cartridge x 1 with Seamguard. The echelon stapler was then repositioned with a 60 mm blue load with Seamguard and we continued to march up along the [place]. My assistant was holding traction along the greater curvature stomach along the cauterized short gastric vessels ensuring that the stomach was symmetrically retracted. Prior to each firing of the staple, we rotated the stomach to ensure that there is adequate stomach left.  As we approached the fundus, I used 60 mm blue cartridge with Seamguard aiming slightly lateral to the esophageal fat pad. Although the staples on this fire had completely gone thru the last part of the stomach it had not completely cut it. Therefore 1 additional 60 blue load was used to free the remaining stomach. The sleeve was inspected. There is no evidence of cork screw. The staple line appeared hemostatic. The CRNA inflated the ViSiGi to the green zone and the upper abdomen was flooded with saline. There were no bubbles. The sleeve was decompressed and the ViSiGi removed. My assistant scrubbed out and performed an upper endoscopy. The sleeve easily distended with air and the scope was easily  advanced to the pylorus. There is no evidence of internal bleeding or cork screwing. There was no narrowing at the angularis. There is no evidence of bubbles. Please see his operative note for further details. The gastric sleeve was decompressed and the endoscope was removed.  The greater curvature the stomach was grasped with a laparoscopic grasper and removed from the 15 mm trocar site.  The liver retractor was removed. I then closed the 15 mm trocar site with 2 interrupted 0 Vicryl sutures through the fascia using the endoclose. The closure was viewed laparoscopically and it was airtight. Remaining Exparel was then  infiltrated in the preperitoneal spaces around the trocar sites. Pneumoperitoneum was released. All trocar sites were closed with a 4-0 Monocryl in a subcuticular fashion followed by the application of benzoin, steri-strips, bandages.  The patient was extubated and taken to the recovery room in stable condition. All needle, instrument, and sponge counts were correct x2. There are no immediate complications  (1) 60 mm black with Seamguard (2) 60 mm green with Seamguard (1) 60 mm gold with seamguard (4) 60 mm blue with  seamguard  PLAN OF CARE: Admit to inpatient   PATIENT DISPOSITION:  PACU - hemodynamically stable.   Delay start of Pharmacological VTE agent (>24hrs) due to surgical blood loss or risk of bleeding:  no  [name]. [name], MD, FACS FASMBS General, Bariatric, & Minimally Invasive Surgery [facility], [place]

## 2017-01-22 NOTE — Anesthesia Preprocedure Evaluation (Signed)
Anesthesia Evaluation  Patient identified by MRN, date of birth, ID band Patient awake    Reviewed: Allergy & Precautions, H&P , Patient's Chart, lab work & pertinent test results  History of Anesthesia Complications (+) PONV and history of anesthetic complications  Airway Mallampati: II  TM Distance: >3 FB Neck ROM: full    Dental no notable dental hx.    Pulmonary shortness of breath,    Pulmonary exam normal breath sounds clear to auscultation       Cardiovascular Exercise Tolerance: Good hypertension,  Rhythm:regular Rate:Normal     Neuro/Psych    GI/Hepatic hiatal hernia, GERD  Medicated,  Endo/Other  Hypothyroidism Morbid obesity  Renal/GU      Musculoskeletal   Abdominal   Peds  Hematology   Anesthesia Other Findings   Reproductive/Obstetrics                             Anesthesia Physical  Anesthesia Plan  ASA: III  Anesthesia Plan: General   Post-op Pain Management:    Induction: Intravenous  PONV Risk Score and Plan: 4 or greater and Ondansetron, Dexamethasone, Propofol, Midazolam and Scopolamine patch - Pre-op  Airway Management Planned:   Additional Equipment:   Intra-op Plan:   Post-operative Plan: Extubation in OR  Informed Consent: I have reviewed the patients History and Physical, chart, labs and discussed the procedure including the risks, benefits and alternatives for the proposed anesthesia with the patient or authorized representative who has indicated his/her understanding and acceptance.   Dental advisory given  Plan Discussed with: CRNA  Anesthesia Plan Comments:         Anesthesia Quick Evaluation

## 2017-01-22 NOTE — Anesthesia Procedure Notes (Signed)
Procedure Name: Intubation Date/Time: 01/22/2017 7:27 AM Performed by: [place] Kassondra Geil C Pre-anesthesia Checklist: Patient identified, Emergency Drugs available, Suction available and Patient being monitored Patient Re-evaluated:Patient Re-evaluated prior to induction Oxygen Delivery Method: Circle system utilized Preoxygenation: Pre-oxygenation with 100% oxygen Induction Type: IV induction Ventilation: Mask ventilation without difficulty Laryngoscope Size: Mac and 3 Grade View: Grade I Tube type: Oral Tube size: 7.0 mm Number of attempts: 1 Airway Equipment and Method: Stylet and Oral airway Placement Confirmation: ETT inserted through vocal cords under direct vision,  positive ETCO2 and breath sounds checked- equal and bilateral Tube secured with: Tape Dental Injury: Teeth and Oropharynx as per pre-operative assessment

## 2017-01-22 NOTE — Interval H&P Note (Signed)
History and Physical Interval Note:  01/22/2017 7:14 AM  Jill Baker  has presented today for surgery, with the diagnosis of MORBID OBESITY  The various methods of treatment have been discussed with the patient and family. After consideration of risks, benefits and other options for treatment, the patient has consented to  Procedure(s): LAPAROSCOPIC GASTRIC SLEEVE RESECTION (N/A) as a surgical intervention .  The patient's history has been reviewed, patient examined, no change in status, stable for surgery.  I have reviewed the patient's chart and labs.  Questions were answered to the patient's satisfaction.    [name]. [name], MD, [place], Bariatric, & Minimally Invasive Surgery [facility], [place]  [facility] M

## 2017-01-22 NOTE — Progress Notes (Signed)
In to visit with patient post op.  One episode of 75 cc brown emesis.  Patient had received IV Zofran before this episode.  Patient reports difficulty with nausea with any anesthesia.  Promethazine 12.5 given to patient by bedside RN.

## 2017-01-22 NOTE — Transfer of Care (Signed)
Immediate Anesthesia Transfer of Care Note  Patient: Jill Baker  Procedure(s) Performed: Procedure(s): LAPAROSCOPIC GASTRIC SLEEVE RESECTION WITH HIATAL HERNIA REPAIR (N/A) UPPER GI ENDOSCOPY  Patient Location: PACU  Anesthesia Type:General  Level of Consciousness: awake, alert  and oriented  Airway & Oxygen Therapy: Patient Spontanous Breathing and Patient connected to face mask oxygen  Post-op Assessment: Report given to RN and Post -op Vital signs reviewed and stable  Post vital signs: Reviewed and stable  Last Vitals:  Vitals:   01/22/17 0523  BP: 120/78  Pulse: 87  Resp: 18  Temp: 36.5 C    Last Pain:  Vitals:   01/22/17 0534  TempSrc:   PainSc: 0-No pain      Patients Stated Pain Goal: 3 (34/91/79 1505)  Complications: No apparent anesthesia complications

## 2017-01-23 LAB — COMPREHENSIVE METABOLIC PANEL
ALT: 81 U/L — ABNORMAL HIGH (ref 14–54)
AST: 64 U/L — ABNORMAL HIGH (ref 15–41)
Albumin: 3.5 g/dL (ref 3.5–5.0)
Alkaline Phosphatase: 53 U/L (ref 38–126)
Anion gap: 7 (ref 5–15)
BILIRUBIN TOTAL: 0.6 mg/dL (ref 0.3–1.2)
BUN: 9 mg/dL (ref 6–20)
CHLORIDE: 106 mmol/L (ref 101–111)
CO2: 25 mmol/L (ref 22–32)
CREATININE: 1 mg/dL (ref 0.44–1.00)
Calcium: 8.5 mg/dL — ABNORMAL LOW (ref 8.9–10.3)
Glucose, Bld: 141 mg/dL — ABNORMAL HIGH (ref 65–99)
POTASSIUM: 4.8 mmol/L (ref 3.5–5.1)
Sodium: 138 mmol/L (ref 135–145)
TOTAL PROTEIN: 6.6 g/dL (ref 6.5–8.1)

## 2017-01-23 LAB — CBC WITH DIFFERENTIAL/PLATELET
Basophils Absolute: 0 10*3/uL (ref 0.0–0.1)
Basophils Relative: 0 %
EOS PCT: 0 %
Eosinophils Absolute: 0 10*3/uL (ref 0.0–0.7)
HEMATOCRIT: 40.5 % (ref 36.0–46.0)
Hemoglobin: 13.9 g/dL (ref 12.0–15.0)
LYMPHS ABS: 1.5 10*3/uL (ref 0.7–4.0)
LYMPHS PCT: 12 %
MCH: 29.8 pg (ref 26.0–34.0)
MCHC: 34.3 g/dL (ref 30.0–36.0)
MCV: 86.9 fL (ref 78.0–100.0)
MONO ABS: 1.4 10*3/uL — AB (ref 0.1–1.0)
MONOS PCT: 11 %
NEUTROS ABS: 9.9 10*3/uL — AB (ref 1.7–7.7)
Neutrophils Relative %: 77 %
PLATELETS: 210 10*3/uL (ref 150–400)
RBC: 4.66 MIL/uL (ref 3.87–5.11)
RDW: 14.2 % (ref 11.5–15.5)
WBC: 12.8 10*3/uL — ABNORMAL HIGH (ref 4.0–10.5)

## 2017-01-23 MED ORDER — PROMETHAZINE HCL 25 MG/ML IJ SOLN
12.5000 mg | Freq: Four times a day (QID) | INTRAMUSCULAR | Status: DC | PRN
  Administered 2017-01-23 – 2017-01-24 (×2): 12.5 mg via INTRAVENOUS
  Filled 2017-01-23 (×2): qty 1

## 2017-01-23 NOTE — Progress Notes (Signed)
12 ounces of clear fluids completed by patient.  Intermittent nausea controlled with IV antiemetics.  Patient would like to try protein shakes.  We discussed drinking slowly with small sips and to stop protein with any increased nausea.

## 2017-01-23 NOTE — Anesthesia Postprocedure Evaluation (Signed)
Anesthesia Post Note  Patient: Jill Baker  Procedure(s) Performed: Procedure(s) (LRB): LAPAROSCOPIC GASTRIC SLEEVE RESECTION WITH HIATAL HERNIA REPAIR (N/A) UPPER GI ENDOSCOPY     Patient location during evaluation: PACU Anesthesia Type: General Level of consciousness: sedated and patient cooperative Pain management: pain level controlled Vital Signs Assessment: post-procedure vital signs reviewed and stable Respiratory status: spontaneous breathing Cardiovascular status: stable Anesthetic complications: no    Last Vitals:  Vitals:   01/23/17 0134 01/23/17 0622  BP: (!) 106/58 120/68  Pulse: 91 73  Resp: 18 16  Temp: 37.3 C 37.2 C    Last Pain:  Vitals:   01/23/17 0629  TempSrc:   PainSc: [name]

## 2017-01-23 NOTE — Progress Notes (Addendum)
1550 Patient asked to try chicken soup..  She has no nausea at this time.  Medicated for pain by bedside RN.  Mother at bedside.  No questions

## 2017-01-23 NOTE — Consult Note (Signed)
   [facility] CM Inpatient Consult   01/23/2017  RENEA SCHOONMAKER 11-20-1980 [id number]     Came to visit Mrs. Detore on behalf of [employer] for [employer] employees/dependents with [facility] insurance.   Denies any current [employer] needs. However, writer was able to provide requested numbers for hospital indemnity plan and short term disability.   Provided [employer] and contact information. Discussed post discharge call. Confirmed best number as [phone].   Marthenia Rolling, MSN-Ed, RN,BSN [employer] [facility] [employer] Liaison [phone]

## 2017-01-23 NOTE — Progress Notes (Signed)
Vomited small amt yellow liquid. stastes becomes nauseated if moves around. prnfor nausea given

## 2017-01-23 NOTE — Progress Notes (Signed)
Patient alert and oriented, Post op day 1.  Provided support and encouragement.  Encouraged pulmonary toilet, ambulation and small sips of liquids.  Patient continues to have intermittent nausea requiring IV Promethazine/Zofran.  Has tolerated 8 ounces of clear fluids.  All questions answered.  Will continue to monitor.

## 2017-01-23 NOTE — Progress Notes (Signed)
Continues to have small amts emesis despite zofran and phenergan. MD aware. Drank approximately 1 oz protein shake and vomited approximately 30 cc brown liquid.

## 2017-01-23 NOTE — Progress Notes (Signed)
Patient vomiting, 50 cc brown fluid.  Instructed patient/bedside RN only ice at this time.

## 2017-01-23 NOTE — Progress Notes (Signed)
Nutrition Education Note  Received consult for diet education per DROP protocol.   Discussed 2 week post op diet with pt. Emphasized that liquids must be non carbonated, non caffeinated, and sugar free. Fluid goals discussed. Pt to follow up with outpatient bariatric RD for further diet progression after 2 weeks. Multivitamins and minerals also reviewed. Teach back method used, pt expressed understanding, expect good compliance.  36 y/o female s/p laparoscopic sleeve gastrectomy. Pt reports nausea and vomiting this morning. Pt has been able to keep down some ice chips but has not yet started protein supplements. Pt has already purchased her vitamins and has appointment for follow up in two weeks.   Diet: First 2 Weeks  You will see the nutritionist about two (2) weeks after your surgery. The nutritionist will increase the types of foods you can eat if you are handling liquids well:  If you have severe vomiting or nausea and cannot handle clear liquids lasting longer than 1 day, call your surgeon  Protein Shake  Drink at least 2 ounces of shake 5-6 times per day  Each serving of protein shakes (usually 8 - 12 ounces) should have a minimum of:  15 grams of protein  And no more than 5 grams of carbohydrate  Goal for protein each day:  Men = 80 grams per day  Women = 60 grams per day  Protein powder may be added to fluids such as non-fat milk or Lactaid milk or Soy milk (limit to 35 grams added protein powder per serving)   Hydration  Slowly increase the amount of water and other clear liquids as tolerated (See Acceptable Fluids)  Slowly increase the amount of protein shake as tolerated  Sip fluids slowly and throughout the day  May use sugar substitutes in small amounts (no more than 6 - 8 packets per day; i.e. Splenda)   Fluid Goal  The first goal is to drink at least 8 ounces of protein shake/drink per day (or as directed by the nutritionist); some examples of protein shakes are Premier  Protein, [employer], [employer], EAS Edge HP, and Unjury. See handout from pre-op Bariatric Education Class:  Slowly increase the amount of protein shake you drink as tolerated  You may find it easier to slowly sip shakes throughout the day  It is important to get your proteins in first  Your fluid goal is to drink 64 - 100 ounces of fluid daily  It may take a few weeks to build up to this  32 oz (or more) should be clear liquids  And  32 oz (or more) should be full liquids (see below for examples)  Liquids should not contain sugar, caffeine, or carbonation   Clear Liquids:  Water or Sugar-free flavored water (i.e. Fruit H2O, Propel)  Decaffeinated coffee or tea (sugar-free)  Crystal Lite, Wyler's Lite, Minute Maid Lite  Sugar-free Jell-O  Bouillon or broth  Sugar-free Popsicle: *Less than 20 calories each; Limit 1 per day   Full Liquids:  Protein Shakes/Drinks + 2 choices per day of other full liquids  Full liquids must be:  No More Than 12 grams of Carbs per serving  No More Than 3 grams of Fat per serving  Strained low-fat cream soup  Non-Fat milk  Fat-free Lactaid Milk  Sugar-free yogurt (Dannon Lite & Fit, [place] yogurt, Oikos Zero)   Koleen Distance MS, RD, LDN Pager #[phone] After Hours Pager: [phone]

## 2017-01-23 NOTE — Op Note (Signed)
Preoperative diagnosis: laparoscopic sleeve gastrectomy  Postoperative diagnosis: Same   Procedure: Upper endoscopy   Surgeon: Gurney Maxin, M.D.  Anesthesia: Gen.   Indications for procedure: This patient was undergoing a laparoscopic sleeve gastrectomy.   Description of procedure: The endoscopy was placed in the mouth, which required multiple attempts and anesthesia assistance with DL, and into the oropharynx and under endoscopic vision it was advanced to the esophagogastric junction. The pouch was insufflated and no bleeding or bubbles were seen. The GEJ was identified at 40cm from the teeth. There was a moderate amount of food particles within the stomach and esophagus, some of which was removed. No bleeding or leaks were detected. The scope was withdrawn without difficulty.   Gurney Maxin, M.D. General, Bariatric, & Minimally Invasive Surgery [facility], [place]

## 2017-01-23 NOTE — Progress Notes (Signed)
Patient ID: Jill Baker, female   DOB: [birth year], 36 y.o.   MRN: [id number]   Progress Note: Metabolic and Bariatric Surgery Service   Chief Complaint/Subjective: Had 2 episodes of emesis since surgery. Some upper abd discomfort. Nausea improved. Ambulated multiple times.  Objective: Vital signs in last 24 hours: Temp:  [97.8 F (36.6 C)-99.1 F (37.3 C)] 98.9 F (37.2 C) (07/24 0622) Pulse Rate:  [73-98] 73 (07/24 0622) Resp:  [16-20] 16 (07/24 0622) BP: (106-138)/(58-94) 120/68 (07/24 0622) SpO2:  [92 %-99 %] 95 % (07/24 0622) Weight:  [132.9 kg (292 lb 15.9 oz)] 132.9 kg (292 lb 15.9 oz) (07/24 0631) Last BM Date: 01/22/17 (before sx)  Intake/Output from previous day: 07/23 0701 - 07/24 0700 In: 3777.5 [P.O.:240; I.V.:3437.5] Out: 1820 [Urine:1725; Emesis/NG output:75; Blood:20] Intake/Output this shift: No intake/output data recorded.  Lungs: cta b/l; regular  Cardiovascular: reg  Abd: soft, min TTP, incisions c/d/i, obese  Extremities: no edema, +SCDs  Neuro: alert, nonfocal  Lab Results: CBC   Recent Labs  01/22/17 1015 01/23/17 0507  WBC  --  12.8*  HGB 14.8 13.9  HCT 43.7 40.5  PLT  --  210   BMET  Recent Labs  01/23/17 0507  NA 138  K 4.8  CL 106  CO2 25  GLUCOSE 141*  BUN 9  CREATININE 1.00  CALCIUM 8.5*   PT/INR No results for input(s): LABPROT, INR in the last 72 hours. ABG No results for input(s): PHART, HCO3 in the last 72 hours.  Invalid input(s): PCO2, PO2  Studies/Results:  Anti-infectives: Anti-infectives    Start     Dose/Rate Route Frequency Ordered Stop   01/22/17 0640  cefoTEtan in Dextrose 5% (CEFOTAN) 2-2.08 GM-% IVPB    Comments:  [place]  : cabinet override      01/22/17 0640 01/22/17 0730   01/22/17 0518  cefoTEtan in Dextrose 5% (CEFOTAN) IVPB 2 g     2 g Intravenous On call to O.R. 01/22/17 0518 01/22/17 0750      Medications: Scheduled Meds: . enoxaparin (LOVENOX) injection  30 mg  Subcutaneous Q12H  . metoprolol tartrate  5 mg Intravenous Q6H  . pantoprazole (PROTONIX) IV  40 mg Intravenous QHS  . [START ON 01/24/2017] protein supplement shake  2 oz Oral Q2H   Continuous Infusions: . dextrose 5 % and 0.45 % NaCl with KCl 20 mEq/L 1,000 mL (01/23/17 0340)   PRN Meds:.oxyCODONE **AND** acetaminophen, acetaminophen, diphenhydrAMINE, morphine injection, ondansetron (ZOFRAN) IV, promethazine, simethicone  Assessment/Plan: Patient Active Problem List   Diagnosis Date Noted  . Obesity, Class III, BMI 40-49.9 (morbid obesity) ([place]) 01/22/2017  . Hypertriglyceridemia 01/22/2017  . Fatty liver 01/22/2017  . Morbid obesity ([place]) 01/22/2017  . Hand, foot and mouth disease 09/24/2014  . Postpartum care following cesarean delivery (2/14) twins 08/17/2012  . Twin pregnancy, delivered by cesarean section, current hospitalization 08/17/2012  . Dichorionic diamniotic twin gestation 08/16/2012  . Placenta previa with hemorrhage 05/22/2012  . Twin pregnancy, twins dichorionic and diamniotic 05/22/2012  . Cervical adenopathy   . DYSPNEA ON EXERTION 10/11/2009  . SHORTNESS OF BREATH 09/03/2009  . PALPITATIONS 09/02/2009  . ESOPHAGEAL STRICTURE 07/21/2009  . DIVERTICULOSIS-COLON 07/21/2009  . IRRITABLE BOWEL SYNDROME 07/21/2009  . VITAMIN B12 DEFICIENCY 07/20/2009  . SKIN RASH 07/08/2009  . EDEMA 07/08/2009  . RECTAL BLEEDING 05/21/2009  . DIARRHEA 05/21/2009  . ABDOMINAL PAIN-EPIGASTRIC 05/21/2009  . ABDOMINAL PAIN -GENERALIZED 05/21/2009  . GERD 03/28/2007  . TACHYCARDIA 03/28/2007  .  Hypothyroidism 02/28/2007   s/p Procedure(s): LAPAROSCOPIC GASTRIC SLEEVE RESECTION WITH HIATAL HERNIA REPAIR UPPER GI ENDOSCOPY 01/22/2017  Explained that sometimes n/v happens.  O/w looks ok. No fever.  Cont water as tolerated Cont antiemetics Ambulate, cont chemical vte prophylaxis Will see how does with oral intake throughout day.   Disposition:  LOS: 1 day  The patient will be  in the hospital for normal postop protocol  Gayland Curry, MD [phone] [facility]

## 2017-01-24 ENCOUNTER — Encounter (HOSPITAL_COMMUNITY): Payer: Self-pay | Admitting: General Surgery

## 2017-01-24 LAB — CBC WITH DIFFERENTIAL/PLATELET
BASOS PCT: 0 %
Basophils Absolute: 0 10*3/uL (ref 0.0–0.1)
EOS ABS: 0.2 10*3/uL (ref 0.0–0.7)
Eosinophils Relative: 2 %
HCT: 43 % (ref 36.0–46.0)
HEMOGLOBIN: 14.8 g/dL (ref 12.0–15.0)
Lymphocytes Relative: 18 %
Lymphs Abs: 1.7 10*3/uL (ref 0.7–4.0)
MCH: 29 pg (ref 26.0–34.0)
MCHC: 34.4 g/dL (ref 30.0–36.0)
MCV: 84.1 fL (ref 78.0–100.0)
MONOS PCT: 10 %
Monocytes Absolute: 0.9 10*3/uL (ref 0.1–1.0)
NEUTROS PCT: 70 %
Neutro Abs: 6.3 10*3/uL (ref 1.7–7.7)
Platelets: 195 10*3/uL (ref 150–400)
RBC: 5.11 MIL/uL (ref 3.87–5.11)
RDW: 13.9 % (ref 11.5–15.5)
WBC: 9.1 10*3/uL (ref 4.0–10.5)

## 2017-01-24 MED ORDER — ONDANSETRON 4 MG PO TBDP
4.0000 mg | ORAL_TABLET | Freq: Once | ORAL | Status: AC
  Administered 2017-01-24: 4 mg via ORAL
  Filled 2017-01-24: qty 1

## 2017-01-24 MED ORDER — OXYCODONE HCL 5 MG/5ML PO SOLN: 5.0000 mg | ORAL | 0 refills | Status: DC | PRN

## 2017-01-24 NOTE — Progress Notes (Signed)
Pt's IV was infiltrated. Removed it and placed IV consult per protocol. Paged Dr. [name] to inquire if he wants another IV placed.   Dr. [name] returned call. Discussed above. VO to leave IV out for now and see how pt does. If she continues to do well, no need to place IV. If she stays another day, place an IV.   DC'd IV consult and informed pt of plan. She was agreeable.

## 2017-01-24 NOTE — Progress Notes (Signed)
Patient alert and oriented, Post op day 2.  Provided support and encouragement.  Nausea better, no emesis.  Completed clear fluid volumes, started on protein shake.  Educated on what full feels like for patient and encouraged to start slow with protein.  Encouraged pulmonary toilet, and ambulation  All questions answered.  Will continue to monitor.

## 2017-01-24 NOTE — Progress Notes (Signed)
Pt requesting oral Zofran to be administered before she goes home since she no longer has IV access. Discussed with Dr. Kieth Brightly, who was currently on floor. He gave the verbal ok for 4mg  PO Zofran for 1 administration. Order placed.

## 2017-01-24 NOTE — Progress Notes (Signed)
Patient alert and oriented, pain is controlled. Patient is tolerating fluids, advanced to protein shake today, patient is tolerating well.  Reviewed Gastric sleeve discharge instructions with patient and patient is able to articulate understanding.  Provided information on BELT program, Support Group and [facility] outpatient pharmacy. All questions answered, will continue to monitor.  

## 2017-01-25 NOTE — Discharge Summary (Signed)
Physician Discharge Summary  Jill Baker NID:[id number] DOB: 1980/08/20 DOA: 01/22/2017  PCP: Laurey Morale, MD  Admit date: 01/22/2017 Discharge date: 01/24/2017  Recommendations for Outpatient Follow-up:  1.   Follow-up Information    Greer Pickerel, MD. Go on 02/07/2017.   Specialty:  General Surgery Why:  at [phone] information: [address] [place] [address] [phone]        Greer Pickerel, MD Follow up.   Specialty:  General Surgery Contact information: [place] [address] [phone]          Discharge Diagnoses:  Principal Problem:   Obesity, Class III, BMI 40-49.9 (morbid obesity) ([place]) Active Problems:   GERD   Hypertriglyceridemia   Fatty liver   Morbid obesity ([place])   Surgical Procedure: Laparoscopic Sleeve Gastrectomy with hiatal hernia repair, upper endoscopy  Discharge Condition: Good Disposition: Home  Diet recommendation: Postoperative sleeve gastrectomy diet (liquids only)  Filed Weights   01/22/17 0523 01/23/17 0631  Weight: 131.3 kg (289 lb 6 oz) 132.9 kg (292 lb 15.9 oz)     Hospital Course:  The patient was admitted for a planned laparoscopic sleeve gastrectomy. Please see operative note. She was found to have an incidental hiatal hernia which was repaired. Preoperatively the patient was given 5000 units of subcutaneous heparin for DVT prophylaxis. Postoperative prophylactic Lovenox dosing was started on the morning of postoperative day 1. On the evening of postoperative day 0, the patient was started on water and ice chips. On postoperative day 1 the patient had no fever or tachycardia and was tolerating water in their diet was gradually advanced throughout the day. However she has had several episodes of small amounts of emesis and did not tolerate protein shakes so she was found not stable for discharge with respect to oral intake requirements. The patient was ambulating without difficulty.  On postoperative day 2 Their vital signs are stable without fever or tachycardia. Their hemoglobin had remained stable. Her nausea had significantly improved and was tolerating protein shakes and water. She had minimal discomfort. She met oral intake requirements for discharge. The patient had received discharge instructions and counseling. They were deemed stable for discharge and had met discharge criteria  BP 113/72 (BP Location: Right Arm)   Pulse 92   Temp 98.2 F (36.8 C) (Oral)   Resp 19   Ht 5' 11"  (1.803 m)   Wt 132.9 kg (292 lb 15.9 oz)   SpO2 96%   BMI 40.86 kg/m   Gen: alert, NAD, non-toxic appearing Pupils: equal, no scleral icterus Pulm: Lungs clear to auscultation, symmetric chest rise CV: regular rate and rhythm Abd: soft, min tender, nondistended. Incisions c/d/i.  No cellulitis. No incisional hernia Ext: no edema, no calf tenderness Skin: no rash, no jaundice  Discharge Instructions  Discharge Instructions    AMB Referral to [facility]    Complete by:  As directed    Please assign UMR member for post discharge call. Currently at [facility]. Thanks. Marthenia Rolling, [employer], [facility] [phone]   Reason for consult:  Please assign UMR member for post discharge call   Expected date of contact:  1-3 days (reserved for hospital discharges)   Ambulate hourly while awake    Complete by:  As directed    Call MD for:  difficulty breathing, headache or visual disturbances    Complete by:  As directed    Call MD for:  persistant  dizziness or light-headedness    Complete by:  As directed    Call MD for:  persistant nausea and vomiting    Complete by:  As directed    Call MD for:  redness, tenderness, or signs of infection (pain, swelling, redness, odor or green/yellow discharge around incision site)    Complete by:  As directed    Call MD for:  severe uncontrolled pain    Complete by:  As directed    Call MD for:  temperature  >101 F    Complete by:  As directed    Diet bariatric full liquid    Complete by:  As directed    Discharge instructions    Complete by:  As directed    See bariatric discharge instructions   Incentive spirometry    Complete by:  As directed    Perform hourly while awake     Allergies as of 01/24/2017      Reactions   Nexium [esomeprazole Magnesium] Rash   NEXIUM BRAND NAME ONLY      Medication List    STOP taking these medications   azithromycin 250 MG tablet Commonly known as:  ZITHROMAX Z-PAK   benzonatate 100 MG capsule Commonly known as:  TESSALON PERLES   predniSONE 5 MG tablet Commonly known as:  DELTASONE     TAKE these medications   cetirizine 10 MG tablet Commonly known as:  ZYRTEC Take 10 mg by mouth daily.   ibuprofen 200 MG tablet Commonly known as:  ADVIL,MOTRIN Take 600 mg by mouth every 8 (eight) hours as needed for headache. What changed:  Another medication with the same name was removed. Continue taking this medication, and follow the directions you see here.   levothyroxine 150 MCG tablet Commonly known as:  SYNTHROID, LEVOTHROID Take 1 tablet (150 mcg total) by mouth daily.   metoprolol succinate 100 MG 24 hr tablet Commonly known as:  TOPROL-XL TAKE 1 TABLET (100 MG TOTAL) BY MOUTH DAILY. TAKE WITH OR IMMEDIATELY FOLLOWING A MEAL. Notes to patient:  Monitor Blood Pressure Daily and keep a log for primary care physician.  You may need to make changes to your medications with rapid weight loss.     omeprazole 40 MG capsule Commonly known as:  PRILOSEC TAKE 1 CAPSULE (40 MG TOTAL) BY MOUTH DAILY.   oxyCODONE 5 MG/5ML solution Commonly known as:  ROXICODONE Take 5-10 mLs (5-10 mg total) by mouth every 4 (four) hours as needed for moderate pain or severe pain.   sertraline 25 MG tablet Commonly known as:  ZOLOFT Take 25 mg by mouth at bedtime.      Follow-up Information    Greer Pickerel, MD. Go on 02/07/2017.   Specialty:  General  Surgery Why:  at [address] information: [address] [place] [address] [phone]        Greer Pickerel, MD Follow up.   Specialty:  General Surgery Contact information: [place] [address] [phone]            The results of significant diagnostics from this hospitalization (including imaging, microbiology, ancillary and laboratory) are listed below for reference.    Significant Diagnostic Studies: No results found.  Labs: Basic Metabolic Panel:  Recent Labs Lab 01/23/17 0507  NA 138  K 4.8  CL 106  CO2 25  GLUCOSE 141*  BUN 9  CREATININE 1.00  CALCIUM 8.5*   Liver Function Tests:  Recent Labs  Lab 01/23/17 0507  AST 64*  ALT 81*  ALKPHOS 53  BILITOT 0.6  PROT 6.6  ALBUMIN 3.5    CBC:  Recent Labs Lab 01/22/17 1015 01/23/17 0507 01/24/17 0519  WBC  --  12.8* 9.1  NEUTROABS  --  9.9* 6.3  HGB 14.8 13.9 14.8  HCT 43.7 40.5 43.0  MCV  --  86.9 84.1  PLT  --  210 195    CBG: No results for input(s): GLUCAP in the last 168 hours.  Principal Problem:   Obesity, Class III, BMI 40-49.9 (morbid obesity) ([place]) Active Problems:   GERD   Hypertriglyceridemia   Fatty liver   Morbid obesity ([place])   Time coordinating discharge: 20 min  Signed:  Gayland Curry, MD [facility], [place] [phone] 01/25/2017, 7:27 AM

## 2017-01-29 ENCOUNTER — Other Ambulatory Visit: Payer: Self-pay | Admitting: *Deleted

## 2017-01-29 ENCOUNTER — Encounter: Payer: Self-pay | Admitting: *Deleted

## 2017-01-29 NOTE — Patient Outreach (Signed)
[facility]) Care Management  01/29/2017  Jill Baker [birth year] [id number]   Subjective: Telephone call to patient's home number, spoke with patient, and HIPAA verified.  Discussed [facility] Care Management UMR Transition of care follow up, patient voiced understanding, and is in agreement to follow up.   Patient states she is doing well and has scheduled follow up appointment with surgeon on 02/07/17.  Patient voices understanding of medical diagnosis and treatment plan.  States she is accessing the following [facility] benefits: outpatient pharmacy, hospital indemnity, short term disability, and has family medical leave act (FMLA) in place.  Patient states she does not have any education material, transition of care, care coordination, disease management, disease monitoring, transportation, community resource, or pharmacy needs at this time. States she is very appreciative of the follow up and is in agreement to receive [facility] Management information.    Objective: Per chart review, patient hospitalized  01/22/17 - 01/24/17 for obesity.  Status post laparoscopic sleeve gastrectomy on 01/23/17.  Patient also has a history of: HYPERTRIGLYCERIDEMIA, PREDIABETES, and fatty liver.    Assessment: Received UMR Transition of care referral on 01/23/17.   Transition of care follow up completed, no care management needs, and will proceed with case closure.    Plan: RNCM will send patient successful outreach letter, [facility] pamphlet, and magnet. RNCM will send case closure due to follow up completed / no care management needs request to [facility].    Juanell Saffo H. Annia Friendly, BSN, [facility] Telephonic CM Phone: [phone] Fax: [phone]

## 2017-02-01 ENCOUNTER — Telehealth (HOSPITAL_COMMUNITY): Payer: Self-pay

## 2017-02-01 NOTE — Telephone Encounter (Signed)
Made discharge phone call to patient. Asking the following questions.    1. Do you have someone to care for you now that you are home?  yes 2. Are you having pain now that is not relieved by your pain medication?  yes 3. Are you able to drink the recommended daily amount of fluids (48 ounces minimum/day) and protein (60-80 grams/day) as prescribed by the dietitian or nutritional counselor?  60 grams of protein, and 42 ounces of fluid 4. Are you taking the vitamins and minerals as prescribed?  yes 5. Do you have the "on call" number to contact your surgeon if you have a problem or question?  yes 6. Are your incisions free of redness, swelling or drainage? (If steri strips, address that these can fall off, shower as tolerated) yes had rash talked to office 7. Have your bowels moved since your surgery?  If not, are you passing gas?  yes 8. Are you up and walking 3-4 times per day?  yes 9. Were you provided your discharge medications before your surgery or before you were discharged from the hospital and are you taking them without problem?  yes

## 2017-02-06 ENCOUNTER — Encounter: Payer: Self-pay | Admitting: Skilled Nursing Facility1

## 2017-02-06 ENCOUNTER — Encounter: Payer: 59 | Attending: General Surgery | Admitting: Skilled Nursing Facility1

## 2017-02-06 DIAGNOSIS — Z811 Family history of alcohol abuse and dependence: Secondary | ICD-10-CM | POA: Diagnosis not present

## 2017-02-06 DIAGNOSIS — Z8349 Family history of other endocrine, nutritional and metabolic diseases: Secondary | ICD-10-CM | POA: Diagnosis not present

## 2017-02-06 DIAGNOSIS — K219 Gastro-esophageal reflux disease without esophagitis: Secondary | ICD-10-CM | POA: Diagnosis not present

## 2017-02-06 DIAGNOSIS — Z79899 Other long term (current) drug therapy: Secondary | ICD-10-CM | POA: Insufficient documentation

## 2017-02-06 DIAGNOSIS — E7800 Pure hypercholesterolemia, unspecified: Secondary | ICD-10-CM | POA: Diagnosis not present

## 2017-02-06 DIAGNOSIS — E079 Disorder of thyroid, unspecified: Secondary | ICD-10-CM | POA: Diagnosis not present

## 2017-02-06 DIAGNOSIS — Z888 Allergy status to other drugs, medicaments and biological substances status: Secondary | ICD-10-CM | POA: Diagnosis not present

## 2017-02-06 DIAGNOSIS — Z9889 Other specified postprocedural states: Secondary | ICD-10-CM | POA: Diagnosis not present

## 2017-02-06 DIAGNOSIS — Z713 Dietary counseling and surveillance: Secondary | ICD-10-CM | POA: Diagnosis not present

## 2017-02-06 NOTE — Progress Notes (Signed)
Bariatric Class:  Appt start time: 1530 end time:  1630.  2 Week Post-Operative Nutrition Class  Patient was seen on 02/06/2017 for Post[facility]ative [facility]   Surgery date: 01/22/2017 Surgery type: sleeve [facility] at [facility]: 287.8 Weight today: 271  TANITA  BODY COMP RESULTS  02/06/2017   BMI (kg/m^2) 37.8   Fat Mass (lbs) 135.6   Fat Free Mass (lbs) 135.4   Total Body Water (lbs) 100.6   The following the learning objectives were met by the patient during this course:  Identifies Phase 3A (Soft, High Proteins) Dietary Goals and will begin from 2 weeks post-operatively to 2 months post-operatively  Identifies appropriate sources of fluids and proteins   States protein recommendations and appropriate sources post-operatively  Identifies the need for appropriate texture modifications, mastication, and bite sizes when consuming solids  Identifies appropriate multivitamin and calcium sources post-operatively  Describes the need for physical activity post-operatively and will follow MD recommendations  States when to call healthcare provider regarding medication questions or post-operative complications  Handouts given during class include:  Phase 3A: Soft, High Protein Diet Handout  Follow-Up Plan: Patient will follow-up at [facility] in 6 weeks for 2 month post-op nutrition visit for diet advancement per MD.

## 2017-02-23 MED FILL — METOPROLOL SUCC ER 100 MG T: 100 | 90 days supply | Qty: 90 | Fill #1

## 2017-03-03 ENCOUNTER — Telehealth: Payer: 59 | Admitting: Family

## 2017-03-03 DIAGNOSIS — H109 Unspecified conjunctivitis: Secondary | ICD-10-CM

## 2017-03-03 MED ORDER — POLYMYXIN B-TRIMETHOPRIM 10000-0.1 UNIT/ML-% OP SOLN: 2.0000 [drp] | Freq: Four times a day (QID) | OPHTHALMIC | 0 refills | Status: DC

## 2017-03-03 NOTE — Progress Notes (Signed)
Thank you for the details you put in the comment boxes. Those details really help [place] take better care of you.   We are sorry that you are not feeling well.  Here is how we plan to help!  Based on what you have shared with me it looks like you have conjunctivitis.  Conjunctivitis is a common inflammatory or infectious condition of the eye that is often referred to as "pink eye".  In most cases it is contagious (viral or bacterial). However, not all conjunctivitis requires antibiotics (ex. Allergic).  We have made appropriate suggestions for you based upon your presentation.  I have prescribed Polytrim Ophthalmic drops 2 drops in both eyes 4 times a day times 5 days. This must go in both eyes to prevent spread of infection.   Pink eye can be highly contagious.  It is typically spread through direct contact with secretions, or contaminated objects or surfaces that one may have touched.  Strict handwashing is suggested with soap and water is urged.  If not available, use alcohol based had sanitizer.  Avoid unnecessary touching of the eye.  If you wear contact lenses, you will need to refrain from wearing them until you see no white discharge from the eye for at least 24 hours after being on medication.  You should see symptom improvement in 1-2 days after starting the medication regimen.  Call us if symptoms are not improved in 1-2 days.  Home Care:  Wash your hands often!  Do not wear your contacts until you complete your treatment plan.  Avoid sharing towels, bed linen, personal items with a person who has pink eye.  See attention for anyone in your home with similar symptoms.  Get Help Right Away If:  Your symptoms do not improve.  You develop blurred or loss of vision.  Your symptoms worsen (increased discharge, pain or redness)  Your e-visit answers were reviewed by a board certified advanced clinical practitioner to complete your personal care plan.  Depending on the condition, your  plan could have included both over the counter or prescription medications.  If there is a problem please reply  once you have received a response from your provider.  Your safety is important to [place].  If you have drug allergies check your prescription carefully.    You can use MyChart to ask questions about today's visit, request a non-urgent call back, or ask for a work or school excuse for 24 hours related to this e-Visit. If it has been greater than 24 hours you will need to follow up with your provider, or enter a new e-Visit to address those concerns.   You will get an e-mail in the next two days asking about your experience.  I hope that your e-visit has been valuable and will speed your recovery. Thank you for using e-visits.

## 2017-03-08 MED FILL — OMEPRAZOLE DR 40 MG CAPSULE: 40 | 90 days supply | Qty: 90 | Fill #1

## 2017-03-21 ENCOUNTER — Ambulatory Visit: Payer: Self-pay | Admitting: Registered"

## 2017-03-22 ENCOUNTER — Encounter: Payer: Self-pay | Admitting: Family Medicine

## 2017-04-02 ENCOUNTER — Encounter: Payer: Self-pay | Admitting: Registered"

## 2017-04-02 ENCOUNTER — Encounter: Payer: 59 | Attending: General Surgery | Admitting: Registered"

## 2017-04-02 DIAGNOSIS — Z888 Allergy status to other drugs, medicaments and biological substances status: Secondary | ICD-10-CM | POA: Diagnosis not present

## 2017-04-02 DIAGNOSIS — Z79899 Other long term (current) drug therapy: Secondary | ICD-10-CM | POA: Insufficient documentation

## 2017-04-02 DIAGNOSIS — K219 Gastro-esophageal reflux disease without esophagitis: Secondary | ICD-10-CM | POA: Insufficient documentation

## 2017-04-02 DIAGNOSIS — Z811 Family history of alcohol abuse and dependence: Secondary | ICD-10-CM | POA: Insufficient documentation

## 2017-04-02 DIAGNOSIS — Z9889 Other specified postprocedural states: Secondary | ICD-10-CM | POA: Insufficient documentation

## 2017-04-02 DIAGNOSIS — E079 Disorder of thyroid, unspecified: Secondary | ICD-10-CM | POA: Diagnosis not present

## 2017-04-02 DIAGNOSIS — Z713 Dietary counseling and surveillance: Secondary | ICD-10-CM | POA: Insufficient documentation

## 2017-04-02 DIAGNOSIS — Z8349 Family history of other endocrine, nutritional and metabolic diseases: Secondary | ICD-10-CM | POA: Insufficient documentation

## 2017-04-02 DIAGNOSIS — E7800 Pure hypercholesterolemia, unspecified: Secondary | ICD-10-CM | POA: Insufficient documentation

## 2017-04-02 DIAGNOSIS — E669 Obesity, unspecified: Secondary | ICD-10-CM

## 2017-04-02 NOTE — Progress Notes (Signed)
Follow-up visit:  8 Weeks Post-Operative Sleeve gastrectomy Surgery  Medical Nutrition Therapy:  Appt start time: 3:15 end time:  4:00.  Primary concerns today: Post-operative Bariatric Surgery Nutrition Management.  Non scale victories: easier to do a lot of things, does not get tired as easier, having more energy when walking the dog  Surgery date: 01/22/2017 Surgery type: Sleeve gastrectomy Start weight at [facility]: 287.8 lbs Weight today: 238.4 lbs Weight change: 32.6 lbs from 271 on 02/06/2017 Total weight lost: 49.4 lbs Weight loss goal: be healthy for 36 year old twins   TANITA  BODY COMP RESULTS  02/06/2017 04/02/2017   BMI (kg/m^2) 37.8 33.2   Fat Mass (lbs) 135.6 105.4   Fat Free Mass (lbs) 135.4 133.0   Total Body Water (lbs) 100.6 97.4    Pt arrives with 36 year old twins. Pt states she does not like greek yogurt. 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 Pt states she drinks her fluid better at work, not as much at home. Pt states she tried a sip of soda and does not like as much anymore. Pt is slightly less than 60g protein a day. Pt states she is still working on chewing well. Pt did not want to make follow-up appointment, stating that she will call back and schedule if needed.    Preferred Learning Style:   No preference indicated   Learning Readiness:   Ready  Change in progress  24-hr recall: B (AM): Premier protein (30g) or Dannon low-fat yogurt Snk (AM): none  L (PM): 1.5 oz fish or ground chicken (10.5g), string cheese (6g) Snk (PM): none  D (PM): 1.5 oz fish or ground chicken (10.5g), string cheese (6g) Snk (PM): none  Fluid intake: water (2, 20-30oz), Nature's Twist (12oz), Propel zero, low-fat milk (sometimes); 64+ oz Estimated total protein intake: 51-57g   Medications: See list Supplementation: Opurity + 3 Ca   Using straws: no Drinking while eating: no Having you been chewing well: yes Chewing/swallowing difficulties: no,   Changes in vision: no Changes to mood/headaches: no, no Hair loss/Cahnges to skin/Changes to nails: no, no, no Any difficulty focusing or concentrating: no Sweating: sometimes feels hot like a hot flash Dizziness/Lightheaded: no Palpitations: no  Carbonated beverages: sipped once N/V/D/C/GAS: no, no, no, no, no Abdominal Pain: no Dumping syndrome: no Last Lap-Band fill: N/A  Recent physical activity:  Walking 47mi, 7 days/week   Progress Towards Goal(s):  In progress.  Handouts given during visit include:  Phase 3B: High protein + NS vegetables   Nutritional Diagnosis:  NI-5.7.1 Inadequate protein intake As related to bariatric surgery post-op recommendations.  As evidenced by pt report of less than 60g protein a day.    Intervention:  Nutrition education and counseling on the importance of tracking food and fluid and meeting goals on a daily basis. Pt was also counseled on ways to track during her work day as well as at home.  Goals:  Follow Phase 3B: High Protein + Non-Starchy Vegetables  Eat 3-6 small meals/snacks, every 3-5 hrs  Increase lean protein foods to meet 60g goal  Increase fluid intake to 64oz +  Avoid drinking 15 minutes before, during and 30 minutes after eating  Aim for >30 min of physical activity daily - Track food and fluid intake with Baritastic App.  Teaching Method Utilized:  Visual Auditory Hands on  Barriers to learning/adherence to lifestyle change: work-life balance  Demonstrated degree of understanding via:  Teach Back  Monitoring/Evaluation:  Dietary intake, exercise, lap band fills, and body weight. Follow up in prn.

## 2017-04-02 NOTE — Patient Instructions (Signed)
-   Track food and fluid intake with Baritastic App.

## 2017-04-06 ENCOUNTER — Ambulatory Visit (INDEPENDENT_AMBULATORY_CARE_PROVIDER_SITE_OTHER): Payer: 59 | Admitting: Adult Health

## 2017-04-06 ENCOUNTER — Encounter: Payer: Self-pay | Admitting: Adult Health

## 2017-04-06 VITALS — BP 96/70 | Temp 97.8°F | Wt 237.0 lb

## 2017-04-06 DIAGNOSIS — B370 Candidal stomatitis: Secondary | ICD-10-CM

## 2017-04-06 DIAGNOSIS — K1379 Other lesions of oral mucosa: Secondary | ICD-10-CM | POA: Diagnosis not present

## 2017-04-06 LAB — POCT RAPID STREP A (OFFICE): Rapid Strep A Screen: NEGATIVE

## 2017-04-06 MED ORDER — MAGIC MOUTHWASH W/LIDOCAINE: 5.0000 mL | Freq: Three times a day (TID) | ORAL | 1 refills | Status: DC | PRN

## 2017-04-06 NOTE — Progress Notes (Addendum)
Subjective:    Patient ID: Jill Baker, female    DOB: [birth year], 35 y.o.   MRN: [id number]  HPI  36 year old female who  has a past medical history of Acid reflux; Cervical adenopathy; Colon polyps; Diverticulosis; Esophageal stricture; GERD (gastroesophageal reflux disease); Hiatal hernia; Hypertension; Hypothyroid; Hypothyroid; PONV (postoperative nausea and vomiting); Tachycardia; and Thyroid disease. She presents to the office today with the acute complaint of oral lesions. She reports oral lesions on her tongue, roof of mouth, and on the sides of her mouth. She was seen by her dentist who prescribed an oral steroid. She reports that this helped for 2-3 days but then her symptoms returned. She reports pain from the lesions and pain with swallowing. She does have enlarged swollen lymph nodes.   She denies any fevers or feeling acutely ill. She denies lesions elsewhere. No family members have the same symptoms.    Review of Systems See HPI   Past Medical History:  Diagnosis Date  . Acid reflux   . Cervical adenopathy    Pt denies blood dyscrasia  . Colon polyps    hyperplastic  . Diverticulosis   . Esophageal stricture   . GERD (gastroesophageal reflux disease)   . Hiatal hernia   . Hypertension   . Hypothyroid   . Hypothyroid   . PONV (postoperative nausea and vomiting)   . Tachycardia    controlled on metoprolol  . Thyroid disease     Social History   Social History  . Marital status: Single    Spouse name: N/A  . Number of children: N/A  . Years of education: N/A   Occupational History  . Not on file.   Social History Main Topics  . Smoking status: Never Smoker  . Smokeless tobacco: Never Used  . Alcohol use No  . Drug use: No  . Sexual activity: Not on file   Other Topics Concern  . Not on file   Social History Narrative  . No narrative on file    Past Surgical History:  Procedure Laterality Date  . Cervical lymph node Biopsy  02/24/2011   reactive only, per Dr. Georganna Skeans  . CESAREAN SECTION N/A 08/16/2012   Procedure: CESAREAN SECTION;  Surgeon: Elveria Royals, MD;  Location: [facility];  Service: Obstetrics;  Laterality: N/A;  . CHOLECYSTECTOMY    . FOOT SURGERY    . LAPAROSCOPIC GASTRIC SLEEVE RESECTION WITH HIATAL HERNIA REPAIR N/A 01/22/2017   Procedure: LAPAROSCOPIC GASTRIC SLEEVE RESECTION WITH HIATAL HERNIA REPAIR;  Surgeon: Greer Pickerel, MD;  Location: [facility];  Service: General;  Laterality: N/A;  . TONSILLECTOMY    . UPPER GI ENDOSCOPY  01/22/2017   Procedure: UPPER GI ENDOSCOPY;  Surgeon: Greer Pickerel, MD;  Location: [facility];  Service: General;;    Family History  Problem Relation Age of Onset  . Heart disease Mother   . Hypertension Mother   . Breast cancer Maternal Grandmother     Allergies  Allergen Reactions  . Nexium [Esomeprazole Magnesium] Rash    NEXIUM BRAND NAME ONLY    Current Outpatient Prescriptions on File Prior to Visit  Medication Sig Dispense Refill  . cetirizine (ZYRTEC) 10 MG tablet Take 10 mg by mouth daily.    Marland Kitchen ibuprofen (ADVIL,MOTRIN) 200 MG tablet Take 600 mg by mouth every 8 (eight) hours as needed for headache.    . levothyroxine (SYNTHROID, LEVOTHROID) 150 MCG tablet Take 1 tablet (150 mcg total) by mouth daily. [place]  tablet 3  . metoprolol succinate (TOPROL-XL) 100 MG 24 hr tablet TAKE 1 TABLET (100 MG TOTAL) BY MOUTH DAILY. TAKE WITH OR IMMEDIATELY FOLLOWING A MEAL. 90 tablet 3  . omeprazole (PRILOSEC) 40 MG capsule TAKE 1 CAPSULE (40 MG TOTAL) BY MOUTH DAILY. 90 capsule 3  . sertraline (ZOLOFT) 25 MG tablet Take 25 mg by mouth at bedtime.   4  . trimethoprim-polymyxin b (POLYTRIM) ophthalmic solution Place 2 drops into both eyes every 6 (six) hours. 10 mL 0   No current facility-administered medications on file prior to visit.     BP 96/70   Temp 97.8 F (36.6 C) (Oral)   Wt 237 lb (107.5 kg)   BMI 33.05 kg/m       Objective:   Physical Exam  Constitutional: She is  oriented to person, place, and time. She appears well-developed and well-nourished. No distress.  HENT:  Mouth/Throat: Uvula is midline, oropharynx is clear and moist and mucous membranes are normal. Oral lesions (flesh colored oral lesions noted throughout oral cavity. She has a white coating on her tongue ) present.  Cardiovascular: Normal rate, regular rhythm, normal heart sounds and intact distal pulses.  Exam reveals no gallop.   No murmur heard. Pulmonary/Chest: Effort normal and breath sounds normal. No respiratory distress. She has no wheezes. She has no rales. She exhibits no tenderness.  Neurological: She is alert and oriented to person, place, and time.  Skin: Skin is warm and dry. No rash noted. She is not diaphoretic. No erythema. No pallor.  Psychiatric: She has a normal mood and affect. Her behavior is normal. Judgment and thought content normal.  Vitals reviewed.     Assessment & Plan:   1. Other lesions of oral mucosa - Unknown cause of oral lesions at this point. Does not appear as HFM or oral malignancy. No other family members have the same symptoms. She did not have any other lesions on her body  - CBC with Differential/Platelet - Vitamin B12 - POC Rapid Strep A- negative  - Upper Respiratory Culture - magic mouthwash w/lidocaine SOLN; Take 5 mLs by mouth 3 (three) times daily as needed. Swish and spit  Dispense: 180 mL; Refill: 1 - Will follow up with.  - Consider referral to ENT   2. Thrush  - magic mouthwash w/lidocaine SOLN; Take 5 mLs by mouth 3 (three) times daily as needed. Swish and spit  Dispense: 180 mL; Refill: 1   Dorothyann Peng, NP

## 2017-04-06 NOTE — Addendum Note (Signed)
Addended by: Tomi Likens on: 04/06/2017 04:43 PM   Modules accepted: Orders

## 2017-04-07 LAB — CBC WITH DIFFERENTIAL/PLATELET
BASOS ABS: 43 {cells}/uL (ref 0–200)
Basophils Relative: 0.6 %
EOS ABS: 178 {cells}/uL (ref 15–500)
Eosinophils Relative: 2.5 %
HCT: 44.1 % (ref 35.0–45.0)
HEMOGLOBIN: 15.1 g/dL (ref 11.7–15.5)
Lymphs Abs: 1761 cells/uL (ref 850–3900)
MCH: 29.3 pg (ref 27.0–33.0)
MCHC: 34.2 g/dL (ref 32.0–36.0)
MCV: 85.5 fL (ref 80.0–100.0)
MONOS PCT: 9.6 %
MPV: 10.2 fL (ref 7.5–12.5)
NEUTROS ABS: 4438 {cells}/uL (ref 1500–7800)
Neutrophils Relative %: 62.5 %
Platelets: 251 10*3/uL (ref 140–400)
RBC: 5.16 10*6/uL — ABNORMAL HIGH (ref 3.80–5.10)
RDW: 13.7 % (ref 11.0–15.0)
TOTAL LYMPHOCYTE: 24.8 %
WBC mixed population: 682 cells/uL (ref 200–950)
WBC: 7.1 10*3/uL (ref 3.8–10.8)

## 2017-04-07 LAB — VITAMIN B12: VITAMIN B 12: 364 pg/mL (ref 200–1100)

## 2017-04-08 LAB — CULTURE, UPPER RESPIRATORY
MICRO NUMBER:: 81110557
SPECIMEN QUALITY: ADEQUATE

## 2017-04-09 ENCOUNTER — Other Ambulatory Visit: Payer: Self-pay | Admitting: Family Medicine

## 2017-04-09 DIAGNOSIS — K1370 Unspecified lesions of oral mucosa: Secondary | ICD-10-CM

## 2017-04-09 MED FILL — SERTRALINE HCL 25 MG TABLET: 25 | 30 days supply | Qty: 30 | Fill #0

## 2017-05-01 MED FILL — predniSONE 20 MG TABS: 20 | 15 days supply | Qty: 30 | Fill #0

## 2017-05-02 MED FILL — LEVOTHYROXINE 150 MCG TAB: 150 | 90 days supply | Qty: 90 | Fill #3

## 2017-05-04 ENCOUNTER — Encounter: Payer: Self-pay | Admitting: Family Medicine

## 2017-05-07 ENCOUNTER — Encounter: Payer: Self-pay | Admitting: Adult Health

## 2017-05-08 NOTE — Telephone Encounter (Signed)
Call in #[phone] with 3 rf  

## 2017-05-11 MED ORDER — SERTRALINE HCL 25 MG PO TABS: 25.0000 mg | ORAL_TABLET | Freq: Every day | ORAL | 3 refills | Status: DC

## 2017-05-11 MED FILL — SERTRALINE HCL 25 MG TABLET: 25 | 90 days supply | Qty: 90 | Fill #0

## 2017-05-11 NOTE — Telephone Encounter (Signed)
Refill sent and pt noticed through [place].

## 2017-05-14 ENCOUNTER — Encounter: Payer: Self-pay | Admitting: Family Medicine

## 2017-05-14 DIAGNOSIS — J343 Hypertrophy of nasal turbinates: Secondary | ICD-10-CM | POA: Diagnosis not present

## 2017-05-14 DIAGNOSIS — E039 Hypothyroidism, unspecified: Secondary | ICD-10-CM | POA: Diagnosis not present

## 2017-05-14 DIAGNOSIS — N911 Secondary amenorrhea: Secondary | ICD-10-CM | POA: Diagnosis not present

## 2017-05-14 DIAGNOSIS — R87610 Atypical squamous cells of undetermined significance on cytologic smear of cervix (ASC-US): Secondary | ICD-10-CM | POA: Diagnosis not present

## 2017-05-14 DIAGNOSIS — Z01419 Encounter for gynecological examination (general) (routine) without abnormal findings: Secondary | ICD-10-CM | POA: Diagnosis not present

## 2017-05-14 DIAGNOSIS — K140 Glossitis: Secondary | ICD-10-CM | POA: Diagnosis not present

## 2017-05-14 DIAGNOSIS — Z6831 Body mass index (BMI) 31.0-31.9, adult: Secondary | ICD-10-CM | POA: Diagnosis not present

## 2017-05-14 DIAGNOSIS — Z1151 Encounter for screening for human papillomavirus (HPV): Secondary | ICD-10-CM | POA: Diagnosis not present

## 2017-05-14 MED FILL — METOPROLOL SUCC ER 100 MG T: 100 | 90 days supply | Qty: 90 | Fill #2

## 2017-05-14 MED FILL — TRIAMCINOLONE 0.1% PASTE: 0.1 | 30 days supply | Qty: 5 | Fill #0

## 2017-05-14 MED FILL — MEDROXYPROGESTERONE 10 MG T: 10 | 84 days supply | Qty: 21 | Fill #0

## 2017-05-16 NOTE — Telephone Encounter (Signed)
Have her make an OV so we can discuss this is in more detail and I will plan on getting more detailed lab work that day

## 2017-05-31 DIAGNOSIS — K121 Other forms of stomatitis: Secondary | ICD-10-CM | POA: Diagnosis not present

## 2017-05-31 DIAGNOSIS — K140 Glossitis: Secondary | ICD-10-CM | POA: Diagnosis not present

## 2017-05-31 DIAGNOSIS — J392 Other diseases of pharynx: Secondary | ICD-10-CM | POA: Diagnosis not present

## 2017-06-01 DIAGNOSIS — Z3189 Encounter for other procreative management: Secondary | ICD-10-CM | POA: Diagnosis not present

## 2017-06-05 MED FILL — OMEPRAZOLE DR 40 MG CAPSULE: 40 | 90 days supply | Qty: 90 | Fill #2

## 2017-06-26 IMAGING — RF DG UGI W/ KUB
8 of 10 series · 15 of 24 positions shown · non-contrast
Comparison: CT abdomen/ pelvis dated 07/27/2015

CLINICAL DATA: Preop sleeve gastrectomy

EXAM:
UPPER GI SERIES WITH KUB
TECHNIQUE: After obtaining a scout radiograph a routine upper GI series was
performed using thin barium
FLUOROSCOPY TIME:  Fluoroscopy Time:  1 minutes 30 seconds
Radiation Exposure Index (if provided by the fluoroscopic device):
82.6 mGy
Number of Acquired Spot Images: 9

[Series 1: t abdomen supine · 0.15mm/px · 1 of 1 slices shown]
[im 1/1]
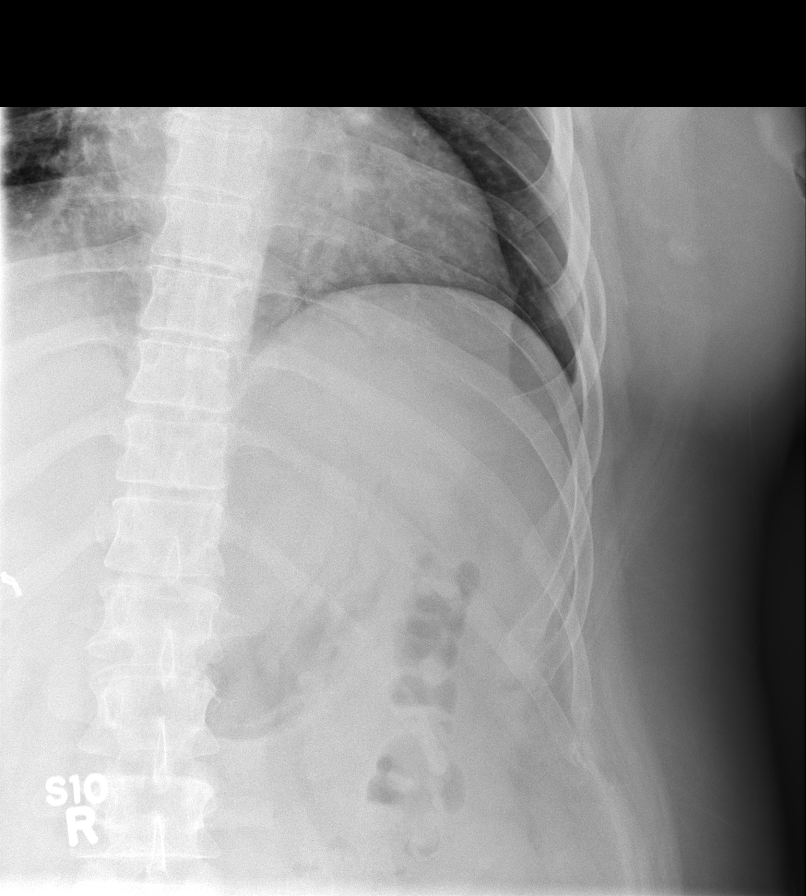

[Series 2: cp_standard · 0.51mm/px · 2 of 111 frames shown (1 of 5)]
[frame 17/111]
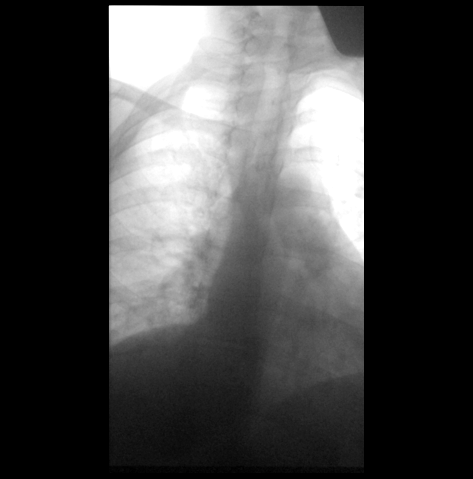
[frame 95/111]
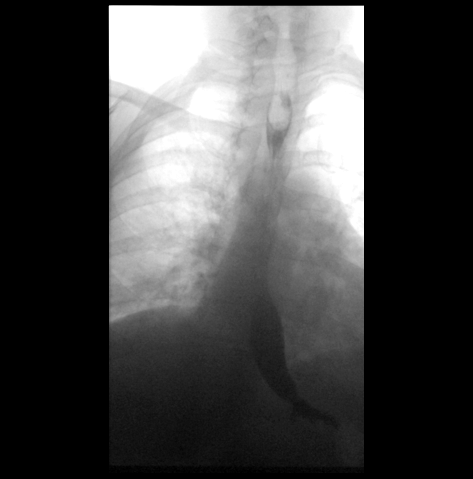

[Series 3: cp_standard · 0.34mm/px · 3 of 111 frames shown (2 of 5)]
[frame 11/111]
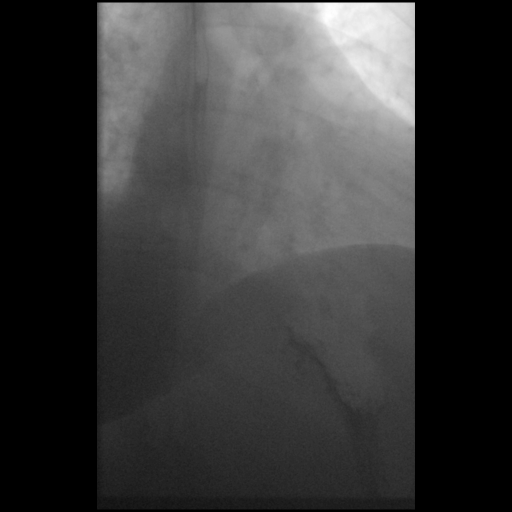
[frame 56/111]
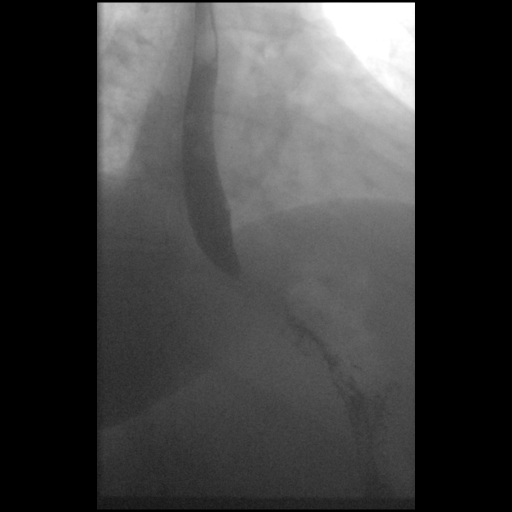
[frame 95/111]
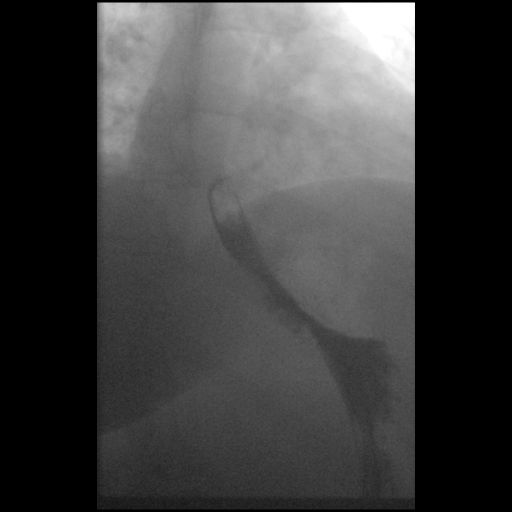

[Series 5: fluoro_barium 2fps_bw · 0.17mm/px · 1 of 1 slices shown (1 of 2)]
[im 1/1]
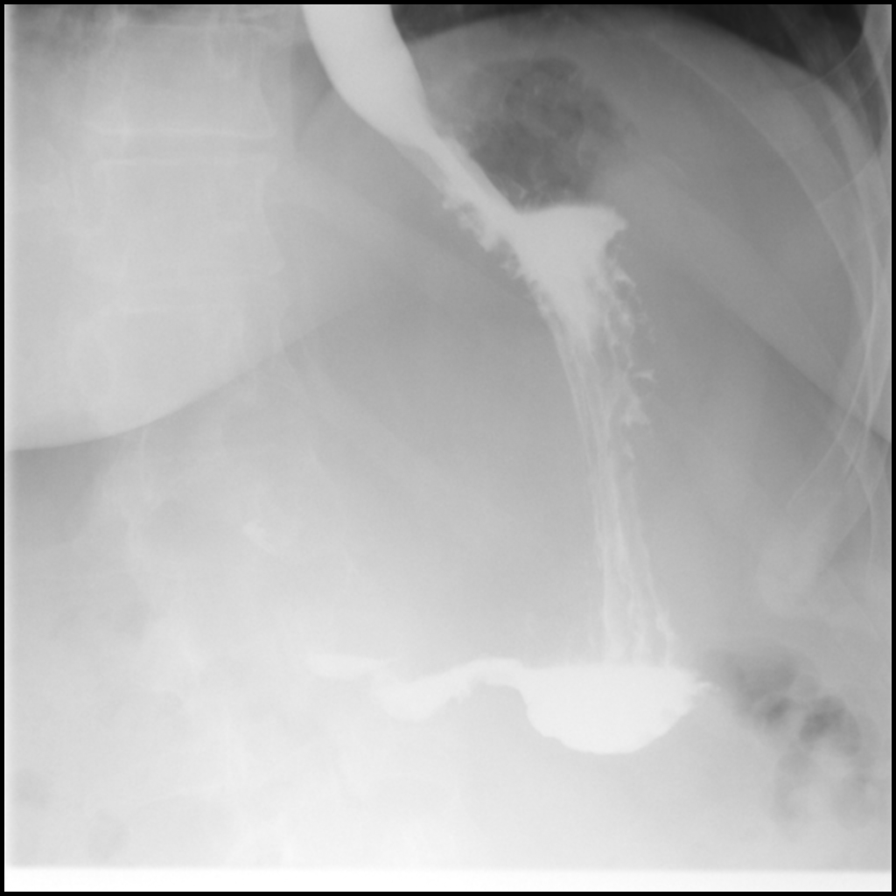

[Series 7: cp_standard · 0.55mm/px · 2 of 10 frames shown (3 of 5)]
[frame 2/10]
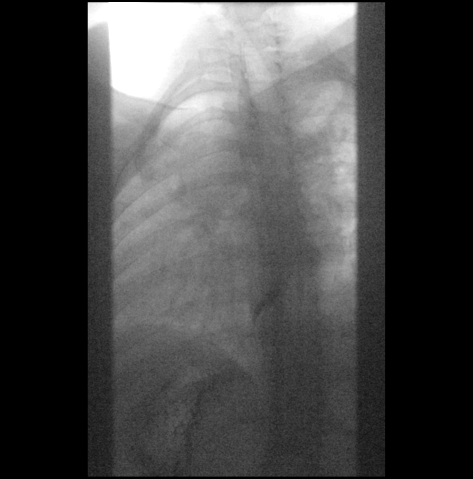
[frame 6/10]
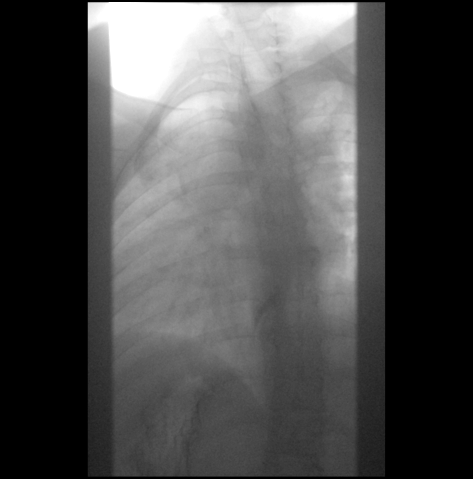

[Series 8: cp_standard · 0.55mm/px · 3 of 73 frames shown (4 of 5)]
[frame 11/73]
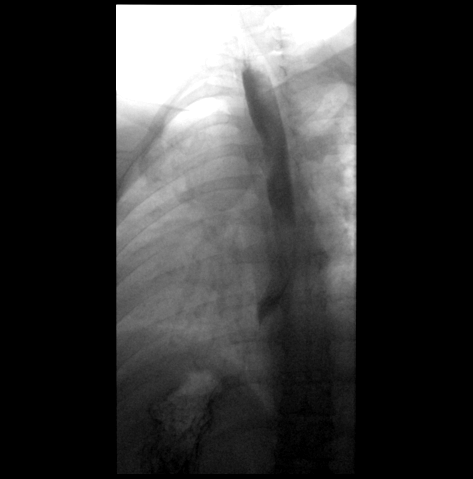
[frame 37/73]
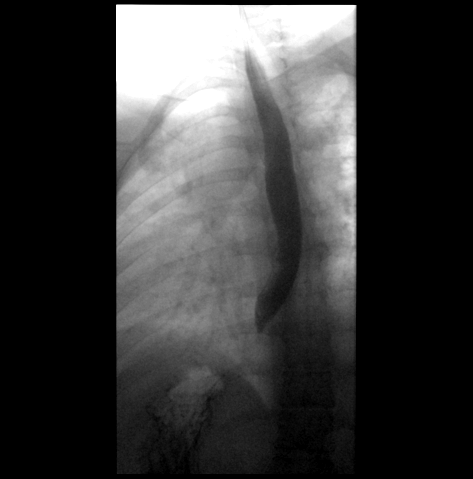
[frame 73/73]
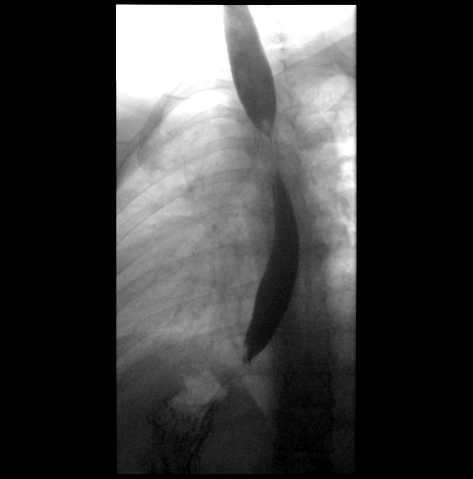

[Series 9: cp_standard · 0.55mm/px · 2 of 113 frames shown (5 of 5)]
[frame 17/113]
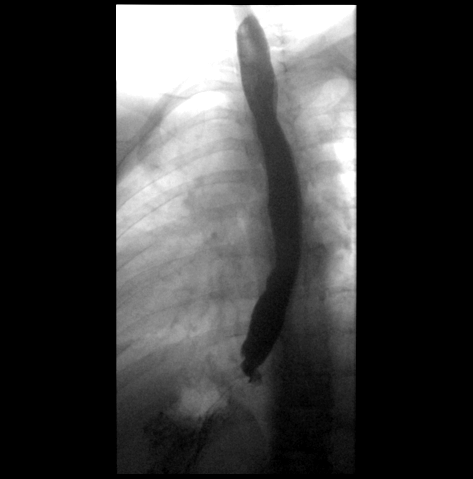
[frame 57/113]
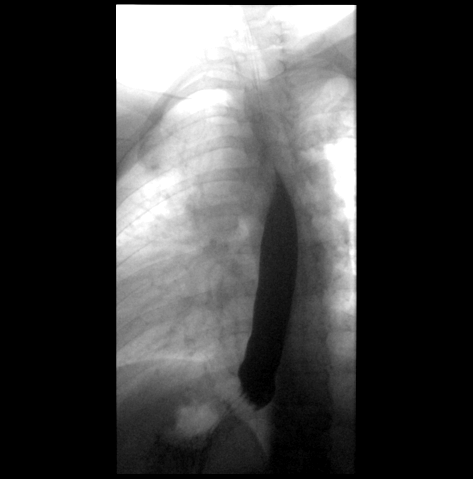

[Series 10: fluoro_barium 2fps_bw · 0.18mm/px · 1 of 1 slices shown (2 of 2)]
[im 1/1]
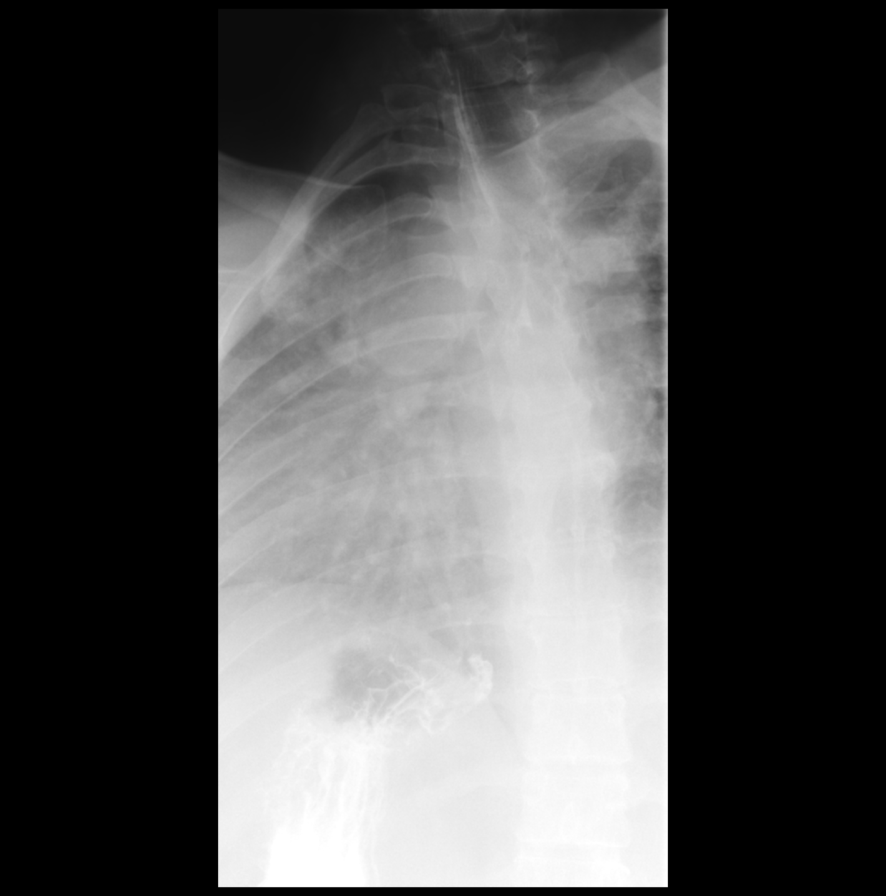

[15 of 24 positions shown; findings below may reference images not displayed]

FINDINGS: Scout radiographs demonstrate cholecystectomy clips.

Normal esophageal peristalsis.

No fixed esophageal narrowing or stricture.

No evidence of hiatal hernia.

Normal gastric folds.

No gastroesophageal reflux was demonstrated.
IMPRESSION: Negative upper GI.

## 2017-06-26 IMAGING — DX DG CHEST 2V
2 series · 2 of 2 positions shown · non-contrast
Comparison: Chest radiograph August 16, 2009 and chest CT
June 01, 2011

CLINICAL DATA: Preoperative sleeve gastrectomy procedure. Morbid
obesity.

EXAM:
CHEST  2 VIEW

[chest pa]
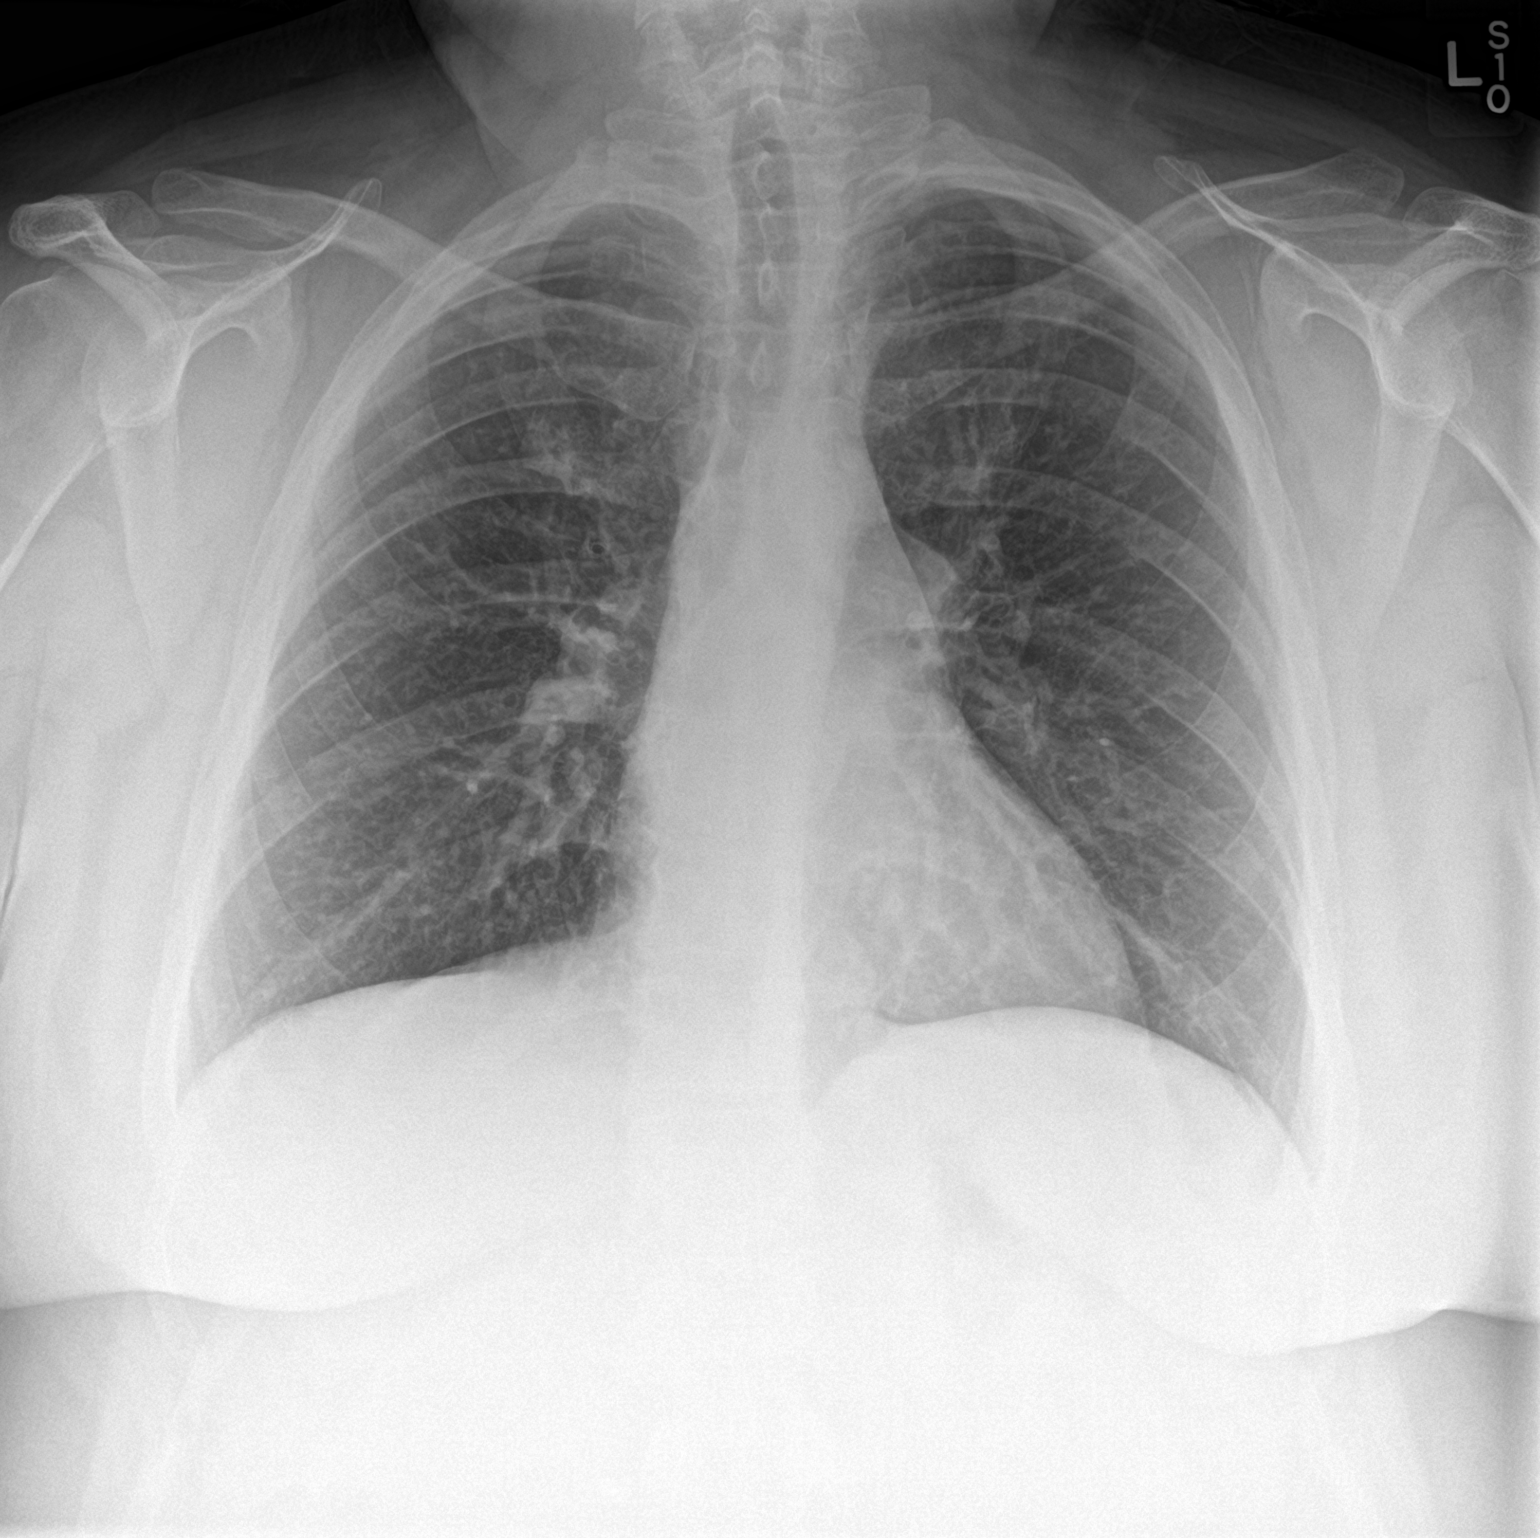

[chest lat]
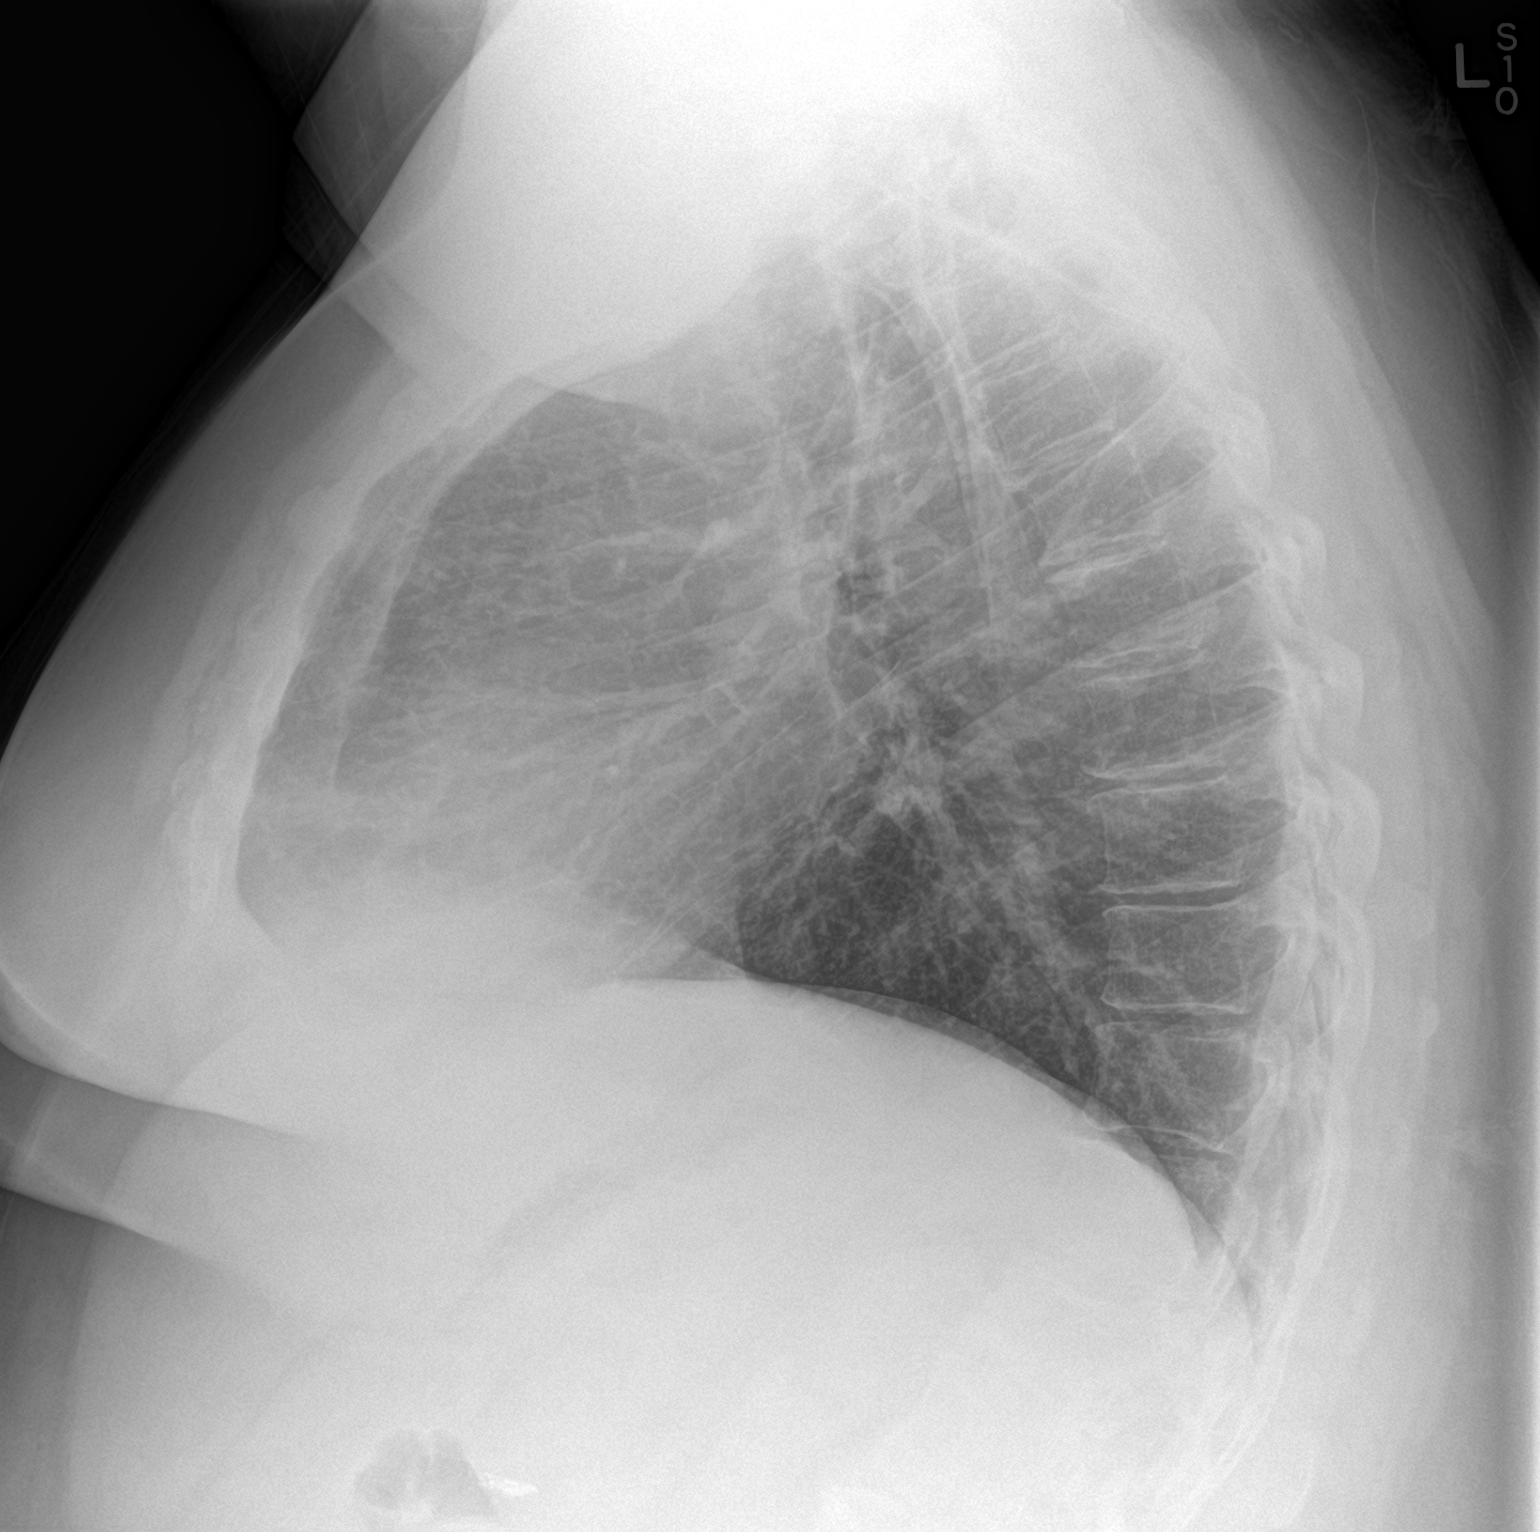

[2 of 2 positions shown; findings below may reference images not displayed]

FINDINGS: Lungs are clear. Heart size and pulmonary vascularity are normal. No
adenopathy. No bone lesions.
IMPRESSION: No edema or consolidation.

## 2017-07-21 DIAGNOSIS — M25512 Pain in left shoulder: Secondary | ICD-10-CM | POA: Diagnosis not present

## 2017-07-26 DIAGNOSIS — M67912 Unspecified disorder of synovium and tendon, left shoulder: Secondary | ICD-10-CM | POA: Diagnosis not present

## 2017-07-31 ENCOUNTER — Other Ambulatory Visit: Payer: Self-pay | Admitting: Family Medicine

## 2017-07-31 MED FILL — LEVOTHYROXINE 150 MCG TAB: 150 | 90 days supply | Qty: 90 | Fill #0

## 2017-07-31 MED FILL — SERTRALINE HCL 25 MG TABLET: 25 | 90 days supply | Qty: 90 | Fill #1

## 2017-07-31 NOTE — Telephone Encounter (Signed)
Last OV 04/06/2017 with Tommi Rumps  Rx last refilled 07/31/2016 disp 90 with 3 refills  TSH last tested on 07/09/2015  Sent to PCP for apporval

## 2017-08-06 MED FILL — METOPROLOL SUCCINATE ER 100: 100 | 90 days supply | Qty: 90 | Fill #3

## 2017-08-13 DIAGNOSIS — M19212 Secondary osteoarthritis, left shoulder: Secondary | ICD-10-CM | POA: Diagnosis not present

## 2017-08-13 DIAGNOSIS — M25512 Pain in left shoulder: Secondary | ICD-10-CM | POA: Diagnosis not present

## 2017-08-14 ENCOUNTER — Ambulatory Visit: Payer: 59 | Admitting: Family Medicine

## 2017-08-17 DIAGNOSIS — M25512 Pain in left shoulder: Secondary | ICD-10-CM | POA: Diagnosis not present

## 2017-08-19 ENCOUNTER — Telehealth: Payer: 59 | Admitting: Family

## 2017-08-19 DIAGNOSIS — R6889 Other general symptoms and signs: Secondary | ICD-10-CM

## 2017-08-19 MED ORDER — OSELTAMIVIR PHOSPHATE 75 MG PO CAPS: 75.0000 mg | ORAL_CAPSULE | Freq: Two times a day (BID) | ORAL | 0 refills | Status: DC

## 2017-08-19 NOTE — Progress Notes (Signed)

## 2017-08-23 DIAGNOSIS — E663 Overweight: Secondary | ICD-10-CM | POA: Diagnosis not present

## 2017-08-23 DIAGNOSIS — M19012 Primary osteoarthritis, left shoulder: Secondary | ICD-10-CM | POA: Diagnosis not present

## 2017-08-23 DIAGNOSIS — Z87898 Personal history of other specified conditions: Secondary | ICD-10-CM | POA: Diagnosis not present

## 2017-08-23 DIAGNOSIS — M25512 Pain in left shoulder: Secondary | ICD-10-CM | POA: Diagnosis not present

## 2017-08-23 DIAGNOSIS — K912 Postsurgical malabsorption, not elsewhere classified: Secondary | ICD-10-CM | POA: Diagnosis not present

## 2017-08-23 DIAGNOSIS — Z9884 Bariatric surgery status: Secondary | ICD-10-CM | POA: Diagnosis not present

## 2017-09-10 MED FILL — OMEPRAZOLE DR 40 MG CAPSULE: 40 | 90 days supply | Qty: 90 | Fill #3

## 2017-09-18 DIAGNOSIS — L089 Local infection of the skin and subcutaneous tissue, unspecified: Secondary | ICD-10-CM | POA: Diagnosis not present

## 2017-09-18 DIAGNOSIS — Z9884 Bariatric surgery status: Secondary | ICD-10-CM | POA: Diagnosis not present

## 2017-09-18 DIAGNOSIS — Z5181 Encounter for therapeutic drug level monitoring: Secondary | ICD-10-CM | POA: Diagnosis not present

## 2017-09-18 DIAGNOSIS — N9489 Other specified conditions associated with female genital organs and menstrual cycle: Secondary | ICD-10-CM | POA: Diagnosis not present

## 2017-09-18 DIAGNOSIS — K140 Glossitis: Secondary | ICD-10-CM | POA: Diagnosis not present

## 2017-09-18 DIAGNOSIS — K120 Recurrent oral aphthae: Secondary | ICD-10-CM | POA: Diagnosis not present

## 2017-09-18 MED FILL — COLCHICINE 0.6 MG TABS: 0.6 | 30 days supply | Qty: 60 | Fill #0

## 2017-09-18 MED FILL — CLOBETASOL PROPIONATE 0.05: 0.05 | 30 days supply | Qty: 45 | Fill #0

## 2017-10-26 MED FILL — LEVOTHYROXINE 150 MCG TAB: 150 | 90 days supply | Qty: 90 | Fill #1

## 2017-10-26 MED FILL — SERTRALINE HCL 25 MG TABLET: 25 | 90 days supply | Qty: 90 | Fill #2

## 2017-10-26 MED FILL — COLCHICINE 0.6 MG TABS: 0.6 | 30 days supply | Qty: 60 | Fill #1

## 2017-11-09 ENCOUNTER — Encounter: Payer: 59 | Admitting: Family Medicine

## 2017-11-14 ENCOUNTER — Other Ambulatory Visit: Payer: Self-pay | Admitting: Family Medicine

## 2017-11-14 MED FILL — METOPROLOL SUCCINATE ER 100: 100 | 30 days supply | Qty: 30 | Fill #0

## 2017-11-15 DIAGNOSIS — M67912 Unspecified disorder of synovium and tendon, left shoulder: Secondary | ICD-10-CM | POA: Diagnosis not present

## 2017-11-22 ENCOUNTER — Telehealth: Payer: 59 | Admitting: Family

## 2017-11-22 DIAGNOSIS — T7840XA Allergy, unspecified, initial encounter: Secondary | ICD-10-CM

## 2017-11-22 DIAGNOSIS — R21 Rash and other nonspecific skin eruption: Secondary | ICD-10-CM

## 2017-11-22 MED ORDER — PREDNISONE 5 MG PO TABS: 5.0000 mg | ORAL_TABLET | ORAL | 0 refills | Status: DC

## 2017-11-22 MED FILL — predniSONE 5 MG TABS: 5 | 6 days supply | Qty: 21 | Fill #0

## 2017-11-22 NOTE — Progress Notes (Signed)
Thank you for the details you included in the comment boxes. Those details are very helpful in determining the best course of treatment for you and help [place] to provide the best care.  E Visit for Rash  We are sorry that you are not feeling well. Here is how we plan to help!   Based on what you shared with me you may have a virus or an allergic reaction.  Avoid contact with pregnant women until a diagnosis is made.  Most viral rashes are contagious (especially if a fever is present).  You can return to work or school after the rash is gone or when your doctor says it is safe to return with the rash.    Prednisone 5 mg daily for 6 days (see taper instructions below)  Directions for 6 day taper: Day 1: 2 tablets before breakfast, 1 after both lunch & dinner and 2 at bedtime Day 2: 1 tab before breakfast, 1 after both lunch & dinner and 2 at bedtime Day 3: 1 tab at each meal & 1 at bedtime Day 4: 1 tab at breakfast, 1 at lunch, 1 at bedtime Day 5: 1 tab at breakfast & 1 tab at bedtime Day 6: 1 tab at breakfast    HOME CARE:   Take cool showers and avoid direct sunlight.  Apply cool compress or wet dressings.  Take a bath in an oatmeal bath.  Sprinkle content of one Aveeno packet under running faucet with comfortably warm water.  Bathe for 15-20 minutes, 1-2 times daily.  Pat dry with a towel. Do not rub the rash.  Use hydrocortisone cream.  Take an antihistamine like Benadryl for widespread rashes that itch.  The adult dose of Benadryl is 25-50 mg by mouth 4 times daily.  Caution:  This type of medication may cause sleepiness.  Do not drink alcohol, drive, or operate dangerous machinery while taking antihistamines.  Do not take these medications if you have prostate enlargement.  Read package instructions thoroughly on all medications that you take.  GET HELP RIGHT AWAY IF:   Symptoms don't go away after treatment.  Severe itching that persists.  If you rash spreads or  swells.  If you rash begins to smell.  If it blisters and opens or develops a yellow-brown crust.  You develop a fever.  You have a sore throat.  You become short of breath.  MAKE SURE YOU:  Understand these instructions. Will watch your condition. Will get help right away if you are not doing well or get worse.  Thank you for choosing an e-visit. Your e-visit answers were reviewed by a board certified advanced clinical practitioner to complete your personal care plan. Depending upon the condition, your plan could have included both over the counter or prescription medications. Please review your pharmacy choice. Be sure that the pharmacy you have chosen is open so that you can pick up your prescription now.  If there is a problem you may message your provider in [place] to have the prescription routed to another pharmacy. Your safety is important to [place]. If you have drug allergies check your prescription carefully.  For the next 24 hours, you can use MyChart to ask questions about today's visit, request a non-urgent call back, or ask for a work or school excuse from your e-visit provider. You will get an email in the next two days asking about your experience. I hope that your e-visit has been valuable and will speed your recovery.

## 2017-11-29 ENCOUNTER — Ambulatory Visit (INDEPENDENT_AMBULATORY_CARE_PROVIDER_SITE_OTHER): Payer: 59 | Admitting: Family Medicine

## 2017-11-29 ENCOUNTER — Other Ambulatory Visit: Payer: Self-pay | Admitting: Orthopedic Surgery

## 2017-11-29 ENCOUNTER — Encounter: Payer: Self-pay | Admitting: Family Medicine

## 2017-11-29 VITALS — BP 90/66 | HR 74 | Temp 97.4°F | Ht 71.0 in | Wt 204.2 lb

## 2017-11-29 DIAGNOSIS — Z23 Encounter for immunization: Secondary | ICD-10-CM

## 2017-11-29 DIAGNOSIS — E039 Hypothyroidism, unspecified: Secondary | ICD-10-CM | POA: Diagnosis not present

## 2017-11-29 DIAGNOSIS — Z0000 Encounter for general adult medical examination without abnormal findings: Secondary | ICD-10-CM

## 2017-11-29 LAB — LDL CHOLESTEROL, DIRECT: Direct LDL: 165 mg/dL

## 2017-11-29 LAB — CBC WITH DIFFERENTIAL/PLATELET
Basophils Absolute: 0 10*3/uL (ref 0.0–0.1)
Basophils Relative: 0.8 % (ref 0.0–3.0)
EOS PCT: 3 % (ref 0.0–5.0)
Eosinophils Absolute: 0.2 10*3/uL (ref 0.0–0.7)
HEMATOCRIT: 45.7 % (ref 36.0–46.0)
Hemoglobin: 15.8 g/dL — ABNORMAL HIGH (ref 12.0–15.0)
LYMPHS PCT: 23.9 % (ref 12.0–46.0)
Lymphs Abs: 1.5 10*3/uL (ref 0.7–4.0)
MCHC: 34.5 g/dL (ref 30.0–36.0)
MCV: 85.8 fl (ref 78.0–100.0)
MONOS PCT: 10.3 % (ref 3.0–12.0)
Monocytes Absolute: 0.7 10*3/uL (ref 0.1–1.0)
NEUTROS ABS: 4 10*3/uL (ref 1.4–7.7)
Neutrophils Relative %: 62 % (ref 43.0–77.0)
Platelets: 266 10*3/uL (ref 150.0–400.0)
RBC: 5.33 Mil/uL — AB (ref 3.87–5.11)
RDW: 13.4 % (ref 11.5–15.5)
WBC: 6.5 10*3/uL (ref 4.0–10.5)

## 2017-11-29 LAB — LIPID PANEL
CHOL/HDL RATIO: 4
Cholesterol: 261 mg/dL — ABNORMAL HIGH (ref 0–200)
HDL: 60.7 mg/dL (ref >39.00)
NONHDL: 200.31
Triglycerides: 219 mg/dL — ABNORMAL HIGH (ref 0.0–149.0)
VLDL: 43.8 mg/dL — AB (ref 0.0–40.0)

## 2017-11-29 LAB — POC URINALSYSI DIPSTICK (AUTOMATED)
BILIRUBIN UA: NEGATIVE
Blood, UA: NEGATIVE
Glucose, UA: NEGATIVE
Ketones, UA: NEGATIVE
LEUKOCYTES UA: NEGATIVE
NITRITE UA: NEGATIVE
Protein, UA: NEGATIVE
Spec Grav, UA: 1.02 (ref 1.010–1.025)
Urobilinogen, UA: 0.2 E.U./dL
pH, UA: 6 (ref 5.0–8.0)

## 2017-11-29 LAB — HEPATIC FUNCTION PANEL
ALK PHOS: 42 U/L (ref 39–117)
ALT: 13 U/L (ref 0–35)
AST: 12 U/L (ref 0–37)
Albumin: 3.9 g/dL (ref 3.5–5.2)
BILIRUBIN DIRECT: 0.1 mg/dL (ref 0.0–0.3)
BILIRUBIN TOTAL: 0.5 mg/dL (ref 0.2–1.2)
Total Protein: 6.5 g/dL (ref 6.0–8.3)

## 2017-11-29 LAB — BASIC METABOLIC PANEL
BUN: 21 mg/dL (ref 6–23)
CHLORIDE: 104 meq/L (ref 96–112)
CO2: 27 mEq/L (ref 19–32)
Calcium: 9.1 mg/dL (ref 8.4–10.5)
Creatinine, Ser: 0.76 mg/dL (ref 0.40–1.20)
GFR: 91.84 mL/min (ref >60.00)
Glucose, Bld: 77 mg/dL (ref 70–99)
POTASSIUM: 4 meq/L (ref 3.5–5.1)
SODIUM: 138 meq/L (ref 135–145)

## 2017-11-29 LAB — T4, FREE: Free T4: 2.18 ng/dL — ABNORMAL HIGH (ref 0.60–1.60)

## 2017-11-29 LAB — TSH: TSH: 0.31 u[IU]/mL — ABNORMAL LOW (ref 0.35–4.50)

## 2017-11-29 LAB — T3, FREE: T3, Free: 8.8 pg/mL — ABNORMAL HIGH (ref 2.3–4.2)

## 2017-11-29 MED ORDER — METOPROLOL SUCCINATE ER 100 MG PO TB24: 100.0000 mg | ORAL_TABLET | Freq: Every day | ORAL | 3 refills | Status: DC

## 2017-11-29 MED ORDER — OMEPRAZOLE 40 MG PO CPDR: 40.0000 mg | DELAYED_RELEASE_CAPSULE | Freq: Every day | ORAL | 3 refills | Status: DC

## 2017-11-29 MED FILL — OMEPRAZOLE DR 40 MG CAPSULE: 40 | 90 days supply | Qty: 90 | Fill #0

## 2017-11-29 NOTE — Progress Notes (Signed)
   Subjective:    Patient ID: Jill Baker, female    DOB: 1980/08/01, 37 y.o.   MRN: [id number]  HPI Here for a well exam. She feels well. She had a gastric sleeve surgery last August and this went well. She has lost 90 lbs and has more energy now. 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    Review of Systems  Constitutional: Negative.   HENT: Negative.   Eyes: Negative.   Respiratory: Negative.   Cardiovascular: Negative.   Gastrointestinal: Negative.   Genitourinary: Negative for decreased urine volume, difficulty urinating, dyspareunia, dysuria, enuresis, flank pain, frequency, hematuria, pelvic pain and urgency.  Musculoskeletal: Negative.   Skin: Negative.   Neurological: Negative.   Psychiatric/Behavioral: Negative.        Objective:   Physical Exam  Constitutional: She is oriented to person, place, and time. She appears well-developed and well-nourished. No distress.  HENT:  Head: Normocephalic and atraumatic.  Right Ear: External ear normal.  Left Ear: External ear normal.  Nose: Nose normal.  Mouth/Throat: Oropharynx is clear and moist. No oropharyngeal exudate.  Eyes: Pupils are equal, round, and reactive to light. Conjunctivae and EOM are normal. No scleral icterus.  Neck: Normal range of motion. Neck supple. No JVD present. No thyromegaly present.  Cardiovascular: Normal rate, regular rhythm, normal heart sounds and intact distal pulses. Exam reveals no gallop and no friction rub.  No murmur heard. Pulmonary/Chest: Effort normal and breath sounds normal. No respiratory distress. She has no wheezes. She has no rales. She exhibits no tenderness.  Abdominal: Soft. Bowel sounds are normal. She exhibits no distension and no mass. There is no tenderness. There is no rebound and no guarding.  Musculoskeletal: Normal range of motion. She exhibits no edema or tenderness.  Lymphadenopathy:    She has no cervical adenopathy.  Neurological: She is alert and  oriented to person, place, and time. She has normal reflexes. She displays normal reflexes. No cranial nerve deficit. She exhibits normal muscle tone. Coordination normal.  Skin: Skin is warm and dry. No rash noted. No erythema.  Psychiatric: She has a normal mood and affect. Her behavior is normal. Judgment and thought content normal.          Assessment & Plan:  Well exam. We discussed diet and exercise. Get fasting labs.  Alysia Penna, MD

## 2017-12-01 NOTE — Addendum Note (Signed)
Addended by: Alysia Penna A on: 12/01/2017 10:24 AM   Modules accepted: Orders

## 2017-12-03 ENCOUNTER — Other Ambulatory Visit: Payer: Self-pay

## 2017-12-03 MED ORDER — LEVOTHYROXINE SODIUM 100 MCG PO TABS: 100.0000 ug | ORAL_TABLET | Freq: Every day | ORAL | 3 refills | Status: DC

## 2017-12-03 MED ORDER — ATORVASTATIN CALCIUM 20 MG PO TABS: 20.0000 mg | ORAL_TABLET | Freq: Every day | ORAL | 3 refills | Status: DC

## 2017-12-03 MED FILL — ATORVASTATIN 20 MG TABLET: 20 | 90 days supply | Qty: 90 | Fill #0

## 2017-12-03 MED FILL — LEVOTHYROXINE 100 MCG TABLE: 100 | 90 days supply | Qty: 90 | Fill #0

## 2017-12-18 MED FILL — METOPROLOL SUCCINATE ER 100: 100 | 90 days supply | Qty: 90 | Fill #0

## 2017-12-20 ENCOUNTER — Encounter (HOSPITAL_BASED_OUTPATIENT_CLINIC_OR_DEPARTMENT_OTHER): Payer: Self-pay

## 2017-12-20 ENCOUNTER — Other Ambulatory Visit: Payer: Self-pay

## 2017-12-20 NOTE — Progress Notes (Signed)
Allergic to CHG Spoke with:  Caryl Pina NPO:  After Midnight, no gum, candy, or mints  Arrival time: 0530AM Labs: EKG, ISTAT 4 AM medications: Atorvastatin, Cetirizine, Levothyroxine, Metoprolol, Omeprazole Pre op orders: Yes Ride home:  Tawni Pummel (friend) [phone] (mother) [phone]

## 2017-12-25 NOTE — Anesthesia Preprocedure Evaluation (Addendum)
Anesthesia Evaluation  Patient identified by MRN, date of birth, ID band Patient awake    Reviewed: Allergy & Precautions, NPO status , Patient's Chart, lab work & pertinent test results  History of Anesthesia Complications (+) PONV  Airway Mallampati: II  TM Distance: >3 FB Neck ROM: Full    Dental  (+) Dental Advisory Given   Pulmonary neg pulmonary ROS,    breath sounds clear to auscultation       Cardiovascular (-) angina Rhythm:Regular Rate:Normal  Palpitations controlled with metoprolol 2011 cardiac MRI:  Normal LV size and systolic function, EF 55.7% with no regional wall motion abnormalities, Normal RV size and systolic function   Neuro/Psych negative neurological ROS     GI/Hepatic Neg liver ROS, GERD  Medicated and Controlled,H/o gastric sleeve 2018   Endo/Other  Hypothyroidism   Renal/GU negative Renal ROS     Musculoskeletal   Abdominal   Peds  Hematology negative hematology ROS (+)   Anesthesia Other Findings   Reproductive/Obstetrics                            Anesthesia Physical Anesthesia Plan  ASA: II  Anesthesia Plan: General   Post-op Pain Management: GA combined w/ Regional for post-op pain   Induction: Intravenous  PONV Risk Score and Plan: 4 or greater and Dexamethasone, Ondansetron and Scopolamine patch - Pre-op  Airway Management Planned: Oral ETT  Additional Equipment:   Intra-op Plan:   Post-operative Plan: Extubation in OR  Informed Consent: I have reviewed the patients History and Physical, chart, labs and discussed the procedure including the risks, benefits and alternatives for the proposed anesthesia with the patient or authorized representative who has indicated his/her understanding and acceptance.   Dental advisory given  Plan Discussed with: CRNA and Surgeon  Anesthesia Plan Comments: (Plan routine monitors, GETA with interscalene block  for post-op analgesia)        Anesthesia Quick Evaluation

## 2017-12-26 ENCOUNTER — Encounter (HOSPITAL_BASED_OUTPATIENT_CLINIC_OR_DEPARTMENT_OTHER)
Admission: RE | Disposition: A | Discharge status: Home / Self Care | Payer: Self-pay | Source: Ambulatory Visit | Attending: Orthopedic Surgery

## 2017-12-26 ENCOUNTER — Encounter (HOSPITAL_BASED_OUTPATIENT_CLINIC_OR_DEPARTMENT_OTHER): Payer: Self-pay | Admitting: Certified Registered Nurse Anesthetist

## 2017-12-26 ENCOUNTER — Ambulatory Visit (HOSPITAL_BASED_OUTPATIENT_CLINIC_OR_DEPARTMENT_OTHER): Payer: 59 | Admitting: Anesthesiology

## 2017-12-26 ENCOUNTER — Other Ambulatory Visit: Payer: Self-pay

## 2017-12-26 ENCOUNTER — Ambulatory Visit (HOSPITAL_BASED_OUTPATIENT_CLINIC_OR_DEPARTMENT_OTHER)
Admission: RE | Admit: 2017-12-26 | Discharge: 2017-12-26 | Disposition: A | Discharge status: Home / Self Care | Payer: 59 | Source: Ambulatory Visit | Attending: Orthopedic Surgery | Admitting: Orthopedic Surgery

## 2017-12-26 DIAGNOSIS — E039 Hypothyroidism, unspecified: Secondary | ICD-10-CM | POA: Insufficient documentation

## 2017-12-26 DIAGNOSIS — M75112 Incomplete rotator cuff tear or rupture of left shoulder, not specified as traumatic: Secondary | ICD-10-CM | POA: Insufficient documentation

## 2017-12-26 DIAGNOSIS — G8918 Other acute postprocedural pain: Secondary | ICD-10-CM | POA: Diagnosis not present

## 2017-12-26 DIAGNOSIS — M75102 Unspecified rotator cuff tear or rupture of left shoulder, not specified as traumatic: Secondary | ICD-10-CM | POA: Diagnosis not present

## 2017-12-26 DIAGNOSIS — Z79899 Other long term (current) drug therapy: Secondary | ICD-10-CM | POA: Diagnosis not present

## 2017-12-26 DIAGNOSIS — K219 Gastro-esophageal reflux disease without esophagitis: Secondary | ICD-10-CM | POA: Insufficient documentation

## 2017-12-26 DIAGNOSIS — Z8719 Personal history of other diseases of the digestive system: Secondary | ICD-10-CM | POA: Diagnosis not present

## 2017-12-26 DIAGNOSIS — S46212A Strain of muscle, fascia and tendon of other parts of biceps, left arm, initial encounter: Secondary | ICD-10-CM

## 2017-12-26 DIAGNOSIS — M7522 Bicipital tendinitis, left shoulder: Secondary | ICD-10-CM | POA: Diagnosis not present

## 2017-12-26 DIAGNOSIS — Z86010 Personal history of colonic polyps: Secondary | ICD-10-CM | POA: Insufficient documentation

## 2017-12-26 DIAGNOSIS — Z8249 Family history of ischemic heart disease and other diseases of the circulatory system: Secondary | ICD-10-CM | POA: Diagnosis not present

## 2017-12-26 DIAGNOSIS — M19012 Primary osteoarthritis, left shoulder: Secondary | ICD-10-CM | POA: Diagnosis not present

## 2017-12-26 DIAGNOSIS — K760 Fatty (change of) liver, not elsewhere classified: Secondary | ICD-10-CM | POA: Diagnosis not present

## 2017-12-26 DIAGNOSIS — M7542 Impingement syndrome of left shoulder: Secondary | ICD-10-CM | POA: Insufficient documentation

## 2017-12-26 HISTORY — DX: Localized skin eruption due to drugs and medicaments taken internally: L27.1

## 2017-12-26 HISTORY — DX: Fatty (change of) liver, not elsewhere classified: K76.0

## 2017-12-26 HISTORY — DX: Glossitis: K14.0

## 2017-12-26 HISTORY — PX: SHOULDER ARTHROSCOPY: SHX128

## 2017-12-26 LAB — POCT I-STAT 4, (NA,K, GLUC, HGB,HCT)
GLUCOSE: 88 mg/dL (ref 70–99)
HCT: 38 % (ref 36.0–46.0)
Hemoglobin: 12.9 g/dL (ref 12.0–15.0)
Potassium: 3.8 mmol/L (ref 3.5–5.1)
Sodium: 142 mmol/L (ref 135–145)

## 2017-12-26 LAB — POCT PREGNANCY, URINE: PREG TEST UR: NEGATIVE

## 2017-12-26 SURGERY — ARTHROSCOPY, SHOULDER
Anesthesia: Choice | Site: Shoulder | Laterality: Left

## 2017-12-26 MED ORDER — MEPERIDINE HCL 25 MG/ML IJ SOLN
6.2500 mg | INTRAMUSCULAR | Status: DC | PRN
  Administered 2017-12-26: 6.25 mg via INTRAVENOUS
  Filled 2017-12-26: qty 1

## 2017-12-26 MED ORDER — FENTANYL CITRATE (PF) 100 MCG/2ML IJ SOLN
INTRAMUSCULAR | Status: AC
  Filled 2017-12-26: qty 2

## 2017-12-26 MED ORDER — LACTATED RINGERS IV SOLN
INTRAVENOUS | Status: DC
  Filled 2017-12-26: qty 1000

## 2017-12-26 MED ORDER — DEXAMETHASONE SODIUM PHOSPHATE 10 MG/ML IJ SOLN
INTRAMUSCULAR | Status: DC | PRN
  Administered 2017-12-26: 10 mg via INTRAVENOUS

## 2017-12-26 MED ORDER — MEPERIDINE HCL 25 MG/ML IJ SOLN
INTRAMUSCULAR | Status: AC
  Filled 2017-12-26: qty 1

## 2017-12-26 MED ORDER — SUGAMMADEX SODIUM 200 MG/2ML IV SOLN
INTRAVENOUS | Status: AC
  Filled 2017-12-26: qty 2

## 2017-12-26 MED ORDER — SODIUM CHLORIDE 0.9 % IR SOLN
Status: DC | PRN
  Administered 2017-12-26: 3000 mL
  Administered 2017-12-26: 6000 mL

## 2017-12-26 MED ORDER — PROPOFOL 10 MG/ML IV BOLUS
INTRAVENOUS | Status: DC | PRN
  Administered 2017-12-26: 150 mg via INTRAVENOUS
  Administered 2017-12-26: 20 mg via INTRAVENOUS

## 2017-12-26 MED ORDER — LACTATED RINGERS IV SOLN
INTRAVENOUS | Status: DC
  Administered 2017-12-26 (×3): via INTRAVENOUS
  Filled 2017-12-26: qty 1000

## 2017-12-26 MED ORDER — FENTANYL CITRATE (PF) 250 MCG/5ML IJ SOLN
INTRAMUSCULAR | Status: AC
  Filled 2017-12-26: qty 5

## 2017-12-26 MED ORDER — MIDAZOLAM HCL 2 MG/2ML IJ SOLN
INTRAMUSCULAR | Status: AC
  Filled 2017-12-26: qty 2

## 2017-12-26 MED ORDER — MIDAZOLAM HCL 2 MG/2ML IJ SOLN
INTRAMUSCULAR | Status: DC | PRN
  Administered 2017-12-26: 2 mg via INTRAVENOUS

## 2017-12-26 MED ORDER — SUGAMMADEX SODIUM 200 MG/2ML IV SOLN
INTRAVENOUS | Status: DC | PRN
  Administered 2017-12-26: 200 mg via INTRAVENOUS

## 2017-12-26 MED ORDER — OXYCODONE-ACETAMINOPHEN 5-325 MG PO TABS: 1.0000 | ORAL_TABLET | Freq: Four times a day (QID) | ORAL | 0 refills | Status: DC | PRN

## 2017-12-26 MED ORDER — FENTANYL CITRATE (PF) 250 MCG/5ML IJ SOLN
INTRAMUSCULAR | Status: DC | PRN
  Administered 2017-12-26: 50 ug via INTRAVENOUS

## 2017-12-26 MED ORDER — LIDOCAINE 2% (20 MG/ML) 5 ML SYRINGE
INTRAMUSCULAR | Status: AC
  Filled 2017-12-26: qty 5

## 2017-12-26 MED ORDER — PHENYLEPHRINE HCL 10 MG/ML IJ SOLN
INTRAMUSCULAR | Status: AC
  Filled 2017-12-26: qty 1

## 2017-12-26 MED ORDER — ONDANSETRON HCL 4 MG/2ML IJ SOLN
INTRAMUSCULAR | Status: DC | PRN
  Administered 2017-12-26: 4 mg via INTRAVENOUS

## 2017-12-26 MED ORDER — LIDOCAINE 2% (20 MG/ML) 5 ML SYRINGE
INTRAMUSCULAR | Status: DC | PRN
  Administered 2017-12-26: 40 mg via INTRAVENOUS

## 2017-12-26 MED ORDER — FENTANYL CITRATE (PF) 100 MCG/2ML IJ SOLN
100.0000 ug | Freq: Once | INTRAMUSCULAR | Status: AC
  Administered 2017-12-26: 50 ug via INTRAVENOUS
  Filled 2017-12-26: qty 2

## 2017-12-26 MED ORDER — EPINEPHRINE PF 1 MG/ML IJ SOLN
INTRAMUSCULAR | Status: DC | PRN
  Administered 2017-12-26: 1 mg

## 2017-12-26 MED ORDER — CEFAZOLIN SODIUM-DEXTROSE 2-4 GM/100ML-% IV SOLN
INTRAVENOUS | Status: AC
  Filled 2017-12-26: qty 100

## 2017-12-26 MED ORDER — PHENYLEPHRINE 40 MCG/ML (10ML) SYRINGE FOR IV PUSH (FOR BLOOD PRESSURE SUPPORT)
PREFILLED_SYRINGE | INTRAVENOUS | Status: DC | PRN
  Administered 2017-12-26: 80 ug via INTRAVENOUS
  Administered 2017-12-26: 120 ug via INTRAVENOUS
  Administered 2017-12-26: 80 ug via INTRAVENOUS

## 2017-12-26 MED ORDER — HYDROMORPHONE HCL 1 MG/ML IJ SOLN
0.2500 mg | INTRAMUSCULAR | Status: DC | PRN
  Filled 2017-12-26: qty 0.5

## 2017-12-26 MED ORDER — MIDAZOLAM HCL 2 MG/2ML IJ SOLN
2.0000 mg | Freq: Once | INTRAMUSCULAR | Status: AC
  Administered 2017-12-26: 1 mg via INTRAVENOUS
  Filled 2017-12-26: qty 2

## 2017-12-26 MED ORDER — SCOPOLAMINE 1 MG/3DAYS TD PT72
1.0000 | MEDICATED_PATCH | Freq: Once | TRANSDERMAL | Status: DC
  Administered 2017-12-26: 1.5 mg via TRANSDERMAL
  Filled 2017-12-26: qty 1

## 2017-12-26 MED ORDER — ONDANSETRON HCL 4 MG/2ML IJ SOLN
INTRAMUSCULAR | Status: AC
  Filled 2017-12-26: qty 2

## 2017-12-26 MED ORDER — PHENYLEPHRINE HCL 10 MG/ML IJ SOLN
INTRAVENOUS | Status: DC | PRN
  Administered 2017-12-26: 50 ug/min via INTRAVENOUS

## 2017-12-26 MED ORDER — PROPOFOL 10 MG/ML IV BOLUS
INTRAVENOUS | Status: AC
  Filled 2017-12-26: qty 20

## 2017-12-26 MED ORDER — ROCURONIUM BROMIDE 100 MG/10ML IV SOLN
INTRAVENOUS | Status: DC | PRN
  Administered 2017-12-26: 50 mg via INTRAVENOUS
  Administered 2017-12-26: 10 mg via INTRAVENOUS

## 2017-12-26 MED ORDER — CEFAZOLIN SODIUM-DEXTROSE 2-4 GM/100ML-% IV SOLN
2.0000 g | INTRAVENOUS | Status: AC
  Administered 2017-12-26: 2 g via INTRAVENOUS
  Filled 2017-12-26: qty 100

## 2017-12-26 MED ORDER — BUPIVACAINE-EPINEPHRINE (PF) 0.5% -1:200000 IJ SOLN
INTRAMUSCULAR | Status: DC | PRN
  Administered 2017-12-26: 30 mL via PERINEURAL

## 2017-12-26 MED ORDER — SCOPOLAMINE 1 MG/3DAYS TD PT72
MEDICATED_PATCH | TRANSDERMAL | Status: AC
  Filled 2017-12-26: qty 1

## 2017-12-26 MED ORDER — PROMETHAZINE HCL 25 MG/ML IJ SOLN
6.2500 mg | INTRAMUSCULAR | Status: DC | PRN
  Filled 2017-12-26: qty 1

## 2017-12-26 MED ORDER — DEXAMETHASONE SODIUM PHOSPHATE 10 MG/ML IJ SOLN
INTRAMUSCULAR | Status: AC
  Filled 2017-12-26: qty 1

## 2017-12-26 MED ORDER — MIDAZOLAM HCL 2 MG/2ML IJ SOLN
0.5000 mg | Freq: Once | INTRAMUSCULAR | Status: DC | PRN
  Filled 2017-12-26: qty 2

## 2017-12-26 MED ORDER — TIZANIDINE HCL 2 MG PO TABS: 2.0000 mg | ORAL_TABLET | Freq: Three times a day (TID) | ORAL | 0 refills | Status: DC | PRN

## 2017-12-26 MED ORDER — ROCURONIUM BROMIDE 100 MG/10ML IV SOLN
INTRAVENOUS | Status: AC
  Filled 2017-12-26: qty 1

## 2017-12-26 SURGICAL SUPPLY — 85 items
ANCH SUT 2 VT CRKSW 14.7X5.5 (Anchor) ×2 IMPLANT
ANCH SUT SWLK 19.1X4.75 VT (Anchor) ×2 IMPLANT
ANCHOR CORKSCREW BIO 5.5 (Anchor) ×2 IMPLANT
ANCHOR CORKSCREW BIO 5.5MM (Anchor) ×2 IMPLANT
ANCHOR PEEK 4.75X19.1 SWLK C (Anchor) ×4 IMPLANT
APL SKNCLS STERI-STRIP NONHPOA (GAUZE/BANDAGES/DRESSINGS)
BENZOIN TINCTURE PRP APPL 2/3 (GAUZE/BANDAGES/DRESSINGS) IMPLANT
BLADE 4.2CUDA (BLADE) ×2 IMPLANT
BLADE SURG 15 STRL LF DISP TIS (BLADE) IMPLANT
BLADE SURG 15 STRL SS (BLADE) ×3
BLADE VORTEX 6.0 (BLADE) ×3 IMPLANT
BUR VERTEX HOODED 4.5 (BURR) ×2 IMPLANT
CANNULA 5.75X71 LONG (CANNULA) IMPLANT
CANNULA TWIST IN 8.25X7CM (CANNULA) IMPLANT
CLOSURE WOUND 1/2 X4 (GAUZE/BANDAGES/DRESSINGS)
CUTTER MENISCUS  4.2MM (BLADE) ×2
CUTTER MENISCUS 4.2MM (BLADE) ×1 IMPLANT
DECANTER SPIKE VIAL GLASS SM (MISCELLANEOUS) IMPLANT
DRAPE INCISE IOBAN 66X45 STRL (DRAPES) ×3 IMPLANT
DRAPE STERI 35X30 U-POUCH (DRAPES) ×3 IMPLANT
DRAPE SURG 17X23 STRL (DRAPES) ×3 IMPLANT
DRAPE U-SHAPE 47X51 STRL (DRAPES) ×3 IMPLANT
DRAPE U-SHAPE 76X120 STRL (DRAPES) ×6 IMPLANT
DRSG EMULSION OIL 3X3 NADH (GAUZE/BANDAGES/DRESSINGS) ×3 IMPLANT
DRSG PAD ABDOMINAL 8X10 ST (GAUZE/BANDAGES/DRESSINGS) ×6 IMPLANT
DURAPREP 26ML APPLICATOR (WOUND CARE) ×3 IMPLANT
ELECT REM PT RETURN 9FT ADLT (ELECTROSURGICAL) ×3
ELECTRODE REM PT RTRN 9FT ADLT (ELECTROSURGICAL) ×1 IMPLANT
GAUZE SPONGE 4X4 12PLY STRL (GAUZE/BANDAGES/DRESSINGS) ×3 IMPLANT
GLOVE BIOGEL PI IND STRL 8 (GLOVE) ×2 IMPLANT
GLOVE BIOGEL PI INDICATOR 8 (GLOVE) ×4
GLOVE ECLIPSE 7.5 STRL STRAW (GLOVE) ×9 IMPLANT
GOWN STRL REUS W/ TWL LRG LVL3 (GOWN DISPOSABLE) ×1 IMPLANT
GOWN STRL REUS W/ TWL XL LVL3 (GOWN DISPOSABLE) ×1 IMPLANT
GOWN STRL REUS W/TWL LRG LVL3 (GOWN DISPOSABLE) ×3
GOWN STRL REUS W/TWL XL LVL3 (GOWN DISPOSABLE) ×6 IMPLANT
IV NS IRRIG 3000ML ARTHROMATIC (IV SOLUTION) ×8 IMPLANT
MANIFOLD NEPTUNE II (INSTRUMENTS) ×3 IMPLANT
NDL 1/2 CIR CATGUT .05X1.09 (NEEDLE) IMPLANT
NDL HYPO 18GX1.5 BLUNT FILL (NEEDLE) ×1 IMPLANT
NDL SCORPION MULTI FIRE (NEEDLE) IMPLANT
NDL SUT 6 .5 CRC .975X.05 MAYO (NEEDLE) IMPLANT
NEEDLE 1/2 CIR CATGUT .05X1.09 (NEEDLE) IMPLANT
NEEDLE HYPO 18GX1.5 BLUNT FILL (NEEDLE) ×3 IMPLANT
NEEDLE MAYO TAPER (NEEDLE) ×3
NEEDLE SCORPION MULTI FIRE (NEEDLE) ×3 IMPLANT
NS IRRIG 1000ML POUR BTL (IV SOLUTION) ×2 IMPLANT
PACK ARTHROSCOPY DSU (CUSTOM PROCEDURE TRAY) ×3 IMPLANT
PACK BASIN DAY SURGERY FS (CUSTOM PROCEDURE TRAY) ×3 IMPLANT
PAD ABD 8X10 STRL (GAUZE/BANDAGES/DRESSINGS) ×2 IMPLANT
PASSER SUT SWANSON 36MM LOOP (INSTRUMENTS) ×2 IMPLANT
PENCIL BUTTON HOLSTER BLD 10FT (ELECTRODE) ×2 IMPLANT
PROBE BIPOLAR ATHRO 135MM 90D (MISCELLANEOUS) ×3 IMPLANT
SET IRRIG Y TYPE TUR BLADDER L (SET/KITS/TRAYS/PACK) ×3 IMPLANT
SLEEVE SCD COMPRESS KNEE MED (MISCELLANEOUS) ×3 IMPLANT
SLING ARM FOAM STRAP LRG (SOFTGOODS) ×2 IMPLANT
SLING ARM IMMOBILIZER LRG (SOFTGOODS) IMPLANT
SLING ARM MED ADULT FOAM STRAP (SOFTGOODS) IMPLANT
SLING ARM XL FOAM STRAP (SOFTGOODS) IMPLANT
SPONGE LAP 4X18 RFD (DISPOSABLE) ×2 IMPLANT
STRIP CLOSURE SKIN 1/2X4 (GAUZE/BANDAGES/DRESSINGS) IMPLANT
SUCTION FRAZIER HANDLE 10FR (MISCELLANEOUS)
SUCTION TUBE FRAZIER 10FR DISP (MISCELLANEOUS) IMPLANT
SUT ETHIBOND 2 OS 4 DA (SUTURE) IMPLANT
SUT ETHILON 4 0 PS 2 18 (SUTURE) IMPLANT
SUT MNCRL AB 3-0 PS2 18 (SUTURE) IMPLANT
SUT PDS AB 0 CT 36 (SUTURE) IMPLANT
SUT TICRON 1 T 12 (SUTURE) IMPLANT
SUT TIGER TAPE 7 IN WHITE (SUTURE) ×2 IMPLANT
SUT VIC AB 0 CT1 27 (SUTURE) ×3
SUT VIC AB 0 CT1 27XBRD ANBCTR (SUTURE) IMPLANT
SUT VIC AB 1 CT1 27 (SUTURE)
SUT VIC AB 1 CT1 27XBRD ANBCTR (SUTURE) IMPLANT
SUT VIC AB 2-0 SH 27 (SUTURE)
SUT VIC AB 2-0 SH 27XBRD (SUTURE) IMPLANT
SYR 5ML LL (SYRINGE) ×3 IMPLANT
SYR BULB IRRIGATION 50ML (SYRINGE) ×2 IMPLANT
TAPE CLOTH SURG 6X10 WHT LF (GAUZE/BANDAGES/DRESSINGS) ×2 IMPLANT
TAPE FIBER 2MM 7IN #2 BLUE (SUTURE) ×2 IMPLANT
TOWEL OR 17X24 6PK STRL BLUE (TOWEL DISPOSABLE) ×3 IMPLANT
TOWEL OR NON WOVEN STRL DISP B (DISPOSABLE) ×3 IMPLANT
TUBE CONNECTING 12'X1/4 (SUCTIONS) ×1
TUBE CONNECTING 12X1/4 (SUCTIONS) ×1 IMPLANT
WATER STERILE IRR 1000ML POUR (IV SOLUTION) ×3 IMPLANT
YANKAUER SUCT BULB TIP NO VENT (SUCTIONS) ×2 IMPLANT

## 2017-12-26 NOTE — Anesthesia Procedure Notes (Signed)
Anesthesia Regional Block: Interscalene brachial plexus block   Pre-Anesthetic Checklist: ,, timeout performed, Correct Patient, Correct Site, Correct Laterality, Correct Procedure, Correct Position, site marked, Risks and benefits discussed,  Surgical consent,  Pre-op evaluation,  At surgeon's request and post-op pain management  Laterality: Left and Upper  Prep: Betadine       Needles:  Injection technique: Single-shot  Needle Type: Echogenic Needle     Needle Length: 9cm  Needle Gauge: 21     Additional Needles:   Procedures:, nerve stimulator,,, ultrasound used (permanent image in chart),,,,   Nerve Stimulator or Paresthesia:  Response: hand twitch, 0.4 mA, 0.1 ms,   Additional Responses:   Narrative:  Start time: 12/26/2017 7:19 AM End time: 12/26/2017 7:28 AM Injection made incrementally with aspirations every 5 mL.  Performed by: Personally  Anesthesiologist: Annye Asa, MD  Additional Notes: Pt identified in Holding room.  Monitors applied. Working IV access confirmed. Sterile prep, drape L neck.  #21ga ECHOgenic PNS to twitch at 0.41mA threshold with US guidance.  30cc 0.5% Bupivacaine with 1:200k epi injected incrementally after negative test dose.  Patient asymptomatic, VSS, no heme aspirated, tolerated well.  Jenita Seashore, MD

## 2017-12-26 NOTE — Op Note (Signed)
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  POSTOPERATIVE DIAGNOSES:   1.  Impingement.   2.  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   3.  Impending biceps tendon rupture  4.  Rotator cuff tear of the supraspinatus.  PRINCIPAL PROCEDURE: 1.  Mini open rotator cuff repair with a double row technique with the medial row having 4 limbs of FiberTape to a lateral row. 2.  Open biceps tenodesis with a 5.5 corkscrew anchor and 2 FiberWire sutures. 3.  Arthroscopic subacromial decompression. 4.  Arthroscopic distal clavicle resection. 5.  Arthroscopic debridement of the glenohumeral joint including humeral head, glenoid, and anterior and superior labrum.  SURGEON:  Dorna Leitz, MD  ASSISTANT:  Talbert Nan, PA.  ANESTHESIA:  General.  BRIEF HISTORY:  The patient is a 37 year old female with a long history significant complaints of left shoulder pain.  She has been treated conservatively for a prolonged period of time.  MRI was obtained which showed partial thickness cuff tear and  impingement and AC joint arthritis.  I had a long look at her rotator cuff and it was not suspecting a rotator cuff tear given the glenohumeral changes but was certainly concerned about the biceps tendon going into the case.  She was brought to the  operating room for evaluation, arthroscopic management and other as needed.  DESCRIPTION OF PROCEDURE:  The patient brought to the operating room after adequate anesthesia obtained with general anesthetic.  The patient was placed supine on the operating table and moved into the beach chair position.  All bony prominences were  well  padded.  Attention was then turned to the left shoulder.  After this, routine prep and drape was performed.  Attention was turned to the left shoulder where routine arthroscopic examination revealed that there was fairly significant degeneration on  the humeral head.  There were some mild changes in the glenoid.  She had an anterior labral tear which we debrided.  I went up to the biceps tendon.  The superior labrum was torn.  The biceps tendon was not anchored well on the undersurface, but I felt  like it was okay from the top surface.  We followed the biceps tendon down.  Unfortunately, there was significant fray out laterally and impending rupture of the biceps tendon.  More unfortunately was that we looked at the supraspinatus, there was a  super high grade flap tear of the leading edge of the supraspinatus.  We debrided this and it was very clear.  This was essentially an impending rupture of the rotator cuff as well.  At this point, we released the biceps tendon, cleaned up the superior  labrum, anterior labrum, debrided the humeral head and glenoid and then went out of the glenohumeral joint into the subacromial space.  We did an anterolateral acromioplasty from the lateral and posterior compartment,  did a distal clavicle resection  over 20 mm in the anterior compartment.  At this point, did a thorough debridement of the bursa and other tissues in the shoulder.  At this point, we removed the arthroscope and made a small lateral incision, subcutaneous tissue down to level of the  deltoid.  Deltoid fibers opened and retractor put in place.  The bicipital groove was identified.  A small longitudinal incision was made and we got the biceps tendon just posterior to the biceps tendon where she was torn.  We used a probe and you could  really feel the thinned area.  We made a small incision in the thinned area and then extended this with a rongeur proximally and distally to the extent of the super thinned  area.  Once we did this, I felt like a single limb was not enough, so we put 4  limbs of 2 FiberTapes into a medial row and brought the 4 limbs out to a single lateral anchor point, got excellent fixation and compression across the bleeding bone and then cut these sutures.  Turned to the biceps tendon and made an anchor in the  bicipital groove.  Put a 5.5 Bio-Corkscrew in there and then used the 2 sutures to make multiple passes through the biceps tendon and then pulled it down into the groove and locked it in place.  Once this was done, the wound was irrigated thoroughly,  suctioned dry and then we closed the deltoid with 0 Vicryl running, skin with 0 and 2-0 Vicryl and then 3-0 Monocryl subcuticular,  Benzoin and Steri-Strips applied.  Dry sterile compressive dressing was applied.    The patient taken to recovery and was noted to be in satisfactory condition.    Estimated blood loss for procedure was minimal.  AN/NUANCE  D:12/26/2017 T:12/26/2017 JOB:[id number]

## 2017-12-26 NOTE — H&P (Signed)
PREOPERATIVE H&P  Chief Complaint: l shoulder pain  HPI: Jill Baker is a 37 y.o. female who presents for evaluation of l shoulder pain. It has been present for greater than 3 months and has been worsening. She has failed conservative measures. Pain is rated as moderate.  Past Medical History:  Diagnosis Date  . Acid reflux   . Cervical adenopathy    Pt denies blood dyscrasia  . Colon polyps    hyperplastic  . Diverticulosis   . Esophageal stricture   . Fatty liver   . GERD (gastroesophageal reflux disease)   . Hand foot syndrome   . Hiatal hernia   . Hypothyroid   . PONV (postoperative nausea and vomiting)   . Tachycardia    controlled on metoprolol  . Tongue ulcer    Past Surgical History:  Procedure Laterality Date  . Cervical lymph node Biopsy  02/24/2011   reactive only, per Dr. Georganna Skeans  . CESAREAN SECTION N/A 08/16/2012   Procedure: CESAREAN SECTION;  Surgeon: Elveria Royals, MD;  Location: [facility];  Service: Obstetrics;  Laterality: N/A;  . CHOLECYSTECTOMY    . COLONOSCOPY W/ POLYPECTOMY  06/08/2009  . FOOT SURGERY    . LAPAROSCOPIC GASTRIC SLEEVE RESECTION WITH HIATAL HERNIA REPAIR N/A 01/22/2017   Procedure: LAPAROSCOPIC GASTRIC SLEEVE RESECTION WITH HIATAL HERNIA REPAIR;  Surgeon: Greer Pickerel, MD;  Location: [facility];  Service: General;  Laterality: N/A;  . TONGUE BIOPSY    . TONSILLECTOMY    . UPPER GI ENDOSCOPY  01/22/2017   Procedure: UPPER GI ENDOSCOPY;  Surgeon: Greer Pickerel, MD;  Location: [facility];  Service: General;;  . UPPER GI ENDOSCOPY  06/08/2009   Social History   Socioeconomic History  . Marital status: Single    Spouse name: Not on file  . Number of children: Not on file  . Years of education: Not on file  . Highest education level: Not on file  Occupational History  . Not on file  Social Needs  . Financial resource strain: Not on file  . Food insecurity:    Worry: Not on file    Inability: Not on file  . Transportation needs:     Medical: Not on file    Non-medical: Not on file  Tobacco Use  . Smoking status: Never Smoker  . Smokeless tobacco: Never Used  Substance and Sexual Activity  . Alcohol use: No    Alcohol/week: 0.0 oz  . Drug use: No  . Sexual activity: Not on file  Lifestyle  . Physical activity:    Days per week: Not on file    Minutes per session: Not on file  . Stress: Not on file  Relationships  . Social connections:    Talks on phone: Not on file    Gets together: Not on file    Attends religious service: Not on file    Active member of club or organization: Not on file    Attends meetings of clubs or organizations: Not on file    Relationship status: Not on file  Other Topics Concern  . Not on file  Social History Narrative  . Not on file   Family History  Problem Relation Age of Onset  . Heart disease Mother   . Hypertension Mother   . Breast cancer Maternal Grandmother    Allergies  Allergen Reactions  . Chlorhexidine Hives   Prior to Admission medications   Medication Sig Start Date End Date Taking? Authorizing Provider  atorvastatin (LIPITOR) 20 MG tablet Take 1 tablet (20 mg total) by mouth daily. 12/03/17  Yes Laurey Morale, MD  cetirizine (ZYRTEC) 10 MG tablet Take 10 mg by mouth daily.   Yes [provider]  colchicine 0.6 MG tablet Take 0.3 mg by mouth every evening.  09/18/17  Yes [provider]  levothyroxine (SYNTHROID, LEVOTHROID) 100 MCG tablet Take 1 tablet (100 mcg total) by mouth daily. 12/03/17  Yes Laurey Morale, MD  metoprolol succinate (TOPROL-XL) 100 MG 24 hr tablet Take 1 tablet (100 mg total) by mouth daily. Take with or immediately following a meal. 11/29/17  Yes Laurey Morale, MD  omeprazole (PRILOSEC) 40 MG capsule Take 1 capsule (40 mg total) by mouth daily. 11/29/17  Yes Laurey Morale, MD  sertraline (ZOLOFT) 25 MG tablet Take 1 tablet (25 mg total) at bedtime by mouth. 05/11/17  Yes Laurey Morale, MD  ibuprofen (ADVIL,MOTRIN) 200  MG tablet Take 600 mg by mouth every 8 (eight) hours as needed for headache.    [provider]     Positive ROS: none  All other systems have been reviewed and were otherwise negative with the exception of those mentioned in the HPI and as above.  Physical Exam: Vitals:   12/26/17 0735 12/26/17 0740  BP: 108/74 106/74  Pulse:    Resp:    Temp:    SpO2:      General: Alert, no acute distress Cardiovascular: No pedal edema Respiratory: No cyanosis, no use of accessory musculature GI: No organomegaly, abdomen is soft and non-tender Skin: No lesions in the area of chief complaint Neurologic: Sensation intact distally Psychiatric: Patient is competent for consent with normal mood and affect Lymphatic: No axillary or cervical lymphadenopathy  MUSCULOSKELETAL: l shoulder: +impingement  -instability/ ttp lateral shoulder  Assessment/Plan: LEFT SHOULDER IMPINGEMENT WITH GLENOHUMERAL INFLAMMATION Plan for Procedure(s): ARTHROSCOPY LEFT SHOULDER  The risks benefits and alternatives were discussed with the patient including but not limited to the risks of nonoperative treatment, versus surgical intervention including infection, bleeding, nerve injury, malunion, nonunion, hardware prominence, hardware failure, need for hardware removal, blood clots, cardiopulmonary complications, morbidity, mortality, among others, and they were willing to proceed.  Predicted outcome is good, although there will be at least a six to nine month expected recovery.  Alta Corning, MD 12/26/2017 7:41 AM

## 2017-12-26 NOTE — Anesthesia Procedure Notes (Signed)
Procedure Name: Intubation Date/Time: 12/26/2017 7:51 AM Performed by: Raenette Rover, CRNA Pre-anesthesia Checklist: Patient identified, Emergency Drugs available, Suction available and Patient being monitored Patient Re-evaluated:Patient Re-evaluated prior to induction Oxygen Delivery Method: Circle system utilized Preoxygenation: Pre-oxygenation with 100% oxygen Induction Type: IV induction Ventilation: Mask ventilation without difficulty Laryngoscope Size: Mac and 3 Grade View: Grade I Tube type: Oral Tube size: 7.0 mm Number of attempts: 1 Airway Equipment and Method: Stylet Placement Confirmation: ETT inserted through vocal cords under direct vision,  positive ETCO2,  CO2 detector and breath sounds checked- equal and bilateral Secured at: 21 cm Tube secured with: Tape Dental Injury: Teeth and Oropharynx as per pre-operative assessment

## 2017-12-26 NOTE — Brief Op Note (Signed)
12/26/2017  9:53 AM  PATIENT:  Jill Baker  37 y.o. female  PRE-OPERATIVE DIAGNOSIS:  LEFT SHOULDER IMPINGEMENT WITHGLENOHUMERAL INFLAMMATION  POST-OPERATIVE DIAGNOSIS:  LEFT SHOULDER IMPINGEMENT WITHGLENOHUMERAL INFLAMMATION, ROTATOR CUFF TEAR  PROCEDURE:  Procedure(s): ARTHROSCOPY LEFT SHOULDER ROTATOR CUFF REPAIR AND BICEPS TENODESIS (Left)  SURGEON:  Surgeon(s) and Role:    Dorna Leitz, MD - Primary  PHYSICIAN ASSISTANT:   ASSISTANTS: bethune   ANESTHESIA:   general  EBL:  20 mL   BLOOD ADMINISTERED:none  DRAINS: none   LOCAL MEDICATIONS USED:  NONE  SPECIMEN:  No Specimen  DISPOSITION OF SPECIMEN:  N/A  COUNTS:  YES  TOURNIQUET:  * No tourniquets in log *  DICTATION: .Other Dictation: Dictation Number [id number]  PLAN OF CARE: Discharge to home after PACU  PATIENT DISPOSITION:  PACU - hemodynamically stable.   Delay start of Pharmacological VTE agent (>24hrs) due to surgical blood loss or risk of bleeding: no

## 2017-12-26 NOTE — Discharge Instructions (Signed)
Discharge Instructions after Arthroscopic Shoulder Repair   A sling has been provided for you. Remain in your sling at all times. This includes sleeping in your sling.  Use ice on the shoulder intermittently over the first 48 hours after surgery.  Pain medicine has been prescribed for you.  Use your medicine liberally over the first 48 hours, and then you can begin to taper your use. You may take Extra Strength Tylenol or Tylenol only in place of the pain pills. DO NOT take ANY nonsteroidal anti-inflammatory pain medications: Advil, Motrin, Ibuprofen, Aleve, Naproxen, or Narprosyn.  You may remove your dressing after two days. If the incision sites are still moist, place a Band-Aid over the moist site(s). Change Band-Aids daily until dry.  You may shower 5 days after surgery. The incisions CANNOT get wet prior to 5 days. Simply allow the water to wash over the site and then pat dry. Do not rub the incisions. Make sure your axilla (armpit) is completely dry after showering.  Take one aspirin a day for 2 weeks after surgery, unless you have an aspirin sensitivity/ allergy or asthma.   Please call [phone] during normal business hours or [phone] after hours for any problems. Including the following:  - excessive redness of the incisions - drainage for more than 4 days - fever of more than 101.5 F  *Please note that pain medications will not be refilled after hours or on weekends.   Post Anesthesia Home Care Instructions  Activity: Get plenty of rest for the remainder of the day. A responsible individual must stay with you for 24 hours following the procedure.  For the next 24 hours, DO NOT: -Drive a car -Paediatric nurse -Drink alcoholic beverages -Take any medication unless instructed by your physician -Make any legal decisions or sign important papers.  Meals: Start with liquid foods such as gelatin or soup. Progress to regular foods as tolerated. Avoid greasy, spicy, heavy  foods. If nausea and/or vomiting occur, drink only clear liquids until the nausea and/or vomiting subsides. Call your physician if vomiting continues.  Special Instructions/Symptoms: Your throat may feel dry or sore from the anesthesia or the breathing tube placed in your throat during surgery. If this causes discomfort, gargle with warm salt water. The discomfort should disappear within 24 hours.  If you had a scopolamine patch placed behind your ear for the management of post- operative nausea and/or vomiting:  1. The medication in the patch is effective for 72 hours, after which it should be removed.  Wrap patch in a tissue and discard in the trash. Wash hands thoroughly with soap and water. 2. You may remove the patch earlier than 72 hours if you experience unpleasant side effects which may include dry mouth, dizziness or visual disturbances. 3. Avoid touching the patch. Wash your hands with soap and water after contact with the patch.  Shoulder Arthroscopy, Care After Refer to this sheet in the next few weeks. These instructions provide you with information on caring for yourself after your procedure. Your health care provider may also give you more specific instructions. Your treatment has been planned according to current medical practices, but problems sometimes occur. Call your health care provider if you have any problems or questions after your procedure. What can I expect after the procedure? After your procedure, it is typical to have the following:  Pain at the site of the surgical cuts (incisions).  Stiffness in your shoulder. This should gradually decrease over time. Your health care  provider may recommend physical therapy to help improve this.  Nausea, vomiting, or constipation. These symptoms can result from taking pain medicine after surgery.  Clear or red drainage from the incision sites. This is normal for a few days after surgery.  Fatigue.  Follow these instructions  at home:  Take medicines only as directed by your health care provider.  Use a sling as directed.  There are many different ways to close and cover an incision, including stitches, skin glue, and adhesive strips. Follow your health care provider's instructions on: ? Incision care. ? Bandage (dressing) changes and removal. ? Incision closure removal.  Apply ice to the injured area: ? Put ice in a plastic bag. ? Place a towel between your skin and the bag. ? Leave the ice on for 20 minutes, 2-3 times a day.  If physical therapy and exercises are prescribed by your health care provider, do them as directed.  Keep all follow-up visits as directed by your health care provider. This is important. Contact a health care provider if: You have a fever. Get help right away if:  You have drainage, redness, swelling, or increasing pain at the incision site.  You notice a bad smell coming from the incision site or dressing.  Your incision site breaks open after your stitches or tape has been removed. This information is not intended to replace advice given to you by your health care provider. Make sure you discuss any questions you have with your health care provider. Document Released: 01/14/2014 Document Revised: 02/13/2016 Document Reviewed: 08/08/2013 Elsevier Interactive Patient Education  [employer].

## 2017-12-26 NOTE — Anesthesia Postprocedure Evaluation (Signed)
Anesthesia Post Note  Patient: TRENIKA HUDSON  Procedure(s) Performed: ARTHROSCOPY LEFT SHOULDER ROTATOR CUFF REPAIR AND BICEPS TENODESIS, ACROMIIOPLASTY AND DISTAL CLAVICLE RESECTION (Left Shoulder)     Patient location during evaluation: PACU Anesthesia Type: General and Regional Level of consciousness: awake and alert, oriented and patient cooperative Pain management: pain level controlled Vital Signs Assessment: post-procedure vital signs reviewed and stable Respiratory status: spontaneous breathing, nonlabored ventilation and respiratory function stable Cardiovascular status: blood pressure returned to baseline and stable Postop Assessment: no apparent nausea or vomiting Anesthetic complications: no    Last Vitals:  Vitals:   12/26/17 1100 12/26/17 1147  BP: 123/75 101/66  Pulse: 63 67  Resp: 17 16  Temp:  36.7 C  SpO2: 98% 100%    Last Pain:  Vitals:   12/26/17 1147  TempSrc:   PainSc: 0-No pain                 Makiah Clauson,E. Chayla Shands

## 2017-12-26 NOTE — Transfer of Care (Signed)
Immediate Anesthesia Transfer of Care Note  Patient: Jill Baker  Procedure(s) Performed: ARTHROSCOPY LEFT SHOULDER ROTATOR CUFF REPAIR AND BICEPS TENODESIS, ACROMIIOPLASTY AND DISTAL CLAVICLE RESECTION (Left Shoulder)  Patient Location: PACU  Anesthesia Type:GA combined with regional for post-op pain  Level of Consciousness: awake, alert , oriented and patient cooperative  Airway & Oxygen Therapy: Patient Spontanous Breathing and Patient connected to nasal cannula oxygen  Post-op Assessment: Report given to RN and Post -op Vital signs reviewed and stable  Post vital signs: Reviewed and stable  Last Vitals:  Vitals Value Taken Time  BP    Temp    Pulse    Resp    SpO2      Last Pain:  Vitals:   12/26/17 0552  TempSrc: Oral         Complications: No apparent anesthesia complications

## 2017-12-27 ENCOUNTER — Encounter (HOSPITAL_BASED_OUTPATIENT_CLINIC_OR_DEPARTMENT_OTHER): Payer: Self-pay | Admitting: Orthopedic Surgery

## 2017-12-27 DIAGNOSIS — Z9889 Other specified postprocedural states: Secondary | ICD-10-CM | POA: Diagnosis not present

## 2017-12-31 DIAGNOSIS — Z4789 Encounter for other orthopedic aftercare: Secondary | ICD-10-CM | POA: Diagnosis not present

## 2017-12-31 DIAGNOSIS — M25512 Pain in left shoulder: Secondary | ICD-10-CM | POA: Diagnosis not present

## 2017-12-31 DIAGNOSIS — M75112 Incomplete rotator cuff tear or rupture of left shoulder, not specified as traumatic: Secondary | ICD-10-CM | POA: Diagnosis not present

## 2017-12-31 MED FILL — COLCHICINE 0.6 MG TABS: 0.6 | 30 days supply | Qty: 60 | Fill #2

## 2018-01-07 DIAGNOSIS — M75112 Incomplete rotator cuff tear or rupture of left shoulder, not specified as traumatic: Secondary | ICD-10-CM | POA: Diagnosis not present

## 2018-01-07 DIAGNOSIS — Z4789 Encounter for other orthopedic aftercare: Secondary | ICD-10-CM | POA: Diagnosis not present

## 2018-01-07 DIAGNOSIS — M25512 Pain in left shoulder: Secondary | ICD-10-CM | POA: Diagnosis not present

## 2018-01-08 DIAGNOSIS — Z9889 Other specified postprocedural states: Secondary | ICD-10-CM | POA: Diagnosis not present

## 2018-01-14 DIAGNOSIS — Z4789 Encounter for other orthopedic aftercare: Secondary | ICD-10-CM | POA: Diagnosis not present

## 2018-01-14 DIAGNOSIS — M75112 Incomplete rotator cuff tear or rupture of left shoulder, not specified as traumatic: Secondary | ICD-10-CM | POA: Diagnosis not present

## 2018-01-14 DIAGNOSIS — M25512 Pain in left shoulder: Secondary | ICD-10-CM | POA: Diagnosis not present

## 2018-01-21 DIAGNOSIS — M75112 Incomplete rotator cuff tear or rupture of left shoulder, not specified as traumatic: Secondary | ICD-10-CM | POA: Diagnosis not present

## 2018-01-21 DIAGNOSIS — Z4789 Encounter for other orthopedic aftercare: Secondary | ICD-10-CM | POA: Diagnosis not present

## 2018-01-21 DIAGNOSIS — M25512 Pain in left shoulder: Secondary | ICD-10-CM | POA: Diagnosis not present

## 2018-01-22 DIAGNOSIS — Z9889 Other specified postprocedural states: Secondary | ICD-10-CM | POA: Diagnosis not present

## 2018-01-29 ENCOUNTER — Telehealth: Payer: 59 | Admitting: Physician Assistant

## 2018-01-29 DIAGNOSIS — J069 Acute upper respiratory infection, unspecified: Secondary | ICD-10-CM | POA: Diagnosis not present

## 2018-01-29 DIAGNOSIS — B9789 Other viral agents as the cause of diseases classified elsewhere: Secondary | ICD-10-CM

## 2018-01-29 MED ORDER — BENZONATATE 100 MG PO CAPS: 100.0000 mg | ORAL_CAPSULE | Freq: Three times a day (TID) | ORAL | 0 refills | Status: DC

## 2018-01-29 NOTE — Progress Notes (Signed)

## 2018-02-01 ENCOUNTER — Ambulatory Visit (INDEPENDENT_AMBULATORY_CARE_PROVIDER_SITE_OTHER): Payer: Self-pay | Admitting: Nurse Practitioner

## 2018-02-01 VITALS — BP 120/70 | HR 94 | Temp 97.9°F | Resp 18 | Wt 208.0 lb

## 2018-02-01 DIAGNOSIS — J069 Acute upper respiratory infection, unspecified: Secondary | ICD-10-CM

## 2018-02-01 MED ORDER — PSEUDOEPH-BROMPHEN-DM 30-2-10 MG/5ML PO SYRP: 5.0000 mL | ORAL_SOLUTION | Freq: Four times a day (QID) | ORAL | 0 refills | Status: AC | PRN

## 2018-02-01 MED ORDER — AZITHROMYCIN 250 MG PO TABS: ORAL_TABLET | ORAL | 0 refills | Status: AC

## 2018-02-01 NOTE — Patient Instructions (Signed)
Upper Respiratory Infection, Adult -Take medications as prescribed -Increase fluids.  \ -Sleep elevated on at least 2 pillows when sleeping. -Use a humidifier or vaporizer. -May use Tylenol or Ibuprofen for pain, fever or general discomfort. -Follow up with PCP if symptoms do not improve.  Most upper respiratory infections (URIs) are a viral infection of the air passages leading to the lungs. A URI affects the nose, throat, and upper air passages. The most common type of URI is nasopharyngitis and is typically referred to as "the common cold." URIs run their course and usually go away on their own. Most of the time, a URI does not require medical attention, but sometimes a bacterial infection in the upper airways can follow a viral infection. This is called a secondary infection. Sinus and middle ear infections are common types of secondary upper respiratory infections. Bacterial pneumonia can also complicate a URI. A URI can worsen asthma and chronic obstructive pulmonary disease (COPD). Sometimes, these complications can require emergency medical care and may be life threatening. What are the causes? Almost all URIs are caused by viruses. A virus is a type of germ and can spread from one person to another. What increases the risk? You may be at risk for a URI if:  You smoke.  You have chronic heart or lung disease.  You have a weakened defense (immune) system.  You are very young or very old.  You have nasal allergies or asthma.  You work in crowded or poorly ventilated areas.  You work in health care facilities or schools.  What are the signs or symptoms? Symptoms typically develop 2-3 days after you come in contact with a cold virus. Most viral URIs last 7-10 days. However, viral URIs from the influenza virus (flu virus) can last 14-18 days and are typically more severe. Symptoms may include:  Runny or stuffy (congested) nose.  Sneezing.  Cough.  Sore  throat.  Headache.  Fatigue.  Fever.  Loss of appetite.  Pain in your forehead, behind your eyes, and over your cheekbones (sinus pain).  Muscle aches.  How is this diagnosed? Your health care provider may diagnose a URI by:  Physical exam.  Tests to check that your symptoms are not due to another condition such as: ? Strep throat. ? Sinusitis. ? Pneumonia. ? Asthma.  How is this treated? A URI goes away on its own with time. It cannot be cured with medicines, but medicines may be prescribed or recommended to relieve symptoms. Medicines may help:  Reduce your fever.  Reduce your cough.  Relieve nasal congestion.  Follow these instructions at home:  Take medicines only as directed by your health care provider.  Gargle warm saltwater or take cough drops to comfort your throat as directed by your health care provider.  Use a warm mist humidifier or inhale steam from a shower to increase air moisture. This may make it easier to breathe.  Drink enough fluid to keep your urine clear or pale yellow.  Eat soups and other clear broths and maintain good nutrition.  Rest as needed.  Return to work when your temperature has returned to normal or as your health care provider advises. You may need to stay home longer to avoid infecting others. You can also use a face mask and careful hand washing to prevent spread of the virus.  Increase the usage of your inhaler if you have asthma.  Do not use any tobacco products, including cigarettes, chewing tobacco, or electronic cigarettes.  If you need help quitting, ask your health care provider. How is this prevented? The best way to protect yourself from getting a cold is to practice good hygiene.  Avoid oral or hand contact with people with cold symptoms.  Wash your hands often if contact occurs.  There is no clear evidence that vitamin C, vitamin E, echinacea, or exercise reduces the chance of developing a cold. However, it is  always recommended to get plenty of rest, exercise, and practice good nutrition. Contact a health care provider if:  You are getting worse rather than better.  Your symptoms are not controlled by medicine.  You have chills.  You have worsening shortness of breath.  You have brown or red mucus.  You have yellow or brown nasal discharge.  You have pain in your face, especially when you bend forward.  You have a fever.  You have swollen neck glands.  You have pain while swallowing.  You have white areas in the back of your throat. Get help right away if:  You have severe or persistent: ? Headache. ? Ear pain. ? Sinus pain. ? Chest pain.  You have chronic lung disease and any of the following: ? Wheezing. ? Prolonged cough. ? Coughing up blood. ? A change in your usual mucus.  You have a stiff neck.  You have changes in your: ? Vision. ? Hearing. ? Thinking. ? Mood. This information is not intended to replace advice given to you by your health care provider. Make sure you discuss any questions you have with your health care provider. Document Released: 12/13/2000 Document Revised: 02/20/2016 Document Reviewed: 09/24/2013 Elsevier Interactive Patient Education  [employer].

## 2018-02-01 NOTE — Progress Notes (Signed)
Subjective:  Jill Baker is a 37 y.o. female who presents for evaluation of URI like symptoms.  Symptoms include fever: fevers up to 102 degrees.  Onset of symptoms was 5 days ago, and has been unchanged since that time.   High risk factors for influenza complications:  none.  She did complete an ED visit on 01/29/2018.  The patient told at that time her conditions were viral and she used Mucinex and ibuprofen.  The patient states her fever has been persistent for the last 5 days, with relief only from ibuprofen.  The following portions of the patient's history were reviewed and updated as appropriate:  allergies, current medications and past medical history.  Constitutional: positive for chills, fatigue and fevers, negative for anorexia and sweats Eyes: negative Ears, nose, mouth, throat, and face: negative Respiratory: positive for cough and sputum, negative for asthma, dyspnea on exertion, pneumonia, stridor and wheezing Cardiovascular: negative Gastrointestinal: negative Neurological: negative Allergic/Immunologic: negative Objective:  BP 120/70 (BP Location: Right Arm, Patient Position: Sitting, Cuff Size: Normal)   Pulse 94   Temp 97.9 F (36.6 C) (Oral)   Resp 18   Wt 208 lb (94.3 kg)   SpO2 97%   BMI 29.01 kg/m   Physical Exam  Constitutional: She is oriented to person, place, and time. She appears well-developed and well-nourished. No distress.  HENT:  Head: Normocephalic and atraumatic.  Right Ear: External ear normal.  Left Ear: External ear normal.  Nose: Nose normal.  Mouth/Throat: No oropharyngeal exudate.  Eyes: Pupils are equal, round, and reactive to light. Conjunctivae and EOM are normal.  Neck: Normal range of motion. Neck supple. No tracheal deviation present. No thyromegaly present.  Cardiovascular: Normal rate, regular rhythm and normal heart sounds.  Pulmonary/Chest: Effort normal and breath sounds normal. No respiratory distress. She has no wheezes. She  has no rales.  Abdominal: Soft. Bowel sounds are normal.  Neurological: She is alert and oriented to person, place, and time.  Skin: Skin is warm and dry.  Psychiatric: She has a normal mood and affect. Her behavior is normal.     Assessment:  Acute Upper Respiratory Infection    Plan:  Exam findings, diagnosis etiology and medication use and indications reviewed with patient. Follow- Up and discharge instructions provided. No emergent/urgent issues found on exam.  There is a concern for the patient's symptoms as her fever has been persistent for 5 days.  Discussed with patient, and she was in agreement to start an antibiotic.Patient verbalized understanding of information provided and agrees with plan of care (POC), all questions answered.  1. Acute upper respiratory infection  - azithromycin (ZITHROMAX) 250 MG tablet; Take as directed.  Dispense: 6 tablet; Refill: 0 - brompheniramine-pseudoephedrine-DM 30-2-10 MG/5ML syrup; Take 5 mLs by mouth 4 (four) times daily as needed for up to 7 days.  Dispense: 150 mL; Refill: 0 -Take medications as prescribed -Increase fluids.   -Sleep elevated on at least 2 pillows when sleeping. -Use a humidifier or vaporizer. -May use Tylenol or Ibuprofen for pain, fever or general discomfort. -Follow up with PCP if symptoms do not improve.

## 2018-02-04 ENCOUNTER — Telehealth: Payer: Self-pay

## 2018-02-04 NOTE — Telephone Encounter (Signed)
Patient states she is feeling good.

## 2018-02-12 MED FILL — SERTRALINE HCL 25 MG TABLET: 25 | 90 days supply | Qty: 90 | Fill #3

## 2018-03-05 ENCOUNTER — Other Ambulatory Visit: Payer: Self-pay | Admitting: Family Medicine

## 2018-03-05 MED FILL — COLCHICINE 0.6 MG TABS: 0.6 | 30 days supply | Qty: 60 | Fill #3

## 2018-03-05 MED FILL — METOPROLOL SUCCINATE ER 100: 100 | 90 days supply | Qty: 90 | Fill #1

## 2018-03-05 MED FILL — ATORVASTATIN CALCIUM 20 MG: 20 | 90 days supply | Qty: 90 | Fill #1

## 2018-03-05 MED FILL — OMEPRAZOLE 40 MG CPDR: 40 | 90 days supply | Qty: 90 | Fill #1

## 2018-03-05 MED FILL — LEVOTHYROXINE 100 MCG TABLE: 100 | 90 days supply | Qty: 90 | Fill #1

## 2018-03-13 ENCOUNTER — Telehealth: Payer: 59 | Admitting: Family

## 2018-03-13 DIAGNOSIS — S80862A Insect bite (nonvenomous), left lower leg, initial encounter: Secondary | ICD-10-CM | POA: Diagnosis not present

## 2018-03-13 DIAGNOSIS — W57XXXA Bitten or stung by nonvenomous insect and other nonvenomous arthropods, initial encounter: Secondary | ICD-10-CM | POA: Diagnosis not present

## 2018-03-13 MED ORDER — PREDNISONE 10 MG (21) PO TBPK: ORAL_TABLET | ORAL | 0 refills | Status: DC

## 2018-03-13 NOTE — Progress Notes (Signed)
E Visit for Insect Sting  Thank you for describing the insect sting for [place].  Here is how we plan to help!  A sting that we will treat with a short course of prednisone.  The 2 greatest risks from insect stings are allergic reaction, which can be fatal in some people and infection, which is more common and less serious.  Bees, wasps, yellow jackets, and hornets belong to a class of insects called Hymenoptera.  Most insect stings cause only minor discomfort.  Stings can happen anywhere on the body and can be painful.  Most stings are from honey bees or yellow jackets.  Fire ants can sting multiple times.  The sites of the stings are more likely to become infected.    I have sent in prednisone 10 mg tapering dose for 5 days to the pharmacy you selected.  Please make sure that you selected a pharmacy that is open now.  What can be used to prevent Insect Stings?   Insect repellant with at least 20% DEET.    Wearing long pants and shirts with socks and shoes.    Wear dark or drab-colored clothes rather than bright colors.    Avoid using perfumes and hair sprays; these attract insects.  HOME CARE ADVICE:  1. Stinger removal:  The stinger looks like a tiny black dot in the sting.  Use a fingernail, credit card edge, or knife-edge to scrape it off.  Don't pull it out because it squeezes out more venom.  If the stinger is below the skin surface, leave it alone.  It will be shed with normal skin healing. 2. Use cold compresses to the area of the sting for 10-20 minutes.  You may repeat this as needed to relieve symptoms of pain and swelling. 3.  For pain relief, take acetominophen 650 mg 4-6 hours as needed or ibuprofen 400 mg every 6-8 hours as needed or naproxen 250-500 mg every 12 hours as needed. 4.  You can also use hydrocortisone cream 0.5% or 1% up to 4 times daily as needed for itching. 5.  If the sting becomes very itchy, take Benadryl 25-50 mg, follow directions on box. 6.   Wash the area 2-3 times daily with antibacterial soap and warm water. 7. Call your Doctor if:  Fever, a severe headache, or rash occur in the next 2 weeks.  Sting area begins to look infected.  Redness and swelling worsens after home treatment.  Your current symptoms become worse.    MAKE SURE YOU:   Understand these instructions.  Will watch your condition.  Will get help right away if you are not doing well or get worse.  Thank you for choosing an e-visit. Your e-visit answers were reviewed by a board certified advanced clinical practitioner to complete your personal care plan. Depending upon the condition, your plan could have included both over the counter or prescription medications. Please review your pharmacy choice. Be sure that the pharmacy you have chosen is open so that you can pick up your prescription now.  If there is a problem you may message your provider in [place] to have the prescription routed to another pharmacy. Your safety is important to [place]. If you have drug allergies check your prescription carefully.  For the next 24 hours, you can use MyChart to ask questions about today's visit, request a non-urgent call back, or ask for a work or school excuse from your e-visit provider. You will get an email in the next  two days asking about your experience. I hope that your e-visit has been valuable and will speed your recovery.

## 2018-03-29 ENCOUNTER — Telehealth: Payer: 59 | Admitting: Family

## 2018-03-29 DIAGNOSIS — H00014 Hordeolum externum left upper eyelid: Secondary | ICD-10-CM | POA: Diagnosis not present

## 2018-03-29 NOTE — Progress Notes (Signed)
Thank you for the details you included in the comment boxes. Those details are very helpful in determining the best course of treatment for you and help [place] to provide the best care.If your condition markedly worsens in the next 24 hours or has discharge, please reply to this message and inform us.   We are sorry that you are not feeling well. Here is how we plan to help!  Based on what you have shared with me it looks like you have a stye.  A stye is an inflammation of the eyelid.  It is often a red, painful lump near the edge of the eyelid that may look like a boil or a pimple.  A stye develops when an infection occurs at the base of an eyelash.   We have made appropriate suggestions for you based upon your presentation: Simple styes can be treated without medical intervention.  Most styes either resolve spontaneously or resolve with simple home treatment by applying warm compresses or heated washcloth to the stye for about 10-15 minutes three to four times a day. This causes the stye to drain and resolve.  HOME CARE:   Wash your hands often!  Let the stye open on its own. Don't squeeze or open it.  Don't rub your eyes. This can irritate your eyes and let in bacteria.  If you need to touch your eyes, wash your hands first.  Don't wear eye makeup or contact lenses until the area has healed.  GET HELP RIGHT AWAY IF:   Your symptoms do not improve.  You develop blurred or loss of vision.  Your symptoms worsen (increased discharge, pain or redness).  Thank you for choosing an e-visit.  Your e-visit answers were reviewed by a board certified advanced clinical practitioner to complete your personal care plan.  Depending upon the condition, your plan could have included both over the counter or prescription medications.  Please review your pharmacy choice.  Make sure the pharmacy is open so you can pick up prescription now.  If there is a problem, you may contact your provider through  [employer] and have the prescription routed to another pharmacy.    Your safety is important to [place].  If you have drug allergies check your prescription carefully.  For the next 24 hours you can use MyChart to ask questions about today's visit, request a non-urgent call back, or ask for a work or school excuse.  You will get an email in the next two days asking about your experience.  I hope you that your e-visit has been valuable and will speed your recovery.

## 2018-04-07 ENCOUNTER — Encounter: Payer: Self-pay | Admitting: Family Medicine

## 2018-04-08 NOTE — Telephone Encounter (Signed)
Dr. Fry please advise. Thanks  

## 2018-04-09 NOTE — Telephone Encounter (Signed)
We can most likely prescribe this but she needs an OV to talk about it. She can make a morning appt and come in fasting so she can do the lab work also

## 2018-04-12 ENCOUNTER — Encounter: Payer: Self-pay | Admitting: Family Medicine

## 2018-04-12 ENCOUNTER — Ambulatory Visit: Payer: 59 | Admitting: Family Medicine

## 2018-04-12 VITALS — BP 108/62 | HR 64 | Temp 97.8°F | Wt 217.0 lb

## 2018-04-12 DIAGNOSIS — E039 Hypothyroidism, unspecified: Secondary | ICD-10-CM | POA: Diagnosis not present

## 2018-04-12 DIAGNOSIS — E785 Hyperlipidemia, unspecified: Secondary | ICD-10-CM | POA: Insufficient documentation

## 2018-04-12 DIAGNOSIS — F900 Attention-deficit hyperactivity disorder, predominantly inattentive type: Secondary | ICD-10-CM | POA: Diagnosis not present

## 2018-04-12 LAB — HEPATIC FUNCTION PANEL
ALT: 17 U/L (ref 0–35)
AST: 17 U/L (ref 0–37)
Albumin: 4 g/dL (ref 3.5–5.2)
Alkaline Phosphatase: 57 U/L (ref 39–117)
BILIRUBIN TOTAL: 0.5 mg/dL (ref 0.2–1.2)
Bilirubin, Direct: 0.1 mg/dL (ref 0.0–0.3)
TOTAL PROTEIN: 6.5 g/dL (ref 6.0–8.3)

## 2018-04-12 LAB — T3, FREE: T3 FREE: 2.7 pg/mL (ref 2.3–4.2)

## 2018-04-12 LAB — LIPID PANEL
Cholesterol: 203 mg/dL — ABNORMAL HIGH (ref 0–200)
HDL: 62.4 mg/dL (ref >39.00)
LDL Cholesterol: 112 mg/dL — ABNORMAL HIGH (ref 0–99)
NonHDL: 140.98
Total CHOL/HDL Ratio: 3
Triglycerides: 143 mg/dL (ref 0.0–149.0)
VLDL: 28.6 mg/dL (ref 0.0–40.0)

## 2018-04-12 LAB — T4, FREE: FREE T4: 0.75 ng/dL (ref 0.60–1.60)

## 2018-04-12 LAB — TSH: TSH: 2.28 u[IU]/mL (ref 0.35–4.50)

## 2018-04-12 MED ORDER — AMPHETAMINE-DEXTROAMPHET ER 15 MG PO CP24: 15.0000 mg | ORAL_CAPSULE | ORAL | 0 refills | Status: DC

## 2018-04-12 MED FILL — ADDERALL XR 15 MG CAP SA: 15 | 30 days supply | Qty: 30 | Fill #0

## 2018-04-12 NOTE — Progress Notes (Signed)
   Subjective:    Patient ID: Jill Baker, female    DOB: December 06, 1980, 37 y.o.   MRN: [id number]  HPI Here for fasting labs to follow up lipids (she started on Lipitor 3 months ago) and for a thyroid panel (we decreased the dose of her Levothyroxine). She feels great. She does ask for help with focus however. 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, she finds that the intensity of the work required can be overwhelming. She goes to lectures and studies long hours in addition to being a mother and having a family. She loses her focus and has trouble completing tasks.    Review of Systems  Constitutional: Negative.   Respiratory: Negative.   Cardiovascular: Negative.   Endocrine: Negative.   Neurological: Negative.   Psychiatric/Behavioral: Positive for decreased concentration. Negative for dysphoric mood. The patient is not nervous/anxious.        Objective:   Physical Exam  Constitutional: She is oriented to person, place, and time. She appears well-developed and well-nourished.  Neck: No thyromegaly present.  Cardiovascular: Normal rate, regular rhythm, normal heart sounds and intact distal pulses.  Pulmonary/Chest: Effort normal and breath sounds normal.  Lymphadenopathy:    She has no cervical adenopathy.  Neurological: She is alert and oriented to person, place, and time.          Assessment & Plan:  For the lipids, we will get a lipid panel. For the hypothyroidism, get a thyroid panel. For the ADHD she will try Adderall XR 15 mg in the morning as needed.  Alysia Penna, MD

## 2018-04-15 ENCOUNTER — Encounter: Payer: Self-pay | Admitting: *Deleted

## 2018-05-13 MED FILL — SERTRALINE HCL 25 MG TABLET: 25 | 90 days supply | Qty: 90 | Fill #0

## 2018-05-16 MED FILL — LEVOTHYROXINE 100 MCG TABLE: 100 | 90 days supply | Qty: 90 | Fill #2

## 2018-05-21 ENCOUNTER — Encounter: Payer: Self-pay | Admitting: Family Medicine

## 2018-05-21 NOTE — Telephone Encounter (Signed)
Dr. Sarajane Jews please advise on the refill.

## 2018-05-23 MED ORDER — COLCHICINE 0.6 MG PO TABS: 0.3000 mg | ORAL_TABLET | Freq: Every evening | ORAL | 3 refills | Status: AC

## 2018-05-23 MED ORDER — AMPHETAMINE-DEXTROAMPHET ER 20 MG PO CP24: 20.0000 mg | ORAL_CAPSULE | ORAL | 0 refills | Status: DC

## 2018-05-23 MED FILL — COLCHICINE 0.6 MG TABS: 0.6 | 90 days supply | Qty: 45 | Fill #0

## 2018-05-23 MED FILL — ADDERALL XR 20 MG CAP SA: 20 | 90 days supply | Qty: 90 | Fill #0

## 2018-05-23 NOTE — Telephone Encounter (Signed)
I increased the Adderall XR to 20 mg daily and I sent both of these in

## 2018-06-06 MED FILL — METOPROLOL SUCCINATE ER 100: 100 | 90 days supply | Qty: 90 | Fill #2

## 2018-06-06 MED FILL — OMEPRAZOLE 40 MG CPDR: 40 | 90 days supply | Qty: 90 | Fill #2

## 2018-06-06 MED FILL — ATORVASTATIN CALCIUM 20 MG: 20 | 90 days supply | Qty: 90 | Fill #2

## 2018-07-22 ENCOUNTER — Ambulatory Visit: Payer: Self-pay | Admitting: Nurse Practitioner

## 2018-07-22 VITALS — BP 105/70 | HR 75 | Temp 97.7°F | Resp 16 | Wt 225.0 lb

## 2018-07-22 DIAGNOSIS — W5501XA Bitten by cat, initial encounter: Secondary | ICD-10-CM

## 2018-07-22 MED ORDER — AMOXICILLIN-POT CLAVULANATE 875-125 MG PO TABS: 1.0000 | ORAL_TABLET | Freq: Two times a day (BID) | ORAL | 0 refills | Status: AC

## 2018-07-22 MED ORDER — MUPIROCIN 2 % EX OINT: 1.0000 "application " | TOPICAL_OINTMENT | Freq: Two times a day (BID) | CUTANEOUS | 0 refills | Status: AC

## 2018-07-22 NOTE — Patient Instructions (Addendum)
Animal Bite, Adult -Take medication as directed. -Clean the area twice daily with Hibiclens soap.  You can purchase this soap over-the-counter at any local pharmacy. -Keep the area covered when at work.  When you are at home leave the areas open to air to promote healing. -Follow-up in the emergency department if you develop fever, chills, malaise, redness or streaking around or up your leg, swelling, foul-smelling drainage, or other concerns. -Follow-up as needed.  Animal bites range from mild to serious. An animal bite can result in any of these injuries:  A scratch.  A deep, open cut.  A puncture of the skin.  A crush injury.  Tearing away of the skin or a body part.  A bone injury. A small bite from a house pet is usually less serious than a bite from a stray or wild animal, such as a raccoon, fox, skunk, or bat. That is because stray and wild animals have a higher risk of carrying a serious infection called rabies, which can be passed to humans through a bite. What increases the risk? You are more likely to be bitten by an animal if:  You are around unfamiliar pets.  You disturb an animal when it is eating, sleeping, or caring for its babies.  You are outdoors in a place where small, wild animals roam freely. What are the signs or symptoms? Common symptoms of an animal bite include:  Pain.  Bleeding.  Swelling.  Bruising. How is this diagnosed? This condition may be diagnosed based on a physical exam and medical history. Your health care provider will examine your wound and ask for details about the animal and how the bite happened. You may also have tests, such as:  Blood tests to check for infection.  X-rays to check for damage to bones or joints.  Taking a fluid sample from your wound and checking it for infection (culture test). How is this treated? Treatment varies depending on the type of animal, where the bite is on your body, and your medical history.  Treatment may include:  Caring for the wound. This often includes cleaning the wound, rinsing out (flushing) the wound with saline solution, and applying a bandage (dressing). In some cases, the wound may be closed with stitches (sutures), staples, skin glue, or adhesive strips.  Antibiotic medicine to prevent or treat infection. This medicine may be prescribed in pill or ointment form. If the bite area becomes infected, the medicine may be given through an IV.  A tetanus shot to prevent tetanus infection.  Rabies treatment to prevent rabies infection. This will be done if the animal could have rabies.  Surgery. This may be done if a bite gets infected or if there is damage that needs to be repaired. Follow these instructions at home: Wound care   Follow instructions from your health care provider about how to take care of your wound. Make sure you: ? Wash your hands with soap and water before you change your dressing. If soap and water are not available, use hand sanitizer. ? Change your dressing as told by your health care provider. ? Leave sutures, skin glue, or adhesive strips in place. These skin closures may need to stay in place for 2 weeks or longer. If adhesive strip edges start to loosen and curl up, you may trim the loose edges. Do not remove adhesive strips completely unless your health care provider tells you to do that.  Check your wound every day for signs of infection.  Check for: ? More redness, swelling, or pain. ? More fluid or blood. ? Warmth. ? Pus or a bad smell. Medicines  Take or apply over-the-counter and prescription medicines only as told by your health care provider.  If you were prescribed an antibiotic, take or apply it as told by your health care provider. Do not stop using the antibiotic even if your condition improves. General instructions   Keep the injured area raised (elevated) above the level of your heart while you are sitting or lying down, if  this is possible.  If directed, put ice on the injured area. ? Put ice in a plastic bag. ? Place a towel between your skin and the bag. ? Leave the ice on for 20 minutes, 2-3 times per day.  Keep all follow-up visits as told by your health care provider. This is important. Contact a health care provider if:  You have more redness, swelling, or pain around your wound.  Your wound feels warm to the touch.  You have a fever or chills.  You have a general feeling of sickness (malaise).  You feel nauseous or you vomit.  You have pain that does not get better. Get help right away if:  You have a red streak that leads away from your wound.  You have non-clear fluid or more blood coming from your wound.  There is pus or a bad smell coming from your wound.  You have trouble moving your injured area.  You have numbness or tingling that extends beyond the wound. Summary  Animal bites can range from mild to serious. An animal bite can cause a scratch on the skin, a deep open cut, a puncture of the skin, a crush injury, tearing away of the skin or a body part, or a bone injury.  Your health care provider will examine your wound and ask for details about the animal and how the bite happened.  You may also have tests such as a blood test, X-ray, or testing of a fluid sample from your wound (culture test).  Treatment may include wound care, antibiotic medicine, a tetanus shot, and rabies treatment if the animal could have rabies. This information is not intended to replace advice given to you by your health care provider. Make sure you discuss any questions you have with your health care provider. Document Released: 03/07/2011 Document Revised: 12/28/2016 Document Reviewed: 12/28/2016 Elsevier Interactive Patient Education  2019 [employer].

## 2018-07-22 NOTE — Progress Notes (Signed)
Subjective:    Patient ID: Jill Baker, female    DOB: [birth year], 38 y.o.   MRN: [id number]  The patient is a 38 year old female who presents with complaints of a cat bite to her right lower extremity.  Patient states the incident happened around 5 PM this evening.  Patient states she was walking her dogs along with her children and a cat whom she is familiar with approached her in the neighborhood.  Patient states the cat does not have a rightful owner, but is known in that area.  The patient states the cat bit her and also called her on the lower aspect of her right lower extremity.  Patient denies fever, chills, malaise, abdominal pain, drainage, streaking, or pain at this time.  Patient states her last tetanus shot was in 2019.  Patient was also able to obtain the cat shot records which show his rabies vaccines are up-to-date.  The patient also filled out an animal control form prior to coming to our office.  This can be verified by calling the office or at [phone].  Past Medical History:  Diagnosis Date  . Acid reflux   . Cervical adenopathy    Pt denies blood dyscrasia  . Colon polyps    hyperplastic  . Diverticulosis   . Esophageal stricture   . Fatty liver   . GERD (gastroesophageal reflux disease)   . Hand foot syndrome   . Hiatal hernia   . Hypothyroid   . PONV (postoperative nausea and vomiting)   . Tachycardia    controlled on metoprolol  . Tongue ulcer     Review of Systems  Constitutional: Negative.   Respiratory: Negative.   Cardiovascular: Negative.   Gastrointestinal: Negative.   Skin: Positive for wound (cat bite to right lower extremity).  Psychiatric/Behavioral: Negative.        Objective:  Blood pressure 105/70, pulse 75, temperature 97.7 F (36.5 C), temperature source Oral, resp. rate 16, weight 225 lb (102.1 kg), SpO2 98 %.    Physical Exam Vitals signs reviewed.  Constitutional:      General: She is not in acute distress.  Appearance: Normal appearance.  HENT:     Head: Normocephalic.     Mouth/Throat:     Mouth: Mucous membranes are moist.  Cardiovascular:     Rate and Rhythm: Normal rate and regular rhythm.     Pulses: Normal pulses.     Heart sounds: Normal heart sounds.  Pulmonary:     Effort: Pulmonary effort is normal.     Breath sounds: Normal breath sounds.  Skin:    General: Skin is warm and dry.     Capillary Refill: Capillary refill takes less than 2 seconds.     Findings: Erythema, signs of injury and wound present.          Comments: Multiple areas of linear superficial abrasions on the anterior aspect of the patient's right ankle. Abrasions Small puncture wound note above this area.  3 areas of small abrasions to the posterior aspect of the ankle. All areas have dried blood. No drainage, streaking observed. See media.   Neurological:     Mental Status: She is alert.  Psychiatric:        Mood and Affect: Mood normal.        Thought Content: Thought content normal.        Assessment & Plan:   Exam findings, diagnosis etiology and medication use and indications reviewed with patient. Follow- Up  and discharge instructions provided. No emergent/urgent issues found on exam.  This on the patient's clinical presentation, symptoms, and physical assessment, I am going to go ahead and treat the patient prophylactically with Augmentin.  We will also prescribe mupirocin topical antibiotic ointment.  Patient was instructed to clean the area twice daily with a Hibiclens soap before applying the mupirocin ointment.  Patient will keep the area covered when at work, but leave it open to air when she is at home.  Discussed at length with patient signs and symptoms of infection to include fever, malaise, chills, redness or streaking down or up the leg, foul-smelling drainage, or swelling.  Does not demonstrate any signs of infection at this time as patient is well-appearing and vital signs are stable. Patient  education was provided. Patient verbalized understanding of information provided and agrees with plan of care (POC), all questions answered. The patient is advised to call or return to clinic if condition does not see an improvement in symptoms, or to seek the care of the closest emergency department if condition worsens with the above plan.   1. Cat bite, initial encounter  - amoxicillin-clavulanate (AUGMENTIN) 875-125 MG tablet; Take 1 tablet by mouth 2 (two) times daily for 7 days.  Dispense: 14 tablet; Refill: 0 - mupirocin ointment (BACTROBAN) 2 %; Place 1 application into the nose 2 (two) times daily for 10 days. Apply to the affected area twice daily for 14 days or until symptoms improve.  Dispense: 30 g; Refill: 0 -Take medication as directed. -Clean the area twice daily with Hibiclens soap.  You can purchase this soap over-the-counter at any local pharmacy. -Keep the area covered when at work.  When you are at home leave the areas open to air to promote healing. -Follow-up in the emergency department if you develop fever, chills, malaise, redness or streaking around or up your leg, swelling, foul-smelling drainage, or other concerns. -Follow-up as needed.

## 2018-08-15 MED FILL — LEVOTHYROXINE 100 MCG TABLE: 100 | 90 days supply | Qty: 90 | Fill #3

## 2018-08-15 MED FILL — COLCHICINE 0.6 MG TABS: 0.6 | 90 days supply | Qty: 45 | Fill #1

## 2018-08-15 MED FILL — SERTRALINE HCL 25 MG TABLET: 25 | 90 days supply | Qty: 90 | Fill #1

## 2018-08-15 MED FILL — ATORVASTATIN 20 MG TABLET: 20 | 90 days supply | Qty: 90 | Fill #3

## 2018-09-04 ENCOUNTER — Encounter: Payer: Self-pay | Admitting: Family Medicine

## 2018-09-09 MED FILL — METOPROLOL SUCCINATE ER 100: 100 | 30 days supply | Qty: 30 | Fill #3 | Status: TO

## 2018-09-09 MED FILL — OMEPRAZOLE 40 MG CPDR: 40 | 30 days supply | Qty: 30 | Fill #3

## 2018-09-11 ENCOUNTER — Telehealth: Payer: Self-pay | Admitting: Family Medicine

## 2018-09-11 NOTE — Telephone Encounter (Signed)
Called and spoke with Annie Main with the pharmacy.  He stated that the pt came in feb and paid cash for her meds.  The colchicine needs to have the PA.  They will fax over the PA for this.

## 2018-09-11 NOTE — Telephone Encounter (Addendum)
Copied from [place] [id number]. Topic: General - Other >> Sep 11, 2018  2:15 PM Lennox Solders wrote: Reason for CRM: Sheilah Pigeon with [facility] is calling and checking on the status of PA for colchine. Pt was under [employer]

## 2018-09-12 ENCOUNTER — Telehealth: Payer: Self-pay

## 2018-09-12 NOTE — Telephone Encounter (Signed)
PA for colchicine 0.6 MG tablet has been sent to cover my meds.

## 2018-09-12 NOTE — Telephone Encounter (Signed)
PA sent today. See phone note from 09/12/2018

## 2018-09-12 NOTE — Telephone Encounter (Signed)
I spoke with the pharmacy yesterday and they are sending the PA form for the medication needed.

## 2018-09-17 NOTE — Telephone Encounter (Signed)
Form from [employer] completed and faxed to [phone].  Fax and message forwarded to [place] for follow up.

## 2018-10-14 ENCOUNTER — Other Ambulatory Visit: Payer: Self-pay | Admitting: Family Medicine

## 2018-10-14 MED FILL — METOPROLOL SUCCINATE ER 100: 100 | 30 days supply | Qty: 30 | Fill #0

## 2018-10-22 ENCOUNTER — Encounter: Payer: Self-pay | Admitting: Family Medicine

## 2018-10-23 MED ORDER — AMPHETAMINE-DEXTROAMPHET ER 20 MG PO CP24: 20.0000 mg | ORAL_CAPSULE | ORAL | 0 refills | Status: DC

## 2018-10-23 NOTE — Telephone Encounter (Signed)
Please advise on refills. thanks

## 2018-10-23 NOTE — Telephone Encounter (Signed)
Done

## 2018-10-30 MED FILL — ADDERALL XR 20 MG CAP SA: 20 | 90 days supply | Qty: 90 | Fill #0

## 2018-11-05 NOTE — Telephone Encounter (Signed)
Checked the status of PA. Cover my meds states that a determination will be faxed to to office.

## 2018-11-07 NOTE — Telephone Encounter (Signed)
Key: [id number]

## 2018-11-07 NOTE — Telephone Encounter (Signed)
Spoke with the patients pharmacy. This medication is not covered because the patients [facility] insurance is no longer active. Patient has paid cash for this. Nothing further needed.

## 2018-11-08 MED FILL — METOPROLOL SUCCINATE ER 100: 100 | 30 days supply | Qty: 30 | Fill #1

## 2018-11-11 ENCOUNTER — Other Ambulatory Visit: Payer: Self-pay | Admitting: Family Medicine

## 2018-11-11 MED FILL — SERTRALINE HCL 25 MG TABLET: 25 | 90 days supply | Qty: 90 | Fill #0

## 2018-11-13 MED FILL — LEVOTHYROXINE 100 MCG TAB: 100 | 90 days supply | Qty: 90 | Fill #0

## 2018-12-03 ENCOUNTER — Other Ambulatory Visit: Payer: Self-pay | Admitting: Family Medicine

## 2018-12-05 MED FILL — OMEPRAZOLE 40 MG CPDR: 40 | 90 days supply | Qty: 90 | Fill #0

## 2018-12-05 MED FILL — ATORVASTATIN 20 MG TABLET: 20 | 90 days supply | Qty: 90 | Fill #0

## 2018-12-09 ENCOUNTER — Other Ambulatory Visit: Payer: Self-pay | Admitting: Family Medicine

## 2018-12-11 MED FILL — METOPROLOL SUCCINATE ER 100: 100 | 90 days supply | Qty: 90 | Fill #0

## 2018-12-27 NOTE — Telephone Encounter (Signed)
Checked on status of PA. PA is still being processed.

## 2019-01-24 NOTE — Telephone Encounter (Signed)
Checked on status of PA. Insurance company will fax a response.

## 2019-02-03 MED FILL — SERTRALINE HCL 25 MG TABLET: 25 | 90 days supply | Qty: 90 | Fill #1

## 2019-02-05 MED FILL — LEVOTHYROXINE 100 MCG TAB: 100 | 90 days supply | Qty: 90 | Fill #1

## 2019-03-06 MED FILL — METOPROLOL SUCCINATE ER 100: 100 | 90 days supply | Qty: 90 | Fill #1

## 2019-03-06 MED FILL — ATORVASTATIN 20 MG TABLET: 20 | 90 days supply | Qty: 90 | Fill #1

## 2019-03-06 MED FILL — OMEPRAZOLE DR 40 MG CAPSULE: 40 | 90 days supply | Qty: 90 | Fill #1

## 2019-04-25 ENCOUNTER — Encounter (HOSPITAL_COMMUNITY): Payer: Self-pay

## 2019-05-05 ENCOUNTER — Other Ambulatory Visit: Payer: Self-pay | Admitting: Family Medicine

## 2019-05-05 NOTE — Telephone Encounter (Signed)
Patient need to schedule an ov for more refills. 

## 2019-05-06 MED FILL — LEVOTHYROXINE 100 MCG TAB: 100 | 90 days supply | Qty: 90 | Fill #2

## 2019-05-07 NOTE — Telephone Encounter (Signed)
Left message to return phone call. Pt needs an ov for further refills

## 2019-05-08 ENCOUNTER — Encounter: Payer: Self-pay | Admitting: Family Medicine

## 2019-05-15 ENCOUNTER — Encounter: Payer: Self-pay | Admitting: Family Medicine

## 2019-05-15 ENCOUNTER — Other Ambulatory Visit: Payer: Self-pay

## 2019-05-15 ENCOUNTER — Telehealth (INDEPENDENT_AMBULATORY_CARE_PROVIDER_SITE_OTHER): Payer: Medicaid Other | Admitting: Family Medicine

## 2019-05-15 DIAGNOSIS — R000 Tachycardia, unspecified: Secondary | ICD-10-CM

## 2019-05-15 DIAGNOSIS — F419 Anxiety disorder, unspecified: Secondary | ICD-10-CM

## 2019-05-15 MED ORDER — METOPROLOL SUCCINATE ER 100 MG PO TB24: ORAL_TABLET | ORAL | 3 refills | Status: AC

## 2019-05-15 MED ORDER — SERTRALINE HCL 25 MG PO TABS: 25.0000 mg | ORAL_TABLET | Freq: Every day | ORAL | 3 refills | Status: DC

## 2019-05-15 MED FILL — SERTRALINE HCL 25 MG TABLET: 25 | 90 days supply | Qty: 90 | Fill #0

## 2019-05-15 NOTE — Progress Notes (Signed)
Virtual Visit via Video Note  I connected with the patient on 05/15/19 at 10:45 AM EST by a video enabled telemedicine application and verified that I am speaking with the correct person using two identifiers.  Location patient: home Location provider:work or home office Persons participating in the virtual visit: patient, provider  I discussed the limitations of evaluation and management by telemedicine and the availability of in person appointments. The patient expressed understanding and agreed to proceed.   HPI: Here to follow up on anxiety and tachycardia. She feels great and has no concerns. 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    ROS: See pertinent positives and negatives per HPI.  Past Medical History:  Diagnosis Date  . Acid reflux   . Cervical adenopathy    Pt denies blood dyscrasia  . Colon polyps    hyperplastic  . Diverticulosis   . Esophageal stricture   . Fatty liver   . GERD (gastroesophageal reflux disease)   . Hand foot syndrome   . Hiatal hernia   . Hypothyroid   . PONV (postoperative nausea and vomiting)   . Tachycardia    controlled on metoprolol  . Tongue ulcer     Past Surgical History:  Procedure Laterality Date  . Cervical lymph node Biopsy  02/24/2011   reactive only, per Dr. Georganna Skeans  . CESAREAN SECTION N/A 08/16/2012   Procedure: CESAREAN SECTION;  Surgeon: Elveria Royals, MD;  Location: [facility];  Service: Obstetrics;  Laterality: N/A;  . CHOLECYSTECTOMY    . COLONOSCOPY W/ POLYPECTOMY  06/08/2009  . FOOT SURGERY    . LAPAROSCOPIC GASTRIC SLEEVE RESECTION WITH HIATAL HERNIA REPAIR N/A 01/22/2017   Procedure: LAPAROSCOPIC GASTRIC SLEEVE RESECTION WITH HIATAL HERNIA REPAIR;  Surgeon: Greer Pickerel, MD;  Location: [facility];  Service: General;  Laterality: N/A;  . SHOULDER ARTHROSCOPY Left 12/26/2017   Procedure: ARTHROSCOPY LEFT SHOULDER ROTATOR CUFF REPAIR AND BICEPS TENODESIS, ACROMIIOPLASTY AND DISTAL CLAVICLE RESECTION;   Surgeon: Dorna Leitz, MD;  Location: [facility];  Service: Orthopedics;  Laterality: Left;  . TONGUE BIOPSY    . TONSILLECTOMY    . UPPER GI ENDOSCOPY  01/22/2017   Procedure: UPPER GI ENDOSCOPY;  Surgeon: Greer Pickerel, MD;  Location: [facility];  Service: General;;  . UPPER GI ENDOSCOPY  06/08/2009    Family History  Problem Relation Age of Onset  . Heart disease Mother   . Hypertension Mother   . Breast cancer Maternal Grandmother      Current Outpatient Medications:  .  amphetamine-dextroamphetamine (ADDERALL XR) 20 MG 24 hr capsule, Take 1 capsule (20 mg total) by mouth every morning., Disp: 90 capsule, Rfl: 0 .  atorvastatin (LIPITOR) 20 MG tablet, TAKE 1 TABLET (20 MG TOTAL) BY MOUTH DAILY., Disp: 90 tablet, Rfl: 3 .  cetirizine (ZYRTEC) 10 MG tablet, Take 10 mg by mouth daily., Disp: , Rfl:  .  colchicine 0.6 MG tablet, Take 0.5 tablets (0.3 mg total) by mouth every evening., Disp: 45 tablet, Rfl: 3 .  levothyroxine (SYNTHROID) 100 MCG tablet, TAKE 1 TABLET (100 MCG TOTAL) BY MOUTH DAILY., Disp: 90 tablet, Rfl: 3 .  metoprolol succinate (TOPROL-XL) 100 MG 24 hr tablet, TAKE 1 TABLET BY MOUTH DAILY. TAKE WITH OR IMMEDIATELY FOLLOWING A MEAL., Disp: 90 tablet, Rfl: 3 .  omeprazole (PRILOSEC) 40 MG capsule, TAKE 1 CAPSULE BY MOUTH DAILY., Disp: 90 capsule, Rfl: 3 .  sertraline (ZOLOFT) 25 MG tablet, Take 1 tablet (25 mg  total) by mouth at bedtime., Disp: 90 tablet, Rfl: 3  EXAM:  VITALS per patient if applicable:  GENERAL: alert, oriented, appears well and in no acute distress  HEENT: atraumatic, conjunttiva clear, no obvious abnormalities on inspection of external nose and ears  NECK: normal movements of the head and neck  LUNGS: on inspection no signs of respiratory distress, breathing rate appears normal, no obvious gross SOB, gasping or wheezing  CV: no obvious cyanosis  MS: moves all visible extremities without noticeable abnormality  PSYCH/NEURO:  pleasant and cooperative, no obvious depression or anxiety, speech and thought processing grossly intact  ASSESSMENT AND PLAN: Her anxiety and tachycardia are well controlled. Stay on Sertraline and Metoprolol.  Alysia Penna, MD  Discussed the following assessment and plan:  TACHYCARDIA  Anxiety disorder, unspecified type     I discussed the assessment and treatment plan with the patient. The patient was provided an opportunity to ask questions and all were answered. The patient agreed with the plan and demonstrated an understanding of the instructions.   The patient was advised to call back or seek an in-person evaluation if the symptoms worsen or if the condition fails to improve as anticipated.

## 2019-06-09 MED FILL — OMEPRAZOLE DR 40 MG CAPSULE: 40 | 90 days supply | Qty: 90 | Fill #2

## 2019-06-09 MED FILL — ATORVASTATIN 20 MG TABLET: 20 | 90 days supply | Qty: 90 | Fill #2

## 2019-06-09 MED FILL — METOPROLOL SUCCINATE ER 100: 100 | 90 days supply | Qty: 90 | Fill #0

## 2019-08-05 MED FILL — LEVOTHYROXINE SODIUM 100 MC: 100 | 90 days supply | Qty: 90 | Fill #3

## 2019-08-09 ENCOUNTER — Telehealth: Payer: Self-pay | Admitting: Family Medicine

## 2019-08-09 MED FILL — SERTRALINE HCL 25 MG TABLET: 25 | 90 days supply | Qty: 90 | Fill #1

## 2019-08-11 ENCOUNTER — Encounter (HOSPITAL_COMMUNITY): Payer: Self-pay

## 2019-08-11 NOTE — Telephone Encounter (Signed)
Last filled 10/23/2018 Last OV 05/15/2019  Ok to fill?

## 2019-08-15 MED ORDER — AMPHETAMINE-DEXTROAMPHET ER 20 MG PO CP24: ORAL_CAPSULE | ORAL | 0 refills | Status: AC

## 2019-08-15 MED FILL — ADDERALL XR 20 MG CAP SA: 20 | 30 days supply | Qty: 30 | Fill #0

## 2019-08-15 NOTE — Telephone Encounter (Signed)
Patient is aware Rx was sent in.

## 2019-08-15 NOTE — Telephone Encounter (Signed)
Rx canceled

## 2019-08-15 NOTE — Telephone Encounter (Signed)
I sent a refill to [facility] by mistake. Please call them to cancel this. I also sent one to [facility] (which is the correct one)

## 2019-09-08 MED FILL — ATORVASTATIN 20 MG TABLET: 20 | 90 days supply | Qty: 90 | Fill #3

## 2019-09-08 MED FILL — METOPROLOL SUCCINATE ER 100: 100 | 90 days supply | Qty: 90 | Fill #1

## 2019-09-08 MED FILL — OMEPRAZOLE 40 MG CPDR: 40 | 90 days supply | Qty: 90 | Fill #3

## 2019-09-25 MED FILL — ADDERALL XR 20 MG CAP SA: 20 | 30 days supply | Qty: 30 | Fill #0

## 2019-11-03 ENCOUNTER — Other Ambulatory Visit: Payer: Self-pay | Admitting: Family Medicine

## 2019-11-05 MED FILL — SERTRALINE HCL 25 MG TABLET: 25 | 90 days supply | Qty: 90 | Fill #0

## 2019-11-05 MED FILL — LEVOTHYROXINE 100 MCG TABLE: 100 | 90 days supply | Qty: 90 | Fill #0

## 2019-12-15 MED FILL — METOPROLOL SUCCINATE ER 100: 100 | 90 days supply | Qty: 90 | Fill #2

## 2020-01-16 MED FILL — ADDERALL XR 20 MG CAP SA: 20 | 30 days supply | Qty: 30 | Fill #0

## 2020-02-09 MED FILL — SERTRALINE HCL 25 MG TABLET: 25 | 90 days supply | Qty: 90 | Fill #2

## 2020-02-09 MED FILL — LEVOTHYROXINE 100 MCG TABLE: 100 | 90 days supply | Qty: 90 | Fill #0

## 2020-03-09 MED FILL — OMEPRAZOLE 40 MG CPDR: 40 | 90 days supply | Qty: 90 | Fill #0

## 2020-03-09 MED FILL — ATORVASTATIN 20 MG TABLET: 20 | 90 days supply | Qty: 90 | Fill #1

## 2020-03-09 MED FILL — METOPROLOL SUCCINATE ER 100: 100 | 90 days supply | Qty: 90 | Fill #3

## 2020-03-12 MED FILL — ADDERALL XR 20 MG CAP SA: 20 | 30 days supply | Qty: 30 | Fill #0

## 2020-05-17 ENCOUNTER — Other Ambulatory Visit: Payer: Self-pay | Admitting: Family Medicine

## 2020-05-17 MED FILL — SERTRALINE HCL 25 MG TABLET: 25 | 90 days supply | Qty: 90 | Fill #0

## 2020-05-17 MED FILL — LEVOTHYROXINE 100 MCG TABLE: 100 | 90 days supply | Qty: 90 | Fill #0

## 2020-05-17 MED FILL — ADDERALL XR 20 MG CAP SA: 20 | 30 days supply | Qty: 30 | Fill #0

## 2020-06-10 ENCOUNTER — Other Ambulatory Visit (HOSPITAL_COMMUNITY): Payer: Self-pay | Admitting: Family Medicine

## 2020-06-10 ENCOUNTER — Other Ambulatory Visit: Payer: Self-pay | Admitting: Family Medicine

## 2020-06-10 MED FILL — OMEPRAZOLE 40 MG CPDR: 40 | 90 days supply | Qty: 90 | Fill #0

## 2020-06-10 NOTE — Telephone Encounter (Signed)
Last OV 05/15/19  Transfer of care 06/25/19

## 2020-06-11 ENCOUNTER — Other Ambulatory Visit (HOSPITAL_COMMUNITY): Payer: Self-pay | Admitting: Family Medicine

## 2020-06-11 MED FILL — METOPROLOL SUCCINATE ER 100: 100 | 30 days supply | Qty: 30 | Fill #0

## 2020-06-11 MED FILL — ATORVASTATIN CALCIUM 20 MG: 20 | 90 days supply | Qty: 90 | Fill #0

## 2020-07-12 ENCOUNTER — Other Ambulatory Visit (HOSPITAL_COMMUNITY): Payer: Self-pay | Admitting: Nurse Practitioner

## 2020-07-12 MED FILL — ADDERALL XR 20 MG CAP SA: 20 | 30 days supply | Qty: 30 | Fill #0

## 2020-07-12 MED FILL — METOPROLOL SUCCINATE ER 100: 100 | 30 days supply | Qty: 30 | Fill #1

## 2020-08-09 MED FILL — METOPROLOL SUCCINATE ER 100: 100 | 30 days supply | Qty: 30 | Fill #2

## 2020-08-09 MED FILL — LEVOTHYROXINE 100 MCG TABLE: 100 | 90 days supply | Qty: 90 | Fill #0

## 2020-08-16 ENCOUNTER — Encounter (HOSPITAL_COMMUNITY): Payer: Self-pay | Admitting: *Deleted

## 2020-08-23 MED FILL — SERTRALINE HCL 25 MG TABLET: 25 | 90 days supply | Qty: 90 | Fill #0

## 2020-09-08 ENCOUNTER — Other Ambulatory Visit (HOSPITAL_COMMUNITY): Payer: Self-pay | Admitting: Family Medicine

## 2020-09-08 MED FILL — METOPROLOL SUCCINATE ER 100: 100 | 30 days supply | Qty: 30 | Fill #3

## 2020-09-08 MED FILL — OMEPRAZOLE 40 MG CPDR: 40 | 90 days supply | Qty: 90 | Fill #0

## 2020-09-08 MED FILL — ATORVASTATIN CALCIUM 20 MG: 20 | 90 days supply | Qty: 90 | Fill #1

## 2020-10-22 ENCOUNTER — Other Ambulatory Visit (HOSPITAL_COMMUNITY): Payer: Self-pay

## 2020-10-22 MED ORDER — AMPHETAMINE-DEXTROAMPHET ER 20 MG PO CP24
20.0000 mg | ORAL_CAPSULE | Freq: Every morning | ORAL | 0 refills | Status: DC
  Filled 2020-10-22: qty 30, 30d supply, fill #0

## 2020-11-16 ENCOUNTER — Other Ambulatory Visit (HOSPITAL_COMMUNITY): Payer: Self-pay

## 2020-11-16 MED FILL — Sertraline HCl Tab 25 MG: ORAL | 90 days supply | Qty: 90 | Fill #0 | Status: AC

## 2020-11-22 ENCOUNTER — Other Ambulatory Visit (HOSPITAL_COMMUNITY): Payer: Self-pay

## 2021-03-24 ENCOUNTER — Encounter (HOSPITAL_BASED_OUTPATIENT_CLINIC_OR_DEPARTMENT_OTHER): Payer: Self-pay | Admitting: Obstetrics and Gynecology

## 2021-03-24 ENCOUNTER — Other Ambulatory Visit: Payer: Self-pay

## 2021-03-24 ENCOUNTER — Emergency Department (HOSPITAL_BASED_OUTPATIENT_CLINIC_OR_DEPARTMENT_OTHER)
Admission: EM | Admit: 2021-03-24 | Discharge: 2021-03-24 | Disposition: A | Discharge status: Home / Self Care | Payer: Medicaid Other | Attending: Emergency Medicine | Admitting: Emergency Medicine

## 2021-03-24 ENCOUNTER — Emergency Department (HOSPITAL_BASED_OUTPATIENT_CLINIC_OR_DEPARTMENT_OTHER): Payer: Medicaid Other | Admitting: Radiology

## 2021-03-24 DIAGNOSIS — W228XXA Striking against or struck by other objects, initial encounter: Secondary | ICD-10-CM | POA: Insufficient documentation

## 2021-03-24 DIAGNOSIS — S9031XA Contusion of right foot, initial encounter: Secondary | ICD-10-CM | POA: Insufficient documentation

## 2021-03-24 DIAGNOSIS — S99921A Unspecified injury of right foot, initial encounter: Secondary | ICD-10-CM | POA: Diagnosis present

## 2021-03-24 DIAGNOSIS — S92514A Nondisplaced fracture of proximal phalanx of right lesser toe(s), initial encounter for closed fracture: Secondary | ICD-10-CM | POA: Diagnosis not present

## 2021-03-24 DIAGNOSIS — E039 Hypothyroidism, unspecified: Secondary | ICD-10-CM | POA: Diagnosis not present

## 2021-03-24 DIAGNOSIS — Z79899 Other long term (current) drug therapy: Secondary | ICD-10-CM | POA: Diagnosis not present

## 2021-03-24 DIAGNOSIS — Y9289 Other specified places as the place of occurrence of the external cause: Secondary | ICD-10-CM | POA: Insufficient documentation

## 2021-03-24 MED ORDER — IBUPROFEN 800 MG PO TABS: 800.0000 mg | ORAL_TABLET | Freq: Once | ORAL | Status: DC

## 2021-03-24 MED ORDER — IBUPROFEN 600 MG PO TABS: 600.0000 mg | ORAL_TABLET | Freq: Four times a day (QID) | ORAL | 0 refills | Status: AC | PRN

## 2021-03-24 MED ORDER — HYDROCODONE-ACETAMINOPHEN 5-325 MG PO TABS: 1.0000 | ORAL_TABLET | Freq: Four times a day (QID) | ORAL | 0 refills | Status: AC | PRN

## 2021-03-24 NOTE — ED Provider Notes (Signed)
[facility] EMERGENCY DEPT Provider Note   CSN: [id number] Arrival date & time: 03/24/21  1038     History Chief Complaint  Patient presents with   Foot Pain    Jill Baker is a 40 y.o. female.  Patient is a 40 yo female presenting for foot pain. Pt states she hit right foot on cupboard last night injury her right 4th and 5th digits. Denies sensation or motor deficits. Denies breaks in skin.   The history is provided by the patient. No language interpreter was used.  Foot Pain Pertinent negatives include no chest pain and no shortness of breath.      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   Rotator cuff tear, left 12/26/2017   Traumatic partial tear of left biceps tendon 12/26/2017   Arthritis of left acromioclavicular joint 12/26/2017   Obesity, Class III, BMI 40-49.9 (morbid obesity) ([place]) 01/22/2017   Fatty liver 01/22/2017   Morbid obesity ([place]) 01/22/2017   Hand, foot and mouth disease 09/24/2014   Postpartum care following cesarean delivery (2/14) twins 08/17/2012   Twin pregnancy, delivered by cesarean section, current hospitalization 08/17/2012   Dichorionic diamniotic twin gestation 08/16/2012   Placenta previa with hemorrhage 05/22/2012   Twin pregnancy, twins dichorionic and diamniotic 05/22/2012   Cervical adenopathy    DYSPNEA ON EXERTION 10/11/2009   SHORTNESS OF BREATH 09/03/2009    PALPITATIONS 09/02/2009   ESOPHAGEAL STRICTURE 07/21/2009   DIVERTICULOSIS-COLON 07/21/2009   IRRITABLE BOWEL SYNDROME 07/21/2009   VITAMIN B12 DEFICIENCY 07/20/2009   SKIN RASH 07/08/2009   EDEMA 07/08/2009   RECTAL BLEEDING 05/21/2009   DIARRHEA 05/21/2009   ABDOMINAL PAIN-EPIGASTRIC 05/21/2009   ABDOMINAL PAIN -GENERALIZED 05/21/2009   GERD 03/28/2007   TACHYCARDIA 03/28/2007   Hypothyroidism 02/28/2007    Past Surgical History:  Procedure Laterality Date   Cervical lymph node Biopsy  02/24/2011   reactive only, per Dr. Georganna Skeans   CESAREAN SECTION N/A 08/16/2012   Procedure: CESAREAN SECTION;  Surgeon: Elveria Royals, MD;  Location: [facility];  Service: Obstetrics;  Laterality: N/A;   CHOLECYSTECTOMY     COLONOSCOPY W/ POLYPECTOMY  06/08/2009   FOOT SURGERY     LAPAROSCOPIC GASTRIC SLEEVE RESECTION WITH HIATAL HERNIA REPAIR N/A 01/22/2017   Procedure: LAPAROSCOPIC GASTRIC SLEEVE RESECTION WITH HIATAL HERNIA REPAIR;  Surgeon: Greer Pickerel, MD;  Location: [facility];  Service: General;  Laterality: N/A;   SHOULDER ARTHROSCOPY Left 12/26/2017   Procedure: ARTHROSCOPY LEFT SHOULDER ROTATOR CUFF REPAIR AND BICEPS TENODESIS, ACROMIIOPLASTY AND DISTAL CLAVICLE RESECTION;  Surgeon: Dorna Leitz, MD;  Location: [facility];  Service: Orthopedics;  Laterality: Left;   TONGUE BIOPSY     TONSILLECTOMY     UPPER GI ENDOSCOPY  01/22/2017   Procedure: UPPER GI ENDOSCOPY;  Surgeon: Greer Pickerel, MD;  Location: [facility];  Service: General;;  UPPER GI ENDOSCOPY  06/08/2009     OB History     Gravida  1   Para  1   Term  0   Preterm  1   AB  0   Living  2      SAB  0   IAB  0   Ectopic  0   Multiple  1   Live Births  2           Family History  Problem Relation Age of Onset   Heart disease Mother    Hypertension Mother    Breast cancer Maternal Grandmother     Social History   Tobacco Use   Smoking status: Never   Smokeless tobacco: Never   Vaping Use   Vaping Use: Never used  Substance Use Topics   Alcohol use: No    Alcohol/week: 0.0 standard drinks   Drug use: No    Home Medications Prior to Admission medications   Medication Sig Start Date End Date Taking? Authorizing Provider  amphetamine-dextroamphetamine (ADDERALL XR) 20 MG 24 hr capsule TAKE 1 CAPSULE (20 MG TOTAL) BY MOUTH EVERY MORNING. 08/15/19  Yes Laurey Morale, MD  atorvastatin (LIPITOR) 20 MG tablet TAKE 1 TABLET (20 MG TOTAL) BY MOUTH DAILY. 12/05/18  Yes Laurey Morale, MD  cetirizine (ZYRTEC) 10 MG tablet Take 10 mg by mouth daily.   Yes [provider]  levothyroxine (SYNTHROID) 100 MCG tablet TAKE 1 TABLET BY MOUTH ONCE DAILY. 05/17/20 05/17/21 Yes Nicholes Rough, PA-C  metoprolol succinate (TOPROL-XL) 100 MG 24 hr tablet TAKE 1 TABLET BY MOUTH DAILY. TAKE WITH OR IMMEDIATELY FOLLOWING A MEAL. 05/15/19  Yes Laurey Morale, MD  omeprazole (PRILOSEC) 40 MG capsule TAKE 1 CAPSULE BY MOUTH DAILY. 12/05/18  Yes Laurey Morale, MD  sertraline (ZOLOFT) 25 MG tablet TAKE 1 TABLET (25 MG TOTAL) BY MOUTH AT BEDTIME. 05/17/20 05/17/21 Yes Laurey Morale, MD  amphetamine-dextroamphetamine (ADDERALL XR) 20 MG 24 hr capsule TAKE 1 CAPSULE BY MOUTH ONCE DAILY IN THE MORNING. 07/12/20 01/08/21  Tomasa Hose, NP  amphetamine-dextroamphetamine (ADDERALL XR) 20 MG 24 hr capsule TAKE 1 CAPSULE BY MOUTH ONCE DAILY IN THE MORNING. DUE 05/17/20. 05/17/20 11/13/20  Nicholes Rough, PA-C  colchicine 0.6 MG tablet Take 0.5 tablets (0.3 mg total) by mouth every evening. Patient not taking: No sig reported 05/23/18   Laurey Morale, MD    Allergies    Chlorhexidine  Review of Systems   Review of Systems  Constitutional:  Negative for chills and fever.  Respiratory:  Negative for chest tightness and shortness of breath.   Cardiovascular:  Negative for chest pain and palpitations.  Skin:  Positive for wound. Negative for color change.  Neurological:  Negative for weakness and  numbness.   Physical Exam Updated Vital Signs BP 120/90   Pulse 76   Temp 98.3 F (36.8 C)   Resp 16   LMP 12/18/2017 (Exact Date)   SpO2 100%   Physical Exam Vitals and nursing note reviewed.  Constitutional:      Appearance: Normal appearance.  HENT:     Head: Normocephalic and atraumatic.  Cardiovascular:     Rate and Rhythm: Normal rate and regular rhythm.     Pulses:          Dorsalis pedis pulses are 2+ on the right side and 2+ on the left side.  Pulmonary:     Effort: Pulmonary effort is normal. No respiratory distress.  Musculoskeletal:     Right foot: Swelling and tenderness present. No deformity. Normal pulse.     Left foot: No deformity or tenderness. Normal pulse.     Comments: Ecchymosis of lateral dorsum of right foot.   Skin:    Capillary Refill: Capillary refill takes less than 2 seconds.  Neurological:     General: No focal deficit present.     Mental Status: She is alert and oriented to person, place, and time.     GCS: GCS eye subscore is 4. GCS verbal subscore is 5. GCS motor subscore is 6.     Sensory: Sensation is intact.     Motor: Motor function is intact.    ED Results / Procedures / Treatments   Labs (all labs ordered are listed, but only abnormal results are displayed) Labs Reviewed - No data to display  EKG None  Radiology DG Foot Complete Right  Result Date: 03/24/2021 CLINICAL DATA:  Right foot injury, pain EXAM: RIGHT FOOT COMPLETE - 3+ VIEW COMPARISON:  None. FINDINGS: There is an acute, oblique, extra-articular fracture of the distal diaphysis of the right fifth proximal phalanx with minimal override and lateral angulation of the distal fracture fragment. No other fracture identified. Joint spaces are preserved. Soft tissues are unremarkable. Small plantar calcaneal spur. Moderate os trigonum noted. IMPRESSION: Acute, oblique, minimally angulated extra articular fracture of the right fifth proximal phalanx Electronically Signed   By:  Fidela Salisbury M.D.   On: 03/24/2021 12:08    Procedures Procedures   Medications Ordered in ED Medications - No data to display  ED Course  I have reviewed the triage vital signs and the nursing notes.  Pertinent labs & imaging results that were available during my care of the patient were reviewed by me and considered in my medical decision making (see chart for details).    MDM Rules/Calculators/A&P   2:17 PM 40 yo female presenting for foot pain after injury. Pt Aox3, no acute distress, afebrile, with stable vitals. Physical exam demonstrates tenderness, swelling, and ecchymosis of lateral dorsum of right foot. Xray demonstrates  Acute, oblique, minimally angulated extra articular fracture of the right fifth proximal phalanx. Boot applied, motrin, non weight bearing recommended, crutches supplied.   Patient in no distress and overall condition improved here in the ED. Detailed discussions were had with the patient regarding current findings, and need for close f/u with orthopedic surgery in 5-7 days. The patient has been instructed to return immediately if the symptoms worsen in any way for re-evaluation. Patient verbalized understanding and is in agreement with current care plan. All questions answered prior to discharge.       Final Clinical Impression(s) / ED Diagnoses Final diagnoses:  Closed nondisplaced fracture of proximal phalanx of lesser toe of right foot, initial encounter    Rx / DC Orders ED Discharge Orders     None        Lianne Cure, DO 74/12/87 [id number]

## 2021-03-24 NOTE — Discharge Instructions (Signed)
Please follow up with orthopedic surgery in the next 5-7 days for fracture

## 2021-03-24 NOTE — ED Triage Notes (Signed)
Patient reports to the ER for right foot injury, discoloration and swelling noted to area. Reports she was trying to play with her kids.

## 2021-07-10 ENCOUNTER — Encounter (HOSPITAL_COMMUNITY): Payer: Self-pay | Admitting: Emergency Medicine

## 2021-07-10 ENCOUNTER — Emergency Department (HOSPITAL_COMMUNITY)
Admission: EM | Admit: 2021-07-10 | Discharge: 2021-07-11 | Disposition: A | Discharge status: Home / Self Care | Payer: Managed Care, Other (non HMO) | Attending: Emergency Medicine | Admitting: Emergency Medicine

## 2021-07-10 ENCOUNTER — Emergency Department (HOSPITAL_COMMUNITY): Payer: Managed Care, Other (non HMO)

## 2021-07-10 DIAGNOSIS — Z79899 Other long term (current) drug therapy: Secondary | ICD-10-CM | POA: Diagnosis not present

## 2021-07-10 DIAGNOSIS — R110 Nausea: Secondary | ICD-10-CM | POA: Insufficient documentation

## 2021-07-10 DIAGNOSIS — S52614A Nondisplaced fracture of right ulna styloid process, initial encounter for closed fracture: Secondary | ICD-10-CM | POA: Diagnosis not present

## 2021-07-10 DIAGNOSIS — S52501A Unspecified fracture of the lower end of right radius, initial encounter for closed fracture: Secondary | ICD-10-CM | POA: Insufficient documentation

## 2021-07-10 DIAGNOSIS — E039 Hypothyroidism, unspecified: Secondary | ICD-10-CM | POA: Insufficient documentation

## 2021-07-10 DIAGNOSIS — W19XXXA Unspecified fall, initial encounter: Secondary | ICD-10-CM

## 2021-07-10 DIAGNOSIS — W1839XA Other fall on same level, initial encounter: Secondary | ICD-10-CM | POA: Insufficient documentation

## 2021-07-10 DIAGNOSIS — S6991XA Unspecified injury of right wrist, hand and finger(s), initial encounter: Secondary | ICD-10-CM | POA: Diagnosis present

## 2021-07-10 MED ORDER — ONDANSETRON HCL 4 MG/2ML IJ SOLN: 4.0000 mg | Freq: Once | INTRAMUSCULAR | Status: DC

## 2021-07-10 MED ORDER — HYDROCODONE-ACETAMINOPHEN 5-325 MG PO TABS: 1.0000 | ORAL_TABLET | Freq: Four times a day (QID) | ORAL | 0 refills | Status: AC | PRN

## 2021-07-10 MED ORDER — HYDROMORPHONE HCL 1 MG/ML IJ SOLN
1.0000 mg | Freq: Once | INTRAMUSCULAR | Status: AC
  Administered 2021-07-10: 1 mg via INTRAVENOUS
  Filled 2021-07-10: qty 1

## 2021-07-10 MED ORDER — LIDOCAINE-EPINEPHRINE 2 %-1:200000 IJ SOLN
10.0000 mL | Freq: Once | INTRAMUSCULAR | Status: AC
Start: 2021-07-10 — End: 2021-07-10
  Administered 2021-07-10: 10 mL
  Filled 2021-07-10: qty 20

## 2021-07-10 MED ORDER — HYDROCODONE-ACETAMINOPHEN 5-325 MG PO TABS
1.0000 | ORAL_TABLET | Freq: Once | ORAL | Status: AC
  Administered 2021-07-10: 1 via ORAL
  Filled 2021-07-10: qty 1

## 2021-07-10 MED ORDER — ONDANSETRON 4 MG PO TBDP
4.0000 mg | ORAL_TABLET | Freq: Once | ORAL | Status: AC
  Administered 2021-07-10: 4 mg via ORAL
  Filled 2021-07-10: qty 1

## 2021-07-10 NOTE — ED Provider Notes (Signed)
[facility] EMERGENCY DEPARTMENT Provider Note   CSN: [id number] Arrival date & time: 07/10/21  1807     History  Chief Complaint  Patient presents with   Fall   Arm Injury    Jill Baker is a 41 y.o. female with a past medical history significant for hypothyroidism, vitamin B12 deficiency, and irritable bowel syndrome who presents to the ED due to right wrist pain.  Patient states she was putting up LED lights in her daughter's room when she fell on her right side.  Patient is unsure how exactly she landed.  Unsure whether or not she hit her head.  Denies loss of consciousness.  She is not currently on any blood thinners.  Patient endorses severe pain to her right wrist associated with edema.  Patient is right-hand dominant.  She also endorses some nausea which she attributes to her wrist pain.  HPI     Home Medications Prior to Admission medications   Medication Sig Start Date End Date Taking? Authorizing Provider  amphetamine-dextroamphetamine (ADDERALL XR) 20 MG 24 hr capsule TAKE 1 CAPSULE (20 MG TOTAL) BY MOUTH EVERY MORNING. 08/15/19  Yes Laurey Morale, MD  atorvastatin (LIPITOR) 20 MG tablet TAKE 1 TABLET (20 MG TOTAL) BY MOUTH DAILY. 12/05/18  Yes Laurey Morale, MD  cetirizine (ZYRTEC) 10 MG tablet Take 10 mg by mouth daily.   Yes [provider]  HYDROcodone-acetaminophen (NORCO/VICODIN) 5-325 MG tablet Take 1 tablet by mouth every 6 (six) hours as needed for severe pain. 07/10/21  Yes Velmer Broadfoot, Druscilla Brownie, PA-C  levothyroxine (SYNTHROID) 100 MCG tablet TAKE 1 TABLET BY MOUTH ONCE DAILY. 05/17/20 07/10/21 Yes Nicholes Rough, PA-C  metoprolol succinate (TOPROL-XL) 100 MG 24 hr tablet TAKE 1 TABLET BY MOUTH DAILY. TAKE WITH OR IMMEDIATELY FOLLOWING A MEAL. 05/15/19  Yes Laurey Morale, MD  omeprazole (PRILOSEC) 40 MG capsule TAKE 1 CAPSULE BY MOUTH DAILY. 12/05/18  Yes Laurey Morale, MD  sertraline (ZOLOFT) 25 MG tablet TAKE 1 TABLET (25 MG TOTAL) BY MOUTH  AT BEDTIME. 05/17/20 07/10/21 Yes Laurey Morale, MD  amphetamine-dextroamphetamine (ADDERALL XR) 20 MG 24 hr capsule TAKE 1 CAPSULE BY MOUTH ONCE DAILY IN THE MORNING. 07/12/20 01/08/21  Tomasa Hose, NP  amphetamine-dextroamphetamine (ADDERALL XR) 20 MG 24 hr capsule TAKE 1 CAPSULE BY MOUTH ONCE DAILY IN THE MORNING. DUE 05/17/20. 05/17/20 11/13/20  Nicholes Rough, PA-C  colchicine 0.6 MG tablet Take 0.5 tablets (0.3 mg total) by mouth every evening. Patient not taking: Reported on 03/24/2021 05/23/18   Laurey Morale, MD  HYDROcodone-acetaminophen [facility]) 5-325 MG tablet Take 1 tablet by mouth every 6 (six) hours as needed for moderate pain. Patient not taking: Reported on 07/10/2021 0/17/49   Campbell Stall P, DO  ibuprofen (ADVIL) 600 MG tablet Take 1 tablet (600 mg total) by mouth every 6 (six) hours as needed. Patient not taking: Reported on 07/10/2021 4/49/67   Campbell Stall P, DO      Allergies    Chlorhexidine    Review of Systems   Review of Systems  Gastrointestinal:  Positive for nausea.  Musculoskeletal:  Positive for arthralgias and joint swelling.  Neurological:  Negative for numbness.   Physical Exam Updated Vital Signs BP 114/76    Pulse 69    Temp 98.2 F (36.8 C) (Oral)    Resp 17    LMP 12/18/2017 (Exact Date)    SpO2 98%  Physical Exam Vitals and nursing note reviewed.  Constitutional:      General: She is not in acute distress.    Appearance: She is not ill-appearing.  HENT:     Head: Normocephalic.  Eyes:     Pupils: Pupils are equal, round, and reactive to light.  Cardiovascular:     Rate and Rhythm: Normal rate and regular rhythm.     Pulses: Normal pulses.     Heart sounds: Normal heart sounds. No murmur heard.   No friction rub. No gallop.  Pulmonary:     Effort: Pulmonary effort is normal.     Breath sounds: Normal breath sounds.  Abdominal:     General: Abdomen is flat. There is no distension.     Palpations: Abdomen is soft.     Tenderness: There is no  abdominal tenderness. There is no guarding or rebound.  Musculoskeletal:        General: Normal range of motion.     Cervical back: Neck supple.     Comments: Tenderness throughout right wrist with surrounding edema. Radial pulse intact. Able to move all fingers. Decreased ROM of right wrist due to pain.   Skin:    General: Skin is warm and dry.  Neurological:     General: No focal deficit present.     Mental Status: She is alert.  Psychiatric:        Mood and Affect: Mood normal.        Behavior: Behavior normal.    ED Results / Procedures / Treatments   Labs (all labs ordered are listed, but only abnormal results are displayed) Labs Reviewed - No data to display  EKG None  Radiology DG Wrist Complete Right  Result Date: 07/10/2021 CLINICAL DATA:  Fall onto outstretched chin, injury. EXAM: RIGHT WRIST - COMPLETE 3+ VIEW COMPARISON:  None. FINDINGS: Comminuted and displaced distal radial metaphyseal fracture. Fracture extends to the radiocarpal and distal radioulnar joint spaces. Apex volar angulation. Displaced ulna styloid fracture. Intact carpal bones. Soft tissue edema at the fracture site. IMPRESSION: 1. Comminuted and displaced angulated distal radial metaphyseal fracture extending to the radiocarpal and distal radioulnar joint. 2. Displaced ulna styloid fracture. Electronically Signed   By: Keith Rake M.D.   On: 07/10/2021 19:34   DG Hand Complete Right  Result Date: 07/10/2021 CLINICAL DATA:  Fall, injury. EXAM: RIGHT HAND - COMPLETE 3+ VIEW COMPARISON:  Concurrent wrist exam, reported separately. FINDINGS: Distal radius and ulna styloid fractures are better assessed on concurrent wrist exam. No additional fracture of the hand. Digit assessment on the lateral view is slightly limited due to osseous overlap. Alignment is maintained. Soft tissue edema seen at the wrist. IMPRESSION: Distal radius and ulna styloid fractures better assessed on concurrent wrist exam. No additional  fracture of the hand. Electronically Signed   By: Keith Rake M.D.   On: 07/10/2021 19:34    Procedures .Nerve Block  Date/Time: 07/10/2021 11:28 PM Performed by: Suzy Bouchard, PA-C Authorized by: Suzy Bouchard, PA-C   Consent:    Consent obtained:  Verbal   Consent given by:  Patient   Risks, benefits, and alternatives were discussed: yes     Risks discussed:  Infection, nerve damage, swelling, unsuccessful block, pain and bleeding   Alternatives discussed:  No treatment Universal protocol:    Procedure explained and questions answered to patient or proxy's satisfaction: yes     Relevant documents present and verified: yes     Imaging studies available: yes     Immediately prior  to procedure, a time out was called: yes     Patient identity confirmed:  Verbally with patient Indications:    Indications:  Pain relief Location:    Body area:  Upper extremity   Upper extremity nerve:  Radial   Laterality:  Right Pre-procedure details:    Skin preparation:  Alcohol   Preparation: Patient was prepped and draped in usual sterile fashion   Skin anesthesia:    Skin anesthesia method:  None Procedure details:    Block needle gauge:  25 G   Anesthetic injected:  Lidocaine 1% WITH epi   Steroid injected:  None   Additive injected:  None   Injection procedure:  Introduced needle   Paresthesia:  None Post-procedure details:    Dressing:  None   Outcome:  Pain relieved   Procedure completion:  Tolerated well, no immediate complications    Medications Ordered in ED Medications  lidocaine-EPINEPHrine (XYLOCAINE W/EPI) 2 %-1:200000 (PF) injection 10 mL (has no administration in time range)  HYDROmorphone (DILAUDID) injection 1 mg (has no administration in time range)  HYDROcodone-acetaminophen (NORCO/VICODIN) 5-325 MG per tablet 1 tablet (1 tablet Oral Given 07/10/21 1906)  ondansetron (ZOFRAN-ODT) disintegrating tablet 4 mg (4 mg Oral Given 07/10/21 1906)  HYDROmorphone  (DILAUDID) injection 1 mg (1 mg Intravenous Given 07/10/21 2218)    ED Course/ Medical Decision Making/ A&P                           Medical Decision Making Amount and/or Complexity of Data Reviewed Independent Historian:     Details: patient's mother at bedside Radiology: ordered and independent interpretation performed. Decision-making details documented in ED Course.  41 year old female presents to the ED due to right wrist pain after falling prior to arrival.  Patient is unsure how exactly she landed.  No loss of consciousness.  Patient is right-hand dominant.  Upon arrival, vitals all within normal limits.  Patient no acute distress.  Tenderness throughout right wrist with surrounding edema.  Right upper extremity neurovascularly intact with soft compartments.  Low suspicion for compartment syndrome.  Able to move all fingers.  Decreased range of motion of right wrist due to pain.  IV Dilaudid given.  X-ray ordered at triage which I personally reviewed and interpreted which demonstrates a radial and ulna fracture. Patient has previous seen Dr. Tawni Millers with [facility] back in September 2022.   9:56 PM Discussed x-ray results with Dr. Stann Mainland with hand surgery who recommends placing some traction on fracture prior to splinting. Patient can follow-up with office within the next week if prefers to go to them vs. [facility].   Hematoma block as noted above. Patient placed in finger traps to attempt and sugar tong splint placed. Patient discharged with Norco and orthopedics referral. Strict ED precautions discussed with patient. Patient states understanding and agrees to plan. Patient discharged home in no acute distress and stable vitals        Final Clinical Impression(s) / ED Diagnoses Final diagnoses:  Fall, initial encounter  Closed fracture of distal end of right radius, unspecified fracture morphology, initial encounter  Closed nondisplaced fracture of styloid process of right ulna,  initial encounter    Rx / DC Orders ED Discharge Orders          Ordered    HYDROcodone-acetaminophen (NORCO/VICODIN) 5-325 MG tablet  Every 6 hours PRN        07/10/21 2306  Karie Kirks 07/10/21 2332    Blanchie Dessert, MD 07/15/21 [id number]

## 2021-07-10 NOTE — ED Triage Notes (Signed)
Pt reports falling and deformity to right wrist.

## 2021-07-10 NOTE — Discharge Instructions (Signed)
It was a pleasure taking care of you today. As discussed, you broke both your radius and ulna. Call your orthopedic surgeon tomorrow to schedule an appointment for further evaluation. Keep splint on until evaluated. I am sending you home with hydrocodone. Take for severe pain. Use over the counter tylenol or ibuprofen as needed for mild to moderate pain. Return to the ER for new or worsening symptoms.

## 2021-07-10 NOTE — Progress Notes (Addendum)
Orthopedic Tech Progress Note Patient Details:  JAANA BRODT 03/05/1981 [id number]  Ortho Devices Type of Ortho Device: Arm sling, Sugartong splint Ortho Device/Splint Location: rue Ortho Device/Splint Interventions: Ordered, Application, Adjustment  Finger traps to gravity Post Interventions Patient Tolerated: Well Instructions Provided: Care of device, Adjustment of device  Karolee Stamps 07/10/2021, 11:56 PM

## 2021-07-10 NOTE — ED Provider Triage Note (Signed)
Emergency Medicine Provider Triage Evaluation Note  KADIA ABAYA , a 41 y.o. female  was evaluated in triage.  Pt complains of right wrist deformity 2/2 fall and injury just PTA. Starting to have tingling in her fingers. Endorses some nausea. Nothing for pain PTA.  Review of Systems  Positive: Arm pain, deformity, tingling Negative: bleeding  Physical Exam  BP 115/84 (BP Location: Left Arm)    Pulse 69    Temp 98.2 F (36.8 C) (Oral)    Resp 17    LMP 12/18/2017 (Exact Date)    SpO2 100%  Gen:   Awake, no distress   Resp:  Normal effort  MSK:   Some deformity, TTP noted right wrist, minimal motion of wrist, fingers can wiggle Other:  Intact Radial, Ulnar pulses  Medical Decision Making  Medically screening exam initiated at 7:00 PM.  Appropriate orders placed.  AGNESS SIBRIAN was informed that the remainder of the evaluation will be completed by another provider, this initial triage assessment does not replace that evaluation, and the importance of remaining in the ED until their evaluation is complete.  Workup initiated   Anselmo Pickler, [place] 07/10/21 5670

## 2021-08-19 ENCOUNTER — Encounter (HOSPITAL_COMMUNITY): Payer: Self-pay | Admitting: *Deleted

## 2022-08-14 ENCOUNTER — Encounter (HOSPITAL_COMMUNITY): Payer: Self-pay | Admitting: *Deleted

## 2024-04-30 ENCOUNTER — Other Ambulatory Visit (HOSPITAL_COMMUNITY): Payer: Self-pay

## 2024-04-30 MED ORDER — AMPHETAMINE-DEXTROAMPHET ER 20 MG PO CP24
20.0000 mg | ORAL_CAPSULE | Freq: Every morning | ORAL | 0 refills | Status: AC
  Filled 2024-04-30: qty 30, 30d supply, fill #0

## 2024-05-01 ENCOUNTER — Other Ambulatory Visit (HOSPITAL_COMMUNITY): Payer: Self-pay

## 2024-06-13 ENCOUNTER — Other Ambulatory Visit (HOSPITAL_COMMUNITY): Payer: Self-pay

## 2024-06-13 MED ORDER — AMPHETAMINE-DEXTROAMPHET ER 20 MG PO CP24
20.0000 mg | ORAL_CAPSULE | Freq: Every morning | ORAL | 0 refills | Status: AC
  Filled 2024-06-13: qty 30, 30d supply, fill #0
